# Patient Record
Sex: Female | Born: 1955 | Race: Asian | Hispanic: No | Marital: Married | Smoking: Never smoker
Health system: Southern US, Community
[De-identification: ages and names within clinical notes are randomized; demographics above are authoritative.]

## PROBLEM LIST (undated history)

## (undated) DIAGNOSIS — K8020 Calculus of gallbladder without cholecystitis without obstruction: Secondary | ICD-10-CM

## (undated) DIAGNOSIS — D509 Iron deficiency anemia, unspecified: Secondary | ICD-10-CM

## (undated) DIAGNOSIS — R718 Other abnormality of red blood cells: Secondary | ICD-10-CM

## (undated) DIAGNOSIS — E785 Hyperlipidemia, unspecified: Secondary | ICD-10-CM

## (undated) DIAGNOSIS — E119 Type 2 diabetes mellitus without complications: Secondary | ICD-10-CM

## (undated) DIAGNOSIS — M159 Polyosteoarthritis, unspecified: Secondary | ICD-10-CM

## (undated) DIAGNOSIS — R200 Anesthesia of skin: Secondary | ICD-10-CM

## (undated) DIAGNOSIS — R7303 Prediabetes: Secondary | ICD-10-CM

## (undated) HISTORY — DX: Hyperlipidemia, unspecified: E78.5

## (undated) HISTORY — DX: Prediabetes: R73.03

## (undated) HISTORY — PX: TUBAL LIGATION: SHX77

## (undated) HISTORY — DX: Iron deficiency anemia, unspecified: D50.9

## (undated) HISTORY — DX: Polyosteoarthritis, unspecified: M15.9

---
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
## 2015-05-16 ENCOUNTER — Encounter: Payer: Self-pay | Admitting: Family Medicine

## 2015-05-16 ENCOUNTER — Ambulatory Visit (INDEPENDENT_AMBULATORY_CARE_PROVIDER_SITE_OTHER): Payer: 59 | Admitting: Family Medicine

## 2015-05-16 VITALS — BP 110/72 | HR 75 | Temp 97.5°F | Resp 16 | Ht 60.0 in | Wt 170.3 lb

## 2015-05-16 DIAGNOSIS — M159 Polyosteoarthritis, unspecified: Secondary | ICD-10-CM | POA: Diagnosis not present

## 2015-05-16 DIAGNOSIS — Z9181 History of falling: Secondary | ICD-10-CM | POA: Diagnosis not present

## 2015-05-16 DIAGNOSIS — E669 Obesity, unspecified: Secondary | ICD-10-CM

## 2015-05-16 DIAGNOSIS — E785 Hyperlipidemia, unspecified: Secondary | ICD-10-CM

## 2015-05-16 DIAGNOSIS — D509 Iron deficiency anemia, unspecified: Secondary | ICD-10-CM

## 2015-05-16 DIAGNOSIS — Z6833 Body mass index (BMI) 33.0-33.9, adult: Secondary | ICD-10-CM | POA: Insufficient documentation

## 2015-05-16 MED ORDER — PRAVASTATIN SODIUM 20 MG PO TABS: 20.0000 mg | ORAL_TABLET | Freq: Every day | ORAL | Status: DC

## 2015-05-16 NOTE — Patient Instructions (Signed)
Fat and Cholesterol Control Diet Fat and cholesterol levels in your blood and organs are influenced by your diet. High levels of fat and cholesterol may lead to diseases of the heart, small and large blood vessels, gallbladder, liver, and pancreas. CONTROLLING FAT AND CHOLESTEROL WITH DIET Although exercise and lifestyle factors are important, your diet is key. That is because certain foods are known to raise cholesterol and others to lower it. The goal is to balance foods for their effect on cholesterol and more importantly, to replace saturated and trans fat with other types of fat, such as monounsaturated fat, polyunsaturated fat, and omega-3 fatty acids. On average, a person should consume no more than 15 to 17 g of saturated fat daily. Saturated and trans fats are considered "bad" fats, and they will raise LDL cholesterol. Saturated fats are primarily found in animal products such as meats, butter, and cream. However, that does not mean you need to give up all your favorite foods. Today, there are good tasting, low-fat, low-cholesterol substitutes for most of the things you like to eat. Choose low-fat or nonfat alternatives. Choose round or loin cuts of red meat. These types of cuts are lowest in fat and cholesterol. Chicken (without the skin), fish, veal, and ground turkey breast are great choices. Eliminate fatty meats, such as hot dogs and salami. Even shellfish have little or no saturated fat. Have a 3 oz (85 g) portion when you eat lean meat, poultry, or fish. Trans fats are also called "partially hydrogenated oils." They are oils that have been scientifically manipulated so that they are solid at room temperature resulting in a longer shelf life and improved taste and texture of foods in which they are added. Trans fats are found in stick margarine, some tub margarines, cookies, crackers, and baked goods.  When baking and cooking, oils are a great substitute for butter. The monounsaturated oils are  especially beneficial since it is believed they lower LDL and raise HDL. The oils you should avoid entirely are saturated tropical oils, such as coconut and palm.  Remember to eat a lot from food groups that are naturally free of saturated and trans fat, including fish, fruit, vegetables, beans, grains (barley, rice, couscous, bulgur wheat), and pasta (without cream sauces).  IDENTIFYING FOODS THAT LOWER FAT AND CHOLESTEROL  Soluble fiber may lower your cholesterol. This type of fiber is found in fruits such as apples, vegetables such as broccoli, potatoes, and carrots, legumes such as beans, peas, and lentils, and grains such as barley. Foods fortified with plant sterols (phytosterol) may also lower cholesterol. You should eat at least 2 g per day of these foods for a cholesterol lowering effect.  Read package labels to identify low-saturated fats, trans fat free, and low-fat foods at the supermarket. Select cheeses that have only 2 to 3 g saturated fat per ounce. Use a heart-healthy tub margarine that is free of trans fats or partially hydrogenated oil. When buying baked goods (cookies, crackers), avoid partially hydrogenated oils. Breads and muffins should be made from whole grains (whole-wheat or whole oat flour, instead of "flour" or "enriched flour"). Buy non-creamy canned soups with reduced salt and no added fats.  FOOD PREPARATION TECHNIQUES  Never deep-fry. If you must fry, either stir-fry, which uses very little fat, or use non-stick cooking sprays. When possible, broil, bake, or roast meats, and steam vegetables. Instead of putting butter or margarine on vegetables, use lemon and herbs, applesauce, and cinnamon (for squash and sweet potatoes). Use nonfat   yogurt, salsa, and low-fat dressings for salads.  LOW-SATURATED FAT / LOW-FAT FOOD SUBSTITUTES Meats / Saturated Fat (g)  Avoid: Steak, marbled (3 oz/85 g) / 11 g  Choose: Steak, lean (3 oz/85 g) / 4 g  Avoid: Hamburger (3 oz/85 g) / 7  g  Choose: Hamburger, lean (3 oz/85 g) / 5 g  Avoid: Ham (3 oz/85 g) / 6 g  Choose: Ham, lean cut (3 oz/85 g) / 2.4 g  Avoid: Chicken, with skin, dark meat (3 oz/85 g) / 4 g  Choose: Chicken, skin removed, dark meat (3 oz/85 g) / 2 g  Avoid: Chicken, with skin, light meat (3 oz/85 g) / 2.5 g  Choose: Chicken, skin removed, light meat (3 oz/85 g) / 1 g Dairy / Saturated Fat (g)  Avoid: Whole milk (1 cup) / 5 g  Choose: Low-fat milk, 2% (1 cup) / 3 g  Choose: Low-fat milk, 1% (1 cup) / 1.5 g  Choose: Skim milk (1 cup) / 0.3 g  Avoid: Hard cheese (1 oz/28 g) / 6 g  Choose: Skim milk cheese (1 oz/28 g) / 2 to 3 g  Avoid: Cottage cheese, 4% fat (1 cup) / 6.5 g  Choose: Low-fat cottage cheese, 1% fat (1 cup) / 1.5 g  Avoid: Ice cream (1 cup) / 9 g  Choose: Sherbet (1 cup) / 2.5 g  Choose: Nonfat frozen yogurt (1 cup) / 0.3 g  Choose: Frozen fruit bar / trace  Avoid: Whipped cream (1 tbs) / 3.5 g  Choose: Nondairy whipped topping (1 tbs) / 1 g Condiments / Saturated Fat (g)  Avoid: Mayonnaise (1 tbs) / 2 g  Choose: Low-fat mayonnaise (1 tbs) / 1 g  Avoid: Butter (1 tbs) / 7 g  Choose: Extra light margarine (1 tbs) / 1 g  Avoid: Coconut oil (1 tbs) / 11.8 g  Choose: Olive oil (1 tbs) / 1.8 g  Choose: Corn oil (1 tbs) / 1.7 g  Choose: Safflower oil (1 tbs) / 1.2 g  Choose: Sunflower oil (1 tbs) / 1.4 g  Choose: Soybean oil (1 tbs) / 2.4 g  Choose: Canola oil (1 tbs) / 1 g Document Released: 11/03/2005 Document Revised: 02/28/2013 Document Reviewed: 02/01/2014 [employer] Patient Information 2015 [employer]. This information is not intended to replace advice given to you by your health care provider. Make sure you discuss any questions you have with your health care provider.  

## 2015-05-16 NOTE — Progress Notes (Signed)
Name: Kristine Alexander   MRN: [id number]    DOB: 04-Jul-1956   Date:05/16/2015       Progress Note  Subjective  Chief Complaint  Chief Complaint  Patient presents with  . Establish Care  . Referral    HPI  Mrs. Kristine Alexander is here today along with her daughter to establish care. She is of [place] background but has been living in the [place] nearly [place] years. She lives with her daughters, son in laws and grand children. She continues to be active in her house hold chores, still cooking for all of her family. Despite her generalized osteoarthritic pain located in back, hands, knees, feet she continues to clean and cook and tend to her grandchildren and family which she enjoys doing. Recently she has been seeing a ChiropodistChiropracter and needs an official referral to continue seeing him. She uses Meloxicam 15mg  one a day to help with her joint pain which she states helps. Otherwise she was recently diagnosed with iron deficiency and high cholesterol by her previous provider and has started daily iron therapy but not cholesterol medication.   Past Medical History  Diagnosis Date  . Osteoarthritis of multiple joints   . Hyperlipidemia   . Iron deficiency anemia     History reviewed. No pertinent past surgical history.  Family History  Problem Relation Age of Onset  . Hyperlipidemia Mother   . Hyperlipidemia Father   . Hyperlipidemia Sister   . Diabetes Brother   . Hyperlipidemia Brother     History   Social History  . Marital Status: Married    Spouse Name: N/A  . Number of Children: N/A  . Years of Education: N/A   Occupational History  . Not on file.   Social History Main Topics  . Smoking status: Never Smoker   . Smokeless tobacco: Not on file  . Alcohol Use: No  . Drug Use: No  . Sexual Activity: No   Other Topics Concern  . Not on file   Social History Narrative  . No narrative on file     Current outpatient prescriptions:  .  ferrous sulfate 325 (65 FE)  MG tablet, Take 325 mg by mouth daily with breakfast., Disp: , Rfl:  .  meloxicam (MOBIC) 15 MG tablet, Take 15 mg by mouth daily., Disp: , Rfl:  .  pravastatin (PRAVACHOL) 20 MG tablet, Take 1 tablet (20 mg total) by mouth at bedtime., Disp: 90 tablet, Rfl: 1  No Known Allergies   ROS  CONSTITUTIONAL: No significant weight changes, fever, chills, weakness or fatigue.  HEENT:  - Eyes: No visual changes.  - Ears: No auditory changes. No pain.  - Nose: No sneezing, congestion, runny nose. - Throat: No sore throat. No changes in swallowing. SKIN: No rash or itching.  CARDIOVASCULAR: No chest pain, chest pressure or chest discomfort. No palpitations or edema.  RESPIRATORY: No shortness of breath, cough or sputum.  GASTROINTESTINAL: No anorexia, nausea, vomiting. No changes in bowel habits. No abdominal pain or blood.  GENITOURINARY: No dysuria. No frequency. No discharge.  NEUROLOGICAL: No headache, dizziness, syncope, paralysis, ataxia, numbness or tingling in the extremities. No memory changes. No change in bowel or bladder control.  MUSCULOSKELETAL: Yes joint pain. No muscle pain. HEMATOLOGIC: No anemia, bleeding or bruising.  LYMPHATICS: No enlarged lymph nodes.  PSYCHIATRIC: No change in mood. No change in sleep pattern.  ENDOCRINOLOGIC: No reports of sweating, cold or heat intolerance. No polyuria or polydipsia.   Objective  Filed Vitals:   05/16/15 1537  BP: 110/72  Pulse: 75  Temp: 97.5 F (36.4 C)  TempSrc: Oral  Resp: 16  Height: 5' (1.524 m)  Weight: 170 lb 4.8 oz (77.248 kg)  SpO2: 96%   Body mass index is 33.26 kg/(m^2).  Physical Exam  Constitutional: Patient appears well-developed and well-nourished. In no distress.  HEENT:  - Head: Normocephalic and atraumatic.  - Ears: Bilateral TMs gray, no erythema or effusion - Nose: Nasal mucosa moist - Mouth/Throat: Oropharynx is clear and moist. No tonsillar hypertrophy or erythema. No post nasal drainage.  -  Eyes: Conjunctivae clear, EOM movements normal. PERRLA. No scleral icterus.  Neck: Normal range of motion. Neck supple. No JVD present. No thyromegaly present.  Cardiovascular: Normal rate, regular rhythm with a 2/6 SEM. Pulmonary/Chest: Effort normal and breath sounds normal. No respiratory distress. Musculoskeletal: Normal range of motion bilateral UE and LE, no joint effusions. Peripheral vascular: Bilateral LE no edema. Neurological: CN II-XII grossly intact with no focal deficits. Alert and oriented to person, place, and time. Coordination, balance, strength, speech and gait are normal.  Skin: Skin is warm and dry. No rash noted. No erythema.  Psychiatric: Patient has a normal mood and affect. Behavior is normal in office today. Judgment and thought content normal in office today.   Assessment & Plan  1. Obesity, Class I, BMI 30-34.9 The patient has been counseled on their higher than normal BMI.  They have verbally expressed understanding their increased risk for other diseases.  In efforts to meet a better target BMI goal the patient has been counseled on lifestyle, diet and exercise modification tactics. Start with moderate intensity aerobic exercise (walking, jogging, elliptical, swimming, group or individual sports, hiking) at least a day at least 4 days a week and increase intensity, duration, frequency as tolerated. Diet should include well balance fresh fruits and vegetables avoiding processed foods, carbohydrates and sugars. Drink at least 8oz 10 glasses a day avoiding sodas, sugary fruit drinks, sweetened tea. Check weight on a reliable scale daily and monitor weight loss progress daily. Consider investing in mobile phone apps that will help keep track of weight loss goals.   2. Iron deficiency anemia Continue daily iron.   - Ferritin - Iron - Iron and TIBC - CBC with Differential/Platelet  3. Hyperlipidemia LDL goal <100 The patient has been counseled on the proper  use, side effects and potential interactions of the new medication. Patient encouraged to review the side effects and safety profile pamphlet provided with the prescription from the pharmacy as well as request counseling from the pharmacy team as needed.   - pravastatin (PRAVACHOL) 20 MG tablet; Take 1 tablet (20 mg total) by mouth at bedtime.  Dispense: 90 tablet; Refill: 1  4. Osteoarthrosis, generalized, multiple joints Will rule out RA at next lab work.  - Ambulatory referral to Chiropractic  5. History of fall Likely complicated by OA of multiple joints. Will eventually need to slow down with house hold tasks.

## 2015-05-17 LAB — CBC WITH DIFFERENTIAL/PLATELET
BASOS ABS: 0 10*3/uL (ref 0.0–0.2)
Basos: 0 %
EOS (ABSOLUTE): 0.2 10*3/uL (ref 0.0–0.4)
Eos: 2 %
HEMATOCRIT: 35.3 % (ref 34.0–46.6)
HEMOGLOBIN: 11.5 g/dL (ref 11.1–15.9)
Immature Grans (Abs): 0 10*3/uL (ref 0.0–0.1)
Immature Granulocytes: 0 %
Lymphocytes Absolute: 3.1 10*3/uL (ref 0.7–3.1)
Lymphs: 36 %
MCH: 25.6 pg — ABNORMAL LOW (ref 26.6–33.0)
MCHC: 32.6 g/dL (ref 31.5–35.7)
MCV: 78 fL — ABNORMAL LOW (ref 79–97)
MONOCYTES: 8 %
MONOS ABS: 0.7 10*3/uL (ref 0.1–0.9)
NEUTROS ABS: 4.7 10*3/uL (ref 1.4–7.0)
Neutrophils: 54 %
PLATELETS: 262 10*3/uL (ref 150–379)
RBC: 4.5 x10E6/uL (ref 3.77–5.28)
RDW: 15.2 % (ref 12.3–15.4)
WBC: 8.7 10*3/uL (ref 3.4–10.8)

## 2015-05-17 LAB — IRON AND TIBC
IRON SATURATION: 22 % (ref 15–55)
IRON: 71 ug/dL (ref 27–159)
Total Iron Binding Capacity: 321 ug/dL (ref 250–450)
UIBC: 250 ug/dL (ref 131–425)

## 2015-05-17 LAB — FERRITIN: Ferritin: 71 ng/mL (ref 15–150)

## 2015-09-18 ENCOUNTER — Ambulatory Visit
Admission: RE | Admit: 2015-09-18 | Discharge: 2015-09-18 | Disposition: A | Discharge status: Home / Self Care | Payer: 59 | Source: Ambulatory Visit | Attending: Family Medicine | Admitting: Family Medicine

## 2015-09-18 ENCOUNTER — Ambulatory Visit (INDEPENDENT_AMBULATORY_CARE_PROVIDER_SITE_OTHER): Payer: 59 | Admitting: Family Medicine

## 2015-09-18 ENCOUNTER — Encounter: Payer: Self-pay | Admitting: Family Medicine

## 2015-09-18 VITALS — BP 108/56 | HR 97 | Temp 97.5°F | Resp 16 | Wt 170.8 lb

## 2015-09-18 DIAGNOSIS — M545 Low back pain, unspecified: Secondary | ICD-10-CM | POA: Insufficient documentation

## 2015-09-18 DIAGNOSIS — R011 Cardiac murmur, unspecified: Secondary | ICD-10-CM

## 2015-09-18 DIAGNOSIS — Z1231 Encounter for screening mammogram for malignant neoplasm of breast: Secondary | ICD-10-CM

## 2015-09-18 DIAGNOSIS — M159 Polyosteoarthritis, unspecified: Secondary | ICD-10-CM

## 2015-09-18 DIAGNOSIS — E785 Hyperlipidemia, unspecified: Secondary | ICD-10-CM | POA: Diagnosis not present

## 2015-09-18 DIAGNOSIS — D509 Iron deficiency anemia, unspecified: Secondary | ICD-10-CM | POA: Diagnosis not present

## 2015-09-18 DIAGNOSIS — Z0001 Encounter for general adult medical examination with abnormal findings: Secondary | ICD-10-CM

## 2015-09-18 DIAGNOSIS — Z0000 Encounter for general adult medical examination without abnormal findings: Secondary | ICD-10-CM | POA: Insufficient documentation

## 2015-09-18 NOTE — Progress Notes (Signed)
Name: Kristine Alexander   MRN: [id number]    DOB: [birth year]   Date:09/18/2015       Progress Note  Subjective  Chief Complaint  Chief Complaint  Patient presents with  . Annual Exam    HPI  Patient is here today for a Complete Female Physical Exam:  The patient has usual complaint of back pain, shoulder pain, hip pain for which she takes Meloxicam 15 mg a day. This was previously effective but not as much any more. Daughter states she also complains of being tired and gets swelling in her lower extremities at times. Diet is not well balanced. Taking statin medication, iron supplement and meloxicam as instructed. In general does not exercise regularly but still prepares meals for family and does house work. Sees dentist regularly and addresses vision concerns with ophthalmologist if applicable. In regards to sexual activity the patient is not currently sexually active. Currently is not concerned about exposure to any STDs. Declines to have pelvic exam. Menstrual history is positive for menopause.   Past Medical History  Diagnosis Date  . Osteoarthritis of multiple joints   . Hyperlipidemia   . Iron deficiency anemia     History reviewed. No pertinent past surgical history.  Family History  Problem Relation Age of Onset  . Hyperlipidemia Mother   . Hyperlipidemia Father   . Hyperlipidemia Sister   . Diabetes Brother   . Hyperlipidemia Brother     Social History   Social History  . Marital Status: Married    Spouse Name: N/A  . Number of Children: N/A  . Years of Education: N/A   Occupational History  . Not on file.   Social History Main Topics  . Smoking status: Never Smoker   . Smokeless tobacco: Not on file  . Alcohol Use: No  . Drug Use: No  . Sexual Activity: No   Other Topics Concern  . Not on file   Social History Narrative     Current outpatient prescriptions:  .  ferrous sulfate 325 (65 FE) MG tablet, Take 325 mg by mouth daily with breakfast., Disp:  , Rfl:  .  meloxicam (MOBIC) 15 MG tablet, Take 15 mg by mouth daily., Disp: , Rfl:  .  pravastatin (PRAVACHOL) 20 MG tablet, Take 1 tablet (20 mg total) by mouth at bedtime., Disp: 90 tablet, Rfl: 1  No Known Allergies  ROS  CONSTITUTIONAL: No significant weight changes, fever, chills, weakness. Yes fatigue.  HEENT:  - Eyes: No visual changes.  - Ears: No auditory changes. No pain.  - Nose: No sneezing, congestion, runny nose. - Throat: No sore throat. No changes in swallowing. SKIN: No rash or itching.  CARDIOVASCULAR: No chest pain, chest pressure or chest discomfort. No palpitations. Yes intermittent edema.  RESPIRATORY: No shortness of breath, cough or sputum.  GASTROINTESTINAL: No anorexia, nausea, vomiting. No changes in bowel habits. No abdominal pain or blood.  GENITOURINARY: No dysuria. No frequency. No discharge.  NEUROLOGICAL: No headache, dizziness, syncope, paralysis, ataxia, numbness or tingling in the extremities. No memory changes. No change in bowel or bladder control.  MUSCULOSKELETAL: Yes joint pain. No muscle pain. HEMATOLOGIC: No anemia, bleeding or bruising.  LYMPHATICS: No enlarged lymph nodes.  PSYCHIATRIC: No change in mood. No change in sleep pattern.  ENDOCRINOLOGIC: No reports of sweating, cold or heat intolerance. No polyuria or polydipsia.   Objective  Filed Vitals:   09/18/15 1345  BP: 108/56  Pulse: 97  Temp: 97.5 F (36.4 C)  TempSrc: Oral  Resp: 16  Weight: 170 lb 12.8 oz (77.474 kg)  SpO2: 96%   Body mass index is 33.36 kg/(m^2).  Physical Exam  Constitutional: Patient appears well-developed and well-nourished. In no distress.  HEENT:  - Head: Normocephalic and atraumatic.  - Ears: Bilateral TMs gray, no erythema or effusion - Nose: Nasal mucosa moist - Mouth/Throat: Oropharynx is clear and moist. No tonsillar hypertrophy or erythema. No post nasal drainage.  - Eyes: Conjunctivae clear, EOM movements normal. PERRLA. No scleral  icterus.  Neck: Normal range of motion. Neck supple. No JVD present. No thyromegaly present.  Cardiovascular: Normal rate, regular rhythm with a 3/6 SEM best heard patient's right of the sternum. Pulmonary/Chest: Effort normal and breath sounds normal. No respiratory distress. Abdominal: Soft. Bowel sounds are normal, no distension. There is no tenderness. no masses BREAST: Bilateral breast exam normal with no masses, skin changes or nipple discharge FEMALE GENITALIA: exam declined by patient RECTAL: exam declined by patient Musculoskeletal: Normal range of motion bilateral UE and LE, no joint effusions. Peripheral vascular: Bilateral LE no edema. Neurological: CN II-XII grossly intact with no focal deficits. Alert and oriented to person, place, and time. Coordination, balance, strength, speech and gait are normal.  Skin: Skin is warm and dry. No rash noted. No erythema.  Psychiatric: Patient has a normal mood and affect. Behavior is normal in office today. Judgment and thought content normal in office today.   Assessment & Plan   1. Annual physical exam PAP testing declined. Discussed risk and benefits with daughter and patient.   2. Encounter for screening mammogram for malignant neoplasm of breast  - MM Digital Screening; Future  3. Osteoarthrosis, generalized, multiple joints  - DG Lumbar Spine Complete; Future  4. Left-sided low back pain without sciatica  - DG Lumbar Spine Complete; Future  5. Heart murmur New finding. Recommended cardiology evaluation.  - Comprehensive metabolic panel - TSH - Ambulatory referral Cardiology  6. Hyperlipidemia LDL goal <100 Recheck FLP.   - Lipid panel  7. Iron deficiency anemia Recheck lab work.  - CBC with Differential/Platelet - Comprehensive metabolic panel - Ferritin - Iron - Iron and TIBC

## 2015-09-20 LAB — CBC WITH DIFFERENTIAL/PLATELET
BASOS: 0 %
Basophils Absolute: 0 10*3/uL (ref 0.0–0.2)
EOS (ABSOLUTE): 0.1 10*3/uL (ref 0.0–0.4)
Eos: 2 %
Hematocrit: 35.2 % (ref 34.0–46.6)
Hemoglobin: 11.9 g/dL (ref 11.1–15.9)
IMMATURE GRANS (ABS): 0 10*3/uL (ref 0.0–0.1)
IMMATURE GRANULOCYTES: 0 %
LYMPHS: 38 %
Lymphocytes Absolute: 2.6 10*3/uL (ref 0.7–3.1)
MCH: 26 pg — AB (ref 26.6–33.0)
MCHC: 33.8 g/dL (ref 31.5–35.7)
MCV: 77 fL — ABNORMAL LOW (ref 79–97)
MONOCYTES: 6 %
Monocytes Absolute: 0.4 10*3/uL (ref 0.1–0.9)
NEUTROS PCT: 54 %
Neutrophils Absolute: 3.6 10*3/uL (ref 1.4–7.0)
Platelets: 291 10*3/uL (ref 150–379)
RBC: 4.57 x10E6/uL (ref 3.77–5.28)
RDW: 14.4 % (ref 12.3–15.4)
WBC: 6.8 10*3/uL (ref 3.4–10.8)

## 2015-09-20 LAB — COMPREHENSIVE METABOLIC PANEL
A/G RATIO: 1.2 (ref 1.1–2.5)
ALBUMIN: 4 g/dL (ref 3.5–5.5)
ALT: 20 IU/L (ref 0–32)
AST: 21 IU/L (ref 0–40)
Alkaline Phosphatase: 58 IU/L (ref 39–117)
BUN / CREAT RATIO: 17 (ref 9–23)
BUN: 11 mg/dL (ref 6–24)
Bilirubin Total: 0.3 mg/dL (ref 0.0–1.2)
CALCIUM: 9.3 mg/dL (ref 8.7–10.2)
CO2: 24 mmol/L (ref 18–29)
CREATININE: 0.63 mg/dL (ref 0.57–1.00)
Chloride: 100 mmol/L (ref 97–106)
GFR, EST AFRICAN AMERICAN: 114 mL/min/{1.73_m2} (ref >59)
GFR, EST NON AFRICAN AMERICAN: 98 mL/min/{1.73_m2} (ref >59)
GLOBULIN, TOTAL: 3.4 g/dL (ref 1.5–4.5)
Glucose: 92 mg/dL (ref 65–99)
POTASSIUM: 4.4 mmol/L (ref 3.5–5.2)
SODIUM: 139 mmol/L (ref 136–144)
TOTAL PROTEIN: 7.4 g/dL (ref 6.0–8.5)

## 2015-09-20 LAB — LIPID PANEL
CHOLESTEROL TOTAL: 178 mg/dL (ref 100–199)
Chol/HDL Ratio: 3.8 ratio units (ref 0.0–4.4)
HDL: 47 mg/dL (ref >39)
LDL Calculated: 118 mg/dL — ABNORMAL HIGH (ref 0–99)
TRIGLYCERIDES: 65 mg/dL (ref 0–149)
VLDL Cholesterol Cal: 13 mg/dL (ref 5–40)

## 2015-09-20 LAB — IRON AND TIBC
IRON SATURATION: 22 % (ref 15–55)
IRON: 70 ug/dL (ref 27–159)
Total Iron Binding Capacity: 318 ug/dL (ref 250–450)
UIBC: 248 ug/dL (ref 131–425)

## 2015-09-20 LAB — TSH: TSH: 3.06 u[IU]/mL (ref 0.450–4.500)

## 2015-09-20 LAB — FERRITIN: FERRITIN: 103 ng/mL (ref 15–150)

## 2015-09-21 ENCOUNTER — Telehealth: Payer: Self-pay

## 2015-09-21 NOTE — Telephone Encounter (Signed)
Please call the daughter with her moms results when you get time. Thanks

## 2015-09-21 NOTE — Telephone Encounter (Signed)
Daughter returned my call and labs were reviewed after she verified her mom's date of birth. A copy of the labs were mailed to their home address.

## 2015-09-21 NOTE — Telephone Encounter (Signed)
Tried to call her daughter but the line was busy. I was not able to leave a message.

## 2015-10-02 DIAGNOSIS — R600 Localized edema: Secondary | ICD-10-CM

## 2015-10-03 DIAGNOSIS — R0602 Shortness of breath: Secondary | ICD-10-CM

## 2015-12-26 ENCOUNTER — Ambulatory Visit (INDEPENDENT_AMBULATORY_CARE_PROVIDER_SITE_OTHER): Payer: BLUE CROSS/BLUE SHIELD | Admitting: Family Medicine

## 2015-12-26 ENCOUNTER — Encounter: Payer: Self-pay | Admitting: Family Medicine

## 2015-12-26 VITALS — BP 114/82 | HR 89 | Temp 98.1°F | Resp 16 | Wt 171.0 lb

## 2015-12-26 DIAGNOSIS — J40 Bronchitis, not specified as acute or chronic: Secondary | ICD-10-CM | POA: Diagnosis not present

## 2015-12-26 DIAGNOSIS — Z8709 Personal history of other diseases of the respiratory system: Secondary | ICD-10-CM | POA: Insufficient documentation

## 2015-12-26 DIAGNOSIS — Z8619 Personal history of other infectious and parasitic diseases: Secondary | ICD-10-CM

## 2015-12-26 DIAGNOSIS — J209 Acute bronchitis, unspecified: Secondary | ICD-10-CM | POA: Insufficient documentation

## 2015-12-26 MED ORDER — LEVOFLOXACIN 750 MG PO TABS: 750.0000 mg | ORAL_TABLET | Freq: Every day | ORAL | Status: DC

## 2015-12-26 MED ORDER — BENZONATATE 200 MG PO CAPS: 200.0000 mg | ORAL_CAPSULE | Freq: Three times a day (TID) | ORAL | Status: DC | PRN

## 2015-12-26 NOTE — Patient Instructions (Signed)
1) Call if symptoms not getting better in 1 week, Doctor will order chest x-ray then

## 2015-12-26 NOTE — Progress Notes (Signed)
Name: Kristine Alexander   MRN: [id number]    DOB: [birth year]   Date:12/26/2015       Progress Note  Subjective  Chief Complaint  Chief Complaint  Patient presents with  . URI    HPI  Patient is here today along with daughter who is caretaker and interpreter with concerns regarding the following symptoms sore throat, congestion, sneezing, ear pressure, productive cough and low grade fevers that started 3 weeks ago.  Associated with chills, sweats, fatigue and anorexia.  Has tried the following home remedies: Went to urgent care and she was diagnosed with the flu, given tamiflu but her respiratory symptoms persist. Fevers gone now.    Past Medical History  Diagnosis Date  . Osteoarthritis of multiple joints   . Hyperlipidemia   . Iron deficiency anemia     Social History  Substance Use Topics  . Smoking status: Never Smoker   . Smokeless tobacco: Not on file  . Alcohol Use: No     Current outpatient prescriptions:  .  ferrous sulfate 325 (65 FE) MG tablet, Take 325 mg by mouth daily with breakfast., Disp: , Rfl:  .  meloxicam (MOBIC) 15 MG tablet, Take 15 mg by mouth daily., Disp: , Rfl:  .  pravastatin (PRAVACHOL) 20 MG tablet, Take 1 tablet (20 mg total) by mouth at bedtime., Disp: 90 tablet, Rfl: 1  No Known Allergies  ROS  Positive for fatigue, nasal congestion, sinus pressure, ear fullness, cough as mentioned in HPI, otherwise all systems reviewed and are negative.  Objective  Filed Vitals:   12/26/15 1607  BP: 114/82  Pulse: 89  Temp: 98.1 F (36.7 C)  TempSrc: Oral  Resp: 16  Weight: 171 lb (77.565 kg)  SpO2: 96%   Body mass index is 33.4 kg/(m^2).   Physical Exam  Constitutional: Patient is obese and well-nourished. In no acute distress but does appear to be fatigued from acute illness. HEENT:  - Head: Normocephalic and atraumatic.  - Ears: RIGHT TM bulging with minimal clear exudate, LEFT TM bulging with minimal clear exudate.  - Nose: Nasal mucosa  boggy and congested.  - Mouth/Throat: Oropharynx is moist with slight erythema of bilateral tonsils without hypertrophy or exudates. Post nasal drainage present.  - Eyes: Conjunctivae clear, EOM movements normal. PERRLA. No scleral icterus.  Neck: Normal range of motion. Neck supple. No JVD present. No thyromegaly present. No local lymphadenopathy. Cardiovascular: Regular rate, regular rhythm with no murmurs heard.  Pulmonary/Chest: Effort normal and breath sounds coarse with reduced tidal volume and no rhonchi or wheezing. Musculoskeletal: Normal range of motion bilateral UE and LE, no joint effusions. Skin: Skin is warm and dry. No rash noted. Psychiatric: Patient has a normal mood and affect. Behavior is normal in office today. Judgment and thought content normal in office today.   Assessment & Plan  1. Bronchitis with bronchospasm Treat as post influenza pneumonia.  - CBC with Differential/Platelet - levofloxacin (LEVAQUIN) 750 MG tablet; Take 1 tablet (750 mg total) by mouth daily.  Dispense: 10 tablet; Refill: 0 - benzonatate (TESSALON) 200 MG capsule; Take 1 capsule (200 mg total) by mouth 3 (three) times daily as needed for cough.  Dispense: 30 capsule; Refill: 0  2. History of influenza I recommended CXR, declined by patient and daughter but they were willing to get CBC.

## 2015-12-27 LAB — CBC WITH DIFFERENTIAL/PLATELET
BASOS: 0 %
Basophils Absolute: 0 10*3/uL (ref 0.0–0.2)
EOS (ABSOLUTE): 0.1 10*3/uL (ref 0.0–0.4)
EOS: 2 %
HEMATOCRIT: 34.9 % (ref 34.0–46.6)
HEMOGLOBIN: 11.9 g/dL (ref 11.1–15.9)
IMMATURE GRANS (ABS): 0 10*3/uL (ref 0.0–0.1)
IMMATURE GRANULOCYTES: 0 %
LYMPHS: 50 %
Lymphocytes Absolute: 3.6 10*3/uL — ABNORMAL HIGH (ref 0.7–3.1)
MCH: 26.3 pg — ABNORMAL LOW (ref 26.6–33.0)
MCHC: 34.1 g/dL (ref 31.5–35.7)
MCV: 77 fL — ABNORMAL LOW (ref 79–97)
Monocytes Absolute: 0.5 10*3/uL (ref 0.1–0.9)
Monocytes: 6 %
NEUTROS PCT: 42 %
Neutrophils Absolute: 3 10*3/uL (ref 1.4–7.0)
Platelets: 234 10*3/uL (ref 150–379)
RBC: 4.52 x10E6/uL (ref 3.77–5.28)
RDW: 14.7 % (ref 12.3–15.4)
WBC: 7.3 10*3/uL (ref 3.4–10.8)

## 2016-02-01 ENCOUNTER — Ambulatory Visit (INDEPENDENT_AMBULATORY_CARE_PROVIDER_SITE_OTHER): Payer: BLUE CROSS/BLUE SHIELD | Admitting: Family Medicine

## 2016-02-01 ENCOUNTER — Encounter: Payer: Self-pay | Admitting: Family Medicine

## 2016-02-01 VITALS — BP 106/68 | HR 93 | Temp 98.9°F | Resp 18 | Ht 60.0 in | Wt 169.1 lb

## 2016-02-01 DIAGNOSIS — A084 Viral intestinal infection, unspecified: Secondary | ICD-10-CM | POA: Diagnosis not present

## 2016-02-01 DIAGNOSIS — R110 Nausea: Secondary | ICD-10-CM | POA: Diagnosis not present

## 2016-02-01 DIAGNOSIS — R509 Fever, unspecified: Secondary | ICD-10-CM | POA: Diagnosis not present

## 2016-02-01 LAB — POCT INFLUENZA A/B

## 2016-02-01 MED ORDER — ONDANSETRON HCL 4 MG PO TABS: 4.0000 mg | ORAL_TABLET | Freq: Three times a day (TID) | ORAL | Status: DC | PRN

## 2016-02-01 NOTE — Patient Instructions (Signed)

## 2016-02-01 NOTE — Progress Notes (Signed)
Name: Kristine Alexander   MRN: [id number]    DOB: November 06, 1956   Date:02/01/2016       Progress Note  Subjective  Chief Complaint  Chief Complaint  Patient presents with  . Nausea  . Emesis    Started Wed night ended yesterday  . Fever    yesterday 100.0    HPI  Kristine Alexander is a 60 year old female who is accompanied by her daughter today for acute symptoms. If you recall she had influenza end of Jan early Feb of this year and [facility] and then followed up with me on 12/26/15 because she was still having respiratory symptoms. I placed her on Levaquin and tessalon perls. I recommend CXR at the time but they declined. These symptoms resolved well with Levaquin per daughter and patient today.  Patient is here today with concerns regarding the following symptoms nausea, vomiting (2 days ago, now resolved), fevers that started 2 days ago.  Associated with fevers, chills and fatigue. Not associated with headache, rash, recent foreign travel. Has tried the following home remedies: ginger ale, bland diet. Children in the home had stomach virus as well confirmed by their pediatrician so Kristine has been following a similar bland diet.    Past Medical History  Diagnosis Date  . Osteoarthritis of multiple joints   . Hyperlipidemia   . Iron deficiency anemia     Social History  Substance Use Topics  . Smoking status: Never Smoker   . Smokeless tobacco: Not on file  . Alcohol Use: No     Current outpatient prescriptions:  .  ferrous sulfate 325 (65 FE) MG tablet, Take 325 mg by mouth daily with breakfast., Disp: , Rfl:  .  levofloxacin (LEVAQUIN) 750 MG tablet, Take 1 tablet (750 mg total) by mouth daily., Disp: 10 tablet, Rfl: 0 .  meloxicam (MOBIC) 15 MG tablet, Take 15 mg by mouth daily., Disp: , Rfl:  .  pravastatin (PRAVACHOL) 20 MG tablet, Take 1 tablet (20 mg total) by mouth at bedtime., Disp: 90 tablet, Rfl: 1 .  benzonatate (TESSALON) 200 MG capsule, Take 1  capsule (200 mg total) by mouth 3 (three) times daily as needed for cough. (Patient not taking: Reported on 02/01/2016), Disp: 30 capsule, Rfl: 0  No Known Allergies  ROS  Positive for fatigue, nausea as mentioned in HPI, otherwise all systems reviewed and are negative.  Objective  Filed Vitals:   02/01/16 1036  BP: 106/68  Pulse: 93  Temp: 98.9 F (37.2 C)  Resp: 18  Height: 5' (1.524 m)  Weight: 169 lb 1 oz (76.686 kg)  SpO2: 96%   Body mass index is 33.02 kg/(m^2).   Physical Exam  Constitutional: Patient remains obese. In no acute distress. HEENT:  - Head: Normocephalic and atraumatic.  - Ears: Right and Left TMs pearly grey with no effusion.  - Nose: Nasal mucosa boggy and congested.  - Mouth/Throat: Oropharynx is moist with slight erythema of bilateral tonsils without hypertrophy or exudates. Post nasal drainage present.  - Eyes: Conjunctivae clear, EOM movements normal. PERRLA. No scleral icterus.  Neck: Normal range of motion. Neck supple. No JVD present. No thyromegaly present. No local lymphadenopathy. Cardiovascular: Regular rate, regular rhythm with no murmurs heard.  Pulmonary/Chest: Effort normal and breath sounds clear in all lung fields.  Abdomen: Soft, obese, ND, Normal BS, no rebound or guarding but she does report some mild discomfort in the epigastric area. Musculoskeletal: Normal range of motion  bilateral UE and LE, no joint effusions. Skin: Skin is warm and dry. No rash noted. Psychiatric: Patient has a happy conversational mood and affect. Behavior is normal in office today. Judgment and thought content normal in office today.  Results for orders placed or performed in visit on 02/01/16 (from the past 24 hour(s))  POCT Influenza A/B     Status: Normal   Collection Time: 02/01/16 11:00 AM  Result Value Ref Range   Influenza A, POC  Negative   Influenza B, POC  Negative    Assessment & Plan  1. Fever, unspecified Resolved. Negative flu  swab.  - POCT Influenza A/B  2. Viral gastroenteritis Supportive therapy and hydration emphasized.   3. Nausea  - ondansetron (ZOFRAN) 4 MG tablet; Take 1-2 tablets (4-8 mg total) by mouth every 8 (eight) hours as needed for nausea or vomiting.  Dispense: 30 tablet; Refill: 0

## 2016-02-20 ENCOUNTER — Encounter: Payer: Self-pay | Admitting: *Deleted

## 2016-02-24 NOTE — H&P (Signed)
See scanned note.

## 2016-02-25 ENCOUNTER — Ambulatory Visit: Payer: BLUE CROSS/BLUE SHIELD | Admitting: Anesthesiology

## 2016-02-25 ENCOUNTER — Encounter: Payer: Self-pay | Admitting: *Deleted

## 2016-02-25 ENCOUNTER — Ambulatory Visit
Admission: RE | Admit: 2016-02-25 | Discharge: 2016-02-25 | Disposition: A | Discharge status: Home / Self Care | Payer: BLUE CROSS/BLUE SHIELD | Source: Ambulatory Visit | Attending: Ophthalmology | Admitting: Ophthalmology

## 2016-02-25 ENCOUNTER — Encounter
Admission: RE | Disposition: A | Discharge status: Home / Self Care | Payer: Self-pay | Source: Ambulatory Visit | Attending: Ophthalmology

## 2016-02-25 DIAGNOSIS — H269 Unspecified cataract: Secondary | ICD-10-CM | POA: Diagnosis present

## 2016-02-25 DIAGNOSIS — E7800 Pure hypercholesterolemia, unspecified: Secondary | ICD-10-CM | POA: Insufficient documentation

## 2016-02-25 DIAGNOSIS — H25041 Posterior subcapsular polar age-related cataract, right eye: Secondary | ICD-10-CM | POA: Insufficient documentation

## 2016-02-25 DIAGNOSIS — H2511 Age-related nuclear cataract, right eye: Secondary | ICD-10-CM | POA: Insufficient documentation

## 2016-02-25 DIAGNOSIS — Z9851 Tubal ligation status: Secondary | ICD-10-CM | POA: Diagnosis not present

## 2016-02-25 DIAGNOSIS — M1990 Unspecified osteoarthritis, unspecified site: Secondary | ICD-10-CM | POA: Diagnosis not present

## 2016-02-25 HISTORY — PX: CATARACT EXTRACTION W/PHACO: SHX586

## 2016-02-25 SURGERY — PHACOEMULSIFICATION, CATARACT, WITH IOL INSERTION
Anesthesia: Monitor Anesthesia Care | Site: Eye | Laterality: Right | Wound class: Clean

## 2016-02-25 MED ORDER — CYCLOPENTOLATE HCL 2 % OP SOLN
1.0000 [drp] | OPHTHALMIC | Status: AC
  Administered 2016-02-25 (×4): 1 [drp] via OPHTHALMIC

## 2016-02-25 MED ORDER — MOXIFLOXACIN HCL 0.5 % OP SOLN
OPHTHALMIC | Status: DC | PRN
  Administered 2016-02-25: 1 [drp] via OPHTHALMIC

## 2016-02-25 MED ORDER — PHENYLEPHRINE HCL 10 % OP SOLN
OPHTHALMIC | Status: AC
  Administered 2016-02-25: 1 [drp] via OPHTHALMIC
  Filled 2016-02-25: qty 5

## 2016-02-25 MED ORDER — CEFUROXIME OPHTHALMIC INJECTION 1 MG/0.1 ML
INJECTION | OPHTHALMIC | Status: AC
  Filled 2016-02-25: qty 0.1

## 2016-02-25 MED ORDER — SODIUM CHLORIDE 0.9 % IV SOLN
INTRAVENOUS | Status: DC
  Administered 2016-02-25: 08:00:00 via INTRAVENOUS

## 2016-02-25 MED ORDER — EPINEPHRINE HCL 1 MG/ML IJ SOLN
INTRAOCULAR | Status: DC | PRN
  Administered 2016-02-25: 1 mL via OPHTHALMIC

## 2016-02-25 MED ORDER — MOXIFLOXACIN HCL 0.5 % OP SOLN
1.0000 [drp] | OPHTHALMIC | Status: AC
  Administered 2016-02-25 (×3): 1 [drp] via OPHTHALMIC

## 2016-02-25 MED ORDER — LIDOCAINE HCL (PF) 4 % IJ SOLN
INTRAOCULAR | Status: DC | PRN
  Administered 2016-02-25: .5 mL

## 2016-02-25 MED ORDER — LIDOCAINE HCL (PF) 4 % IJ SOLN
INTRAMUSCULAR | Status: AC
  Filled 2016-02-25: qty 5

## 2016-02-25 MED ORDER — BUPIVACAINE HCL (PF) 0.75 % IJ SOLN
INTRAMUSCULAR | Status: DC | PRN
  Administered 2016-02-25: 4 mL via OPHTHALMIC

## 2016-02-25 MED ORDER — CEFUROXIME OPHTHALMIC INJECTION 1 MG/0.1 ML
INJECTION | OPHTHALMIC | Status: DC | PRN
  Administered 2016-02-25: .1 mL via INTRACAMERAL

## 2016-02-25 MED ORDER — MOXIFLOXACIN HCL 0.5 % OP SOLN
OPHTHALMIC | Status: AC
  Administered 2016-02-25: 1 [drp] via OPHTHALMIC
  Filled 2016-02-25: qty 3

## 2016-02-25 MED ORDER — HYALURONIDASE HUMAN 150 UNIT/ML IJ SOLN
INTRAMUSCULAR | Status: AC
  Filled 2016-02-25: qty 1

## 2016-02-25 MED ORDER — ALFENTANIL 500 MCG/ML IJ INJ
INJECTION | INTRAMUSCULAR | Status: DC | PRN
  Administered 2016-02-25: 500 ug via INTRAVENOUS

## 2016-02-25 MED ORDER — NA CHONDROIT SULF-NA HYALURON 40-17 MG/ML IO SOLN
INTRAOCULAR | Status: DC | PRN
  Administered 2016-02-25: 1 mL via INTRAOCULAR

## 2016-02-25 MED ORDER — TRYPAN BLUE 0.06 % OP SOLN
OPHTHALMIC | Status: DC | PRN
  Administered 2016-02-25: .5 mL via INTRAOCULAR

## 2016-02-25 MED ORDER — PHENYLEPHRINE HCL 10 % OP SOLN
1.0000 [drp] | OPHTHALMIC | Status: AC
  Administered 2016-02-25 (×4): 1 [drp] via OPHTHALMIC

## 2016-02-25 MED ORDER — MIDAZOLAM HCL 2 MG/2ML IJ SOLN
INTRAMUSCULAR | Status: DC | PRN
  Administered 2016-02-25: 1 mg via INTRAVENOUS

## 2016-02-25 MED ORDER — BUPIVACAINE HCL (PF) 0.75 % IJ SOLN
INTRAMUSCULAR | Status: AC
  Filled 2016-02-25: qty 10

## 2016-02-25 MED ORDER — NA CHONDROIT SULF-NA HYALURON 40-17 MG/ML IO SOLN
INTRAOCULAR | Status: AC
  Filled 2016-02-25: qty 1

## 2016-02-25 MED ORDER — TETRACAINE HCL 0.5 % OP SOLN
OPHTHALMIC | Status: AC
  Filled 2016-02-25: qty 2

## 2016-02-25 MED ORDER — POVIDONE-IODINE 5 % OP SOLN
OPHTHALMIC | Status: AC
  Filled 2016-02-25: qty 30

## 2016-02-25 MED ORDER — TRYPAN BLUE 0.06 % OP SOLN
OPHTHALMIC | Status: AC
  Filled 2016-02-25: qty 0.5

## 2016-02-25 MED ORDER — TETRACAINE HCL 0.5 % OP SOLN
OPHTHALMIC | Status: DC | PRN
  Administered 2016-02-25: 1 [drp] via OPHTHALMIC

## 2016-02-25 MED ORDER — EPINEPHRINE HCL 1 MG/ML IJ SOLN
INTRAMUSCULAR | Status: AC
  Filled 2016-02-25: qty 2

## 2016-02-25 MED ORDER — CYCLOPENTOLATE HCL 2 % OP SOLN
OPHTHALMIC | Status: AC
  Administered 2016-02-25: 1 [drp] via OPHTHALMIC
  Filled 2016-02-25: qty 2

## 2016-02-25 MED ORDER — CARBACHOL 0.01 % IO SOLN
INTRAOCULAR | Status: DC | PRN
  Administered 2016-02-25: .5 mL via INTRAOCULAR

## 2016-02-25 MED ORDER — POVIDONE-IODINE 5 % OP SOLN
OPHTHALMIC | Status: DC | PRN
Start: 2016-02-25 — End: 2016-02-25
  Administered 2016-02-25: 1 via OPHTHALMIC

## 2016-02-25 SURGICAL SUPPLY — 31 items
CANNULA ANT/CHMB 27GA (MISCELLANEOUS) ×4 IMPLANT
CORD BIP STRL DISP 12FT (MISCELLANEOUS) ×2 IMPLANT
CUP MEDICINE 2OZ PLAST GRAD ST (MISCELLANEOUS) ×2 IMPLANT
DRAPE XRAY CASSETTE 23X24 (DRAPES) ×2 IMPLANT
ERASER HMR WETFIELD 18G (MISCELLANEOUS) ×2 IMPLANT
FILTER MILLEX .045 (MISCELLANEOUS) ×1 IMPLANT
FLTR MILLEX .045 (MISCELLANEOUS) ×2
GLOVE BIO SURGEON STRL SZ8 (GLOVE) ×2 IMPLANT
GLOVE SURG LX 6.5 MICRO (GLOVE) ×1
GLOVE SURG LX 8.0 MICRO (GLOVE) ×1
GLOVE SURG LX STRL 6.5 MICRO (GLOVE) ×1 IMPLANT
GLOVE SURG LX STRL 8.0 MICRO (GLOVE) ×1 IMPLANT
GOWN STRL REUS W/ TWL LRG LVL3 (GOWN DISPOSABLE) ×1 IMPLANT
GOWN STRL REUS W/ TWL XL LVL3 (GOWN DISPOSABLE) ×1 IMPLANT
GOWN STRL REUS W/TWL LRG LVL3 (GOWN DISPOSABLE) ×1
GOWN STRL REUS W/TWL XL LVL3 (GOWN DISPOSABLE) ×1
LENS IOL ACRYSOF IQ 20.0 (Intraocular Lens) ×2 IMPLANT
PACK CATARACT (MISCELLANEOUS) ×2 IMPLANT
PACK CATARACT DINGLEDEIN LX (MISCELLANEOUS) ×2 IMPLANT
PACK EYE AFTER SURG (MISCELLANEOUS) ×2 IMPLANT
SHLD EYE VISITEC  UNIV (MISCELLANEOUS) ×2 IMPLANT
SOL BSS BAG (MISCELLANEOUS) ×2
SOL PREP PVP 2OZ (MISCELLANEOUS) ×2
SOLUTION BSS BAG (MISCELLANEOUS) ×1 IMPLANT
SOLUTION PREP PVP 2OZ (MISCELLANEOUS) ×1 IMPLANT
SUT SILK 5-0 (SUTURE) ×2 IMPLANT
SYR 3ML LL SCALE MARK (SYRINGE) ×4 IMPLANT
SYR 5ML LL (SYRINGE) ×2 IMPLANT
SYR TB 1ML 27GX1/2 LL (SYRINGE) ×2 IMPLANT
WATER STERILE IRR 1000ML POUR (IV SOLUTION) ×2 IMPLANT
WIPE NON LINTING 3.25X3.25 (MISCELLANEOUS) ×2 IMPLANT

## 2016-02-25 NOTE — Op Note (Signed)
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 Item Serial No. Manufacturer Lot No. LRB No. Used  LENS IOL ACRYSOF IQ 20.0 - [id number] Intraocular Lens LENS IOL ACRYSOF IQ 20.0 [id number] ALCON   Right 1   Anesthesia: 2% Lidocaine and 4% Marcaine in a 50/50 mixture with 10 unites/ml of Hylenex given as a peribulbar Anesthesiologist: Anesthesiologist: Berdine AddisonMathai Thomas, MD CRNA: Peyton Najjaraniel Simmons, CRNA; Stormy FabianLinda Curtis, CRNA Complications: none Estimated Blood Loss: less than 1 ml  Description of procedure:  The patient was given anesthesia and sedation via intravenous access. The patient was then prepped and draped in the usual fashion. A 25-gauge needle was bent for initiating the capsulorhexis. A 5-0 silk suture was placed through the conjunctiva superior and inferiorly to serve as bridle sutures. Hemostasis was obtained at the superior limbus using an eraser cautery. A partial thickness groove was made at the anterior surgical limbus with a 64 Beaver blade and this was dissected anteriorly with an SYSCOlcon Crescent knife. The anterior chamber was entered at 10 o'clock and 2 o'clock with a 1.0 mm paracentesis knife. Epi-Shugarcaine 0.5 CC [9 cc BSS Plus (Alcon), 3 cc 4% preservative-free lidocaine (Hospira) and 4 cc 1:1000 preservative-free, bisulfite-free epinephrine] was injected into the anterior chamber via the paracentesis tract. Air was introduced via the paracentesis to replace the aqueous and Vision Blue was used to paint the surface of the anterior capsule. The air was then replaced with DiscoVisc.   The anterior chamber was entered through the lamellar dissection with a 2.6 mm Alcon keratome.A  continuous tear curvilinear capsulorhexis was performed using a bent 25-gauge needle.  Balance salt on a syringe was used to perform hydro-dissection and phacoemulsification was carried out using a divide and conquer technique. Procedure(s) with comments: CATARACT EXTRACTION PHACO AND INTRAOCULAR LENS PLACEMENT (IOC) (Right) - US    00:48.2 AP%   23.5 CDE   22.58 fluid pack lot # [id number]. Irrigation/aspiration was used to remove the residual cortex and the capsular bag was inflated with DiscoVisc. Irrigation/aspiration was used to remove the residual cortex and the capsular bag was inflated with DiscoVisc. The intraocular lens was inserted into the capsular bag using a pre-loaded UltraSert Delivery System. Irrigation/aspiration was used to remove the residual DiscoVisc. The wound was inflated with balanced salt and checked for leaks. None were found. Miostat was injected via the paracentesis track and 0.1 ml of cefuroxime containing 1 mg of drug  was injected via the paracentesis track. The wound was checked for leaks again and none were found.   The bridal sutures were removed and two drops of Vigamox were placed on the eye. An eye shield was placed to protect the eye and the patient was discharged to the recovery area in good condition.   Srishti Strnad MD

## 2016-02-25 NOTE — Anesthesia Postprocedure Evaluation (Signed)
Anesthesia Post Note  Patient: Kristine Alexander  Procedure(s) Performed: Procedure(s) (LRB): CATARACT EXTRACTION PHACO AND INTRAOCULAR LENS PLACEMENT (IOC) (Right)  Patient location during evaluation: Other Anesthesia Type: General Level of consciousness: awake and alert Pain management: pain level controlled Vital Signs Assessment: post-procedure vital signs reviewed and stable Respiratory status: spontaneous breathing, nonlabored ventilation, respiratory function stable and patient connected to nasal cannula oxygen Cardiovascular status: blood pressure returned to baseline and stable Postop Assessment: no signs of nausea or vomiting Anesthetic complications: no    Last Vitals:  Filed Vitals:   02/25/16 0904 02/25/16 0908  BP: 143/73 136/71  Pulse: 67   Temp: 36.8 C   Resp: 16 16    Last Pain: There were no vitals filed for this visit.               Satina Jerrell S

## 2016-02-25 NOTE — Transfer of Care (Signed)
Immediate Anesthesia Transfer of Care Note  Patient: Gourmej Fontes  Procedure(s) Performed: Procedure(s) with comments: CATARACT EXTRACTION PHACO AND INTRAOCULAR LENS PLACEMENT (IOC) (Right) - US    00:48.2 AP%   23.5 CDE   22.58 fluid pack lot # [id number]  Patient Location: PACU  Anesthesia Type:MAC  Level of Consciousness: awake, alert  and oriented  Airway & Oxygen Therapy: Patient Spontanous Breathing  Post-op Assessment: Report given to RN and Post -op Vital signs reviewed and stable  Post vital signs: Reviewed and stable  Last Vitals:  Filed Vitals:   02/25/16 0701 02/25/16 0904  BP: 109/69 143/73  Pulse: 70 67  Temp: 36.2 C 36.8 C  Resp: 16 16    Complications: No apparent anesthesia complications

## 2016-02-25 NOTE — Anesthesia Preprocedure Evaluation (Signed)
Anesthesia Evaluation  Patient identified by MRN, date of birth, ID band Patient awake    Reviewed: Allergy & Precautions, NPO status , Patient's Chart, lab work & pertinent test results, reviewed documented beta blocker date and time   Airway Mallampati: II  TM Distance: >3 FB     Dental  (+) Chipped   Pulmonary           Cardiovascular      Neuro/Psych    GI/Hepatic   Endo/Other    Renal/GU      Musculoskeletal  (+) Arthritis ,   Abdominal   Peds  Hematology  (+) anemia ,   Anesthesia Other Findings   Reproductive/Obstetrics                             Anesthesia Physical Anesthesia Plan  ASA: II  Anesthesia Plan: MAC   Post-op Pain Management:    Induction: Intravenous  Airway Management Planned: Nasal Cannula  Additional Equipment:   Intra-op Plan:   Post-operative Plan:   Informed Consent: I have reviewed the patients History and Physical, chart, labs and discussed the procedure including the risks, benefits and alternatives for the proposed anesthesia with the patient or authorized representative who has indicated his/her understanding and acceptance.     Plan Discussed with: CRNA  Anesthesia Plan Comments:         Anesthesia Quick Evaluation

## 2016-02-25 NOTE — Discharge Instructions (Signed)
AMBULATORY SURGERY  DISCHARGE INSTRUCTIONS   1) The drugs that you were given will stay in your system until tomorrow so for the next 24 hours you should not:  A) Drive an automobile B) Make any legal decisions C) Drink any alcoholic beverage   2) You may resume regular meals tomorrow.  Today it is better to start with liquids and gradually work up to solid foods.  You may eat anything you prefer, but it is better to start with liquids, then soup and crackers, and gradually work up to solid foods.   3) Please notify your doctor immediately if you have any unusual bleeding, trouble breathing, redness and pain at the surgery site, drainage, fever, or pain not relieved by medication.    4) Additional Instructions:   Eye Surgery Discharge Instructions  Expect mild scratchy sensation or mild soreness. DO NOT RUB YOUR EYE!  The day of surgery:  Minimal physical activity, but bed rest is not required  No reading, computer work, or close hand work  No bending, lifting, or straining.  May watch TV  For 24 hours:  No driving, legal decisions, or alcoholic beverages  Safety precautions  Eat anything you prefer: It is better to start with liquids, then soup then solid foods.  _____ Eye patch should be worn until postoperative exam tomorrow.  ____ Solar shield eyeglasses should be worn for comfort in the sunlight/patch while sleeping  Resume all regular medications including aspirin or Coumadin if these were discontinued prior to surgery. You may shower, bathe, shave, or wash your hair. Tylenol may be taken for mild discomfort.  Call your doctor if you experience significant pain, nausea, or vomiting, fever > 101 or other signs of infection. [phone] or [phone] Specific instructions:  Follow-up Information    Follow up with Sallee LangeINGELDEIN,Kemaria Dedic, MD.   Specialty:  Ophthalmology   Why:  April 11 at 9:45am   Contact information:   [address]   [place]  [place] [id number] [phone]          Please contact your physician with any problems or Same Day Surgery at [phone], Monday through Friday 6 am to 4 pm, or [place] at [facility] Main number at [phone].

## 2016-02-25 NOTE — Interval H&P Note (Signed)
History and Physical Interval Note:  02/25/2016 7:28 AM  Kristine Alexander  has presented today for surgery, with the diagnosis of CATARACT  The various methods of treatment have been discussed with the patient and family. After consideration of risks, benefits and other options for treatment, the patient has consented to  Procedure(s): CATARACT EXTRACTION PHACO AND INTRAOCULAR LENS PLACEMENT (IOC) (Right) as a surgical intervention .  The patient's history has been reviewed, patient examined, no change in status, stable for surgery.  I have reviewed the patient's chart and labs.  Questions were answered to the patient's satisfaction.     Alixandra Alfieri

## 2017-01-30 ENCOUNTER — Ambulatory Visit (INDEPENDENT_AMBULATORY_CARE_PROVIDER_SITE_OTHER): Payer: BLUE CROSS/BLUE SHIELD | Admitting: Family Medicine

## 2017-01-30 ENCOUNTER — Encounter: Payer: Self-pay | Admitting: Family Medicine

## 2017-01-30 VITALS — BP 138/84 | HR 92 | Temp 98.1°F | Resp 14 | Wt 169.5 lb

## 2017-01-30 DIAGNOSIS — R1013 Epigastric pain: Secondary | ICD-10-CM

## 2017-01-30 DIAGNOSIS — Z23 Encounter for immunization: Secondary | ICD-10-CM

## 2017-01-30 DIAGNOSIS — E785 Hyperlipidemia, unspecified: Secondary | ICD-10-CM | POA: Diagnosis not present

## 2017-01-30 DIAGNOSIS — D509 Iron deficiency anemia, unspecified: Secondary | ICD-10-CM | POA: Diagnosis not present

## 2017-01-30 DIAGNOSIS — Z1231 Encounter for screening mammogram for malignant neoplasm of breast: Secondary | ICD-10-CM | POA: Diagnosis not present

## 2017-01-30 DIAGNOSIS — Z1159 Encounter for screening for other viral diseases: Secondary | ICD-10-CM

## 2017-01-30 DIAGNOSIS — Z114 Encounter for screening for human immunodeficiency virus [HIV]: Secondary | ICD-10-CM | POA: Diagnosis not present

## 2017-01-30 DIAGNOSIS — J0100 Acute maxillary sinusitis, unspecified: Secondary | ICD-10-CM | POA: Diagnosis not present

## 2017-01-30 DIAGNOSIS — Z1239 Encounter for other screening for malignant neoplasm of breast: Secondary | ICD-10-CM

## 2017-01-30 DIAGNOSIS — R109 Unspecified abdominal pain: Secondary | ICD-10-CM | POA: Insufficient documentation

## 2017-01-30 LAB — CBC WITH DIFFERENTIAL/PLATELET
BASOS ABS: 0 {cells}/uL (ref 0–200)
BASOS PCT: 0 %
EOS ABS: 237 {cells}/uL (ref 15–500)
Eosinophils Relative: 3 %
HEMATOCRIT: 35.7 % (ref 35.0–45.0)
Hemoglobin: 12 g/dL (ref 11.7–15.5)
LYMPHS PCT: 36 %
Lymphs Abs: 2844 cells/uL (ref 850–3900)
MCH: 26.4 pg — ABNORMAL LOW (ref 27.0–33.0)
MCHC: 33.6 g/dL (ref 32.0–36.0)
MCV: 78.5 fL — AB (ref 80.0–100.0)
MONO ABS: 711 {cells}/uL (ref 200–950)
MPV: 9 fL (ref 7.5–12.5)
Monocytes Relative: 9 %
NEUTROS PCT: 52 %
Neutro Abs: 4108 cells/uL (ref 1500–7800)
Platelets: 310 10*3/uL (ref 140–400)
RBC: 4.55 MIL/uL (ref 3.80–5.10)
RDW: 15 % (ref 11.0–15.0)
WBC: 7.9 10*3/uL (ref 3.8–10.8)

## 2017-01-30 MED ORDER — AMOXICILLIN-POT CLAVULANATE 875-125 MG PO TABS: 1.0000 | ORAL_TABLET | Freq: Two times a day (BID) | ORAL | 0 refills | Status: AC

## 2017-01-30 NOTE — Patient Instructions (Signed)
Please start the antibiotics Please do eat yogurt daily or take a probiotic daily for the next month or two We want to replace the healthy germs in the gut If you notice foul, watery diarrhea in the next two months, schedule an appointment RIGHT AWAY We'll get labs today Please return the stool cards as soon as you finish with them If your pain becomes severe, go to the emergency department

## 2017-01-30 NOTE — Assessment & Plan Note (Addendum)
Check labs; patient refuses colonoscopy

## 2017-01-30 NOTE — Assessment & Plan Note (Signed)
Check lipids 

## 2017-01-30 NOTE — Assessment & Plan Note (Signed)
Patient refuses CT scan; she will get the labs; stool cards given with instructions

## 2017-01-30 NOTE — Progress Notes (Signed)
BP 138/84   Pulse 92   Temp 98.1 F (36.7 C) (Oral)   Resp 14   Wt 169 lb 8 oz (76.9 kg)   SpO2 96%   BMI 33.10 kg/m    Subjective:    Patient ID: Kristine Alexander, female    DOB: [birth year], 61 y.o.   MRN: [id number]  HPI: Kristine Alexander is a 61 y.o. female  Chief Complaint  Patient presents with  . Dizziness    with headache  . Nausea    reflex   Patient is here with her daughter; no translator is present; patient does not speak English A few weeks ago; she was having swelling in her stomach Whenever she eats, she feels light-headed and throwing up Feels something sharp No fever Only pain Vomiting; last episode last week No change n pain with eating Good appetite Sometimes she has dizziness, headache; almost a year; talked with Dr. Sherley Bounds last time Sinus drainage Sees cardiologist across the street and everything was okay with the heart  She says she is anemic; doesn't know why; always has been Eats a lot of junk food Never had a colonoscopy; patient does not want a colonoscopy Documented that patient politely but adamantly declined colonoscopy after I asked her daughter to relay word for work to her mother what I said and relay back word for word what her mother said  Depression screen [facility] 2/9 02/17/2017 01/30/2017 02/01/2016 12/26/2015 09/18/2015  Decreased Interest 0 0 0 0 0  Down, Depressed, Hopeless 0 0 0 0 0  PHQ - 2 Score 0 0 0 0 0   Relevant past medical, surgical, family and social history reviewed Past Medical History:  Diagnosis Date  . Hyperlipidemia   . Iron deficiency anemia   . Osteoarthritis of multiple joints    Past Surgical History:  Procedure Laterality Date  . CATARACT EXTRACTION W/PHACO Right 02/25/2016   Procedure: CATARACT EXTRACTION PHACO AND INTRAOCULAR LENS PLACEMENT (IOC);  Surgeon: Sallee Lange, MD;  Location: [facility] ORS;  Service: Ophthalmology;  Laterality: Right;  [place]    00:48.2 AP%   23.5 CDE   22.58 fluid pack lot #  [id number]  . TUBAL LIGATION     Family History  Problem Relation Age of Onset  . Hyperlipidemia Mother   . Hyperlipidemia Father   . Hyperlipidemia Sister   . Diabetes Brother   . Hyperlipidemia Brother    Social History  Substance Use Topics  . Smoking status: Never Smoker  . Smokeless tobacco: Never Used  . Alcohol use No   Interim medical history since last visit reviewed. Allergies and medications reviewed  Review of Systems Per HPI unless specifically indicated above     Objective:    BP 138/84   Pulse 92   Temp 98.1 F (36.7 C) (Oral)   Resp 14   Wt 169 lb 8 oz (76.9 kg)   SpO2 96%   BMI 33.10 kg/m   Wt Readings from Last 3 Encounters:  02/17/17 170 lb 1.6 oz (77.2 kg)  01/30/17 169 lb 8 oz (76.9 kg)  02/25/16 168 lb (76.2 kg)    Physical Exam  Constitutional: She appears well-developed and well-nourished. No distress.  HENT:  Nose: Mucosal edema and rhinorrhea present. Right sinus exhibits maxillary sinus tenderness. Left sinus exhibits maxillary sinus tenderness.  Mouth/Throat: Oropharynx is clear and moist and mucous membranes are normal.  Eyes: EOM are normal. No scleral icterus.  Neck: No thyromegaly present.  Cardiovascular: Normal rate and regular  rhythm.   Pulmonary/Chest: Effort normal and breath sounds normal.  Abdominal: Soft. Bowel sounds are normal. She exhibits no distension and no mass. There is no tenderness. There is no guarding.  Skin: No pallor.  Psychiatric: She has a normal mood and affect. Her behavior is normal. Judgment and thought content normal.       Assessment & Plan:   Problem List Items Addressed This Visit      Respiratory   Sinusitis, acute maxillary    Start antibiotics        Other   Iron deficiency anemia    Check labs; patient refuses colonoscopy      Relevant Orders   CBC with Differential/Platelet (Completed)   Iron, TIBC and Ferritin Panel (Completed)   Hyperlipidemia LDL goal <100    Check lipids        Relevant Orders   Lipid panel (Completed)   Abdominal pain    Patient refuses CT scan; she will get the labs; stool cards given with instructions      Relevant Orders   COMPLETE METABOLIC PANEL WITH GFR (Completed)   Lipase (Completed)   CBC with Differential/Platelet (Completed)   Urinalysis w microscopic + reflex cultur (Completed)    Other Visit Diagnoses    Breast cancer screening    -  Primary   Relevant Orders   MM Digital Screening   Encounter for hepatitis C screening test for low risk patient       Relevant Orders   Hepatitis C Antibody (Completed)   Screening for HIV (human immunodeficiency virus)       Relevant Orders   HIV antibody (with reflex) (Completed)   Need for vaccination for H flu type B       Relevant Orders   Flu Vaccine QUAD 36+ mos PF IM (Fluarix & Fluzone Quad PF) (Completed)       Follow up plan: Return in about 2 weeks (around 02/13/2017) for follow-up with interpreter, 40 minutes.  An after-visit summary was printed and given to the patient at check-out.  Please see the patient instructions which may contain other information and recommendations beyond what is mentioned above in the assessment and plan.  Meds ordered this encounter  Medications  . amoxicillin-clavulanate (AUGMENTIN) 875-125 MG tablet    Sig: Take 1 tablet by mouth 2 (two) times daily.    Dispense:  20 tablet    Refill:  0    Orders Placed This Encounter  Procedures  . MM Digital Screening  . Flu Vaccine QUAD 36+ mos PF IM (Fluarix & Fluzone Quad PF)  . HIV antibody (with reflex)  . Hepatitis C Antibody  . COMPLETE METABOLIC PANEL WITH GFR  . Lipase  . CBC with Differential/Platelet  . Iron, TIBC and Ferritin Panel  . Lipid panel  . Urinalysis w microscopic + reflex cultur

## 2017-01-30 NOTE — Assessment & Plan Note (Signed)
Start antibiotics

## 2017-01-31 LAB — URINALYSIS W MICROSCOPIC + REFLEX CULTURE
BACTERIA UA: NONE SEEN [HPF]
Bilirubin Urine: NEGATIVE
CASTS: NONE SEEN [LPF]
CRYSTALS: NONE SEEN [HPF]
Glucose, UA: NEGATIVE
HGB URINE DIPSTICK: NEGATIVE
KETONES UR: NEGATIVE
Leukocytes, UA: NEGATIVE
NITRITE: NEGATIVE
PH: 7 (ref 5.0–8.0)
Protein, ur: NEGATIVE
RBC / HPF: NONE SEEN RBC/HPF (ref <=2)
SQUAMOUS EPITHELIAL / LPF: NONE SEEN [HPF] (ref <=5)
Specific Gravity, Urine: 1.011 (ref 1.001–1.035)
WBC, UA: NONE SEEN WBC/HPF (ref <=5)
Yeast: NONE SEEN [HPF]

## 2017-01-31 LAB — COMPLETE METABOLIC PANEL WITH GFR
ALT: 18 U/L (ref 6–29)
AST: 22 U/L (ref 10–35)
Albumin: 4.3 g/dL (ref 3.6–5.1)
Alkaline Phosphatase: 57 U/L (ref 33–130)
BILIRUBIN TOTAL: 0.3 mg/dL (ref 0.2–1.2)
BUN: 13 mg/dL (ref 7–25)
CHLORIDE: 106 mmol/L (ref 98–110)
CO2: 25 mmol/L (ref 20–31)
CREATININE: 0.59 mg/dL (ref 0.50–0.99)
Calcium: 9.5 mg/dL (ref 8.6–10.4)
GFR, Est African American: >89 mL/min (ref >=60)
GFR, Est Non African American: >89 mL/min (ref >=60)
GLUCOSE: 96 mg/dL (ref 65–99)
POTASSIUM: 4.2 mmol/L (ref 3.5–5.3)
SODIUM: 139 mmol/L (ref 135–146)
TOTAL PROTEIN: 8.2 g/dL — AB (ref 6.1–8.1)

## 2017-01-31 LAB — IRON,TIBC AND FERRITIN PANEL
%SAT: 13 % (ref 11–50)
Ferritin: 95 ng/mL (ref 20–288)
Iron: 58 ug/dL (ref 45–160)
TIBC: 436 ug/dL (ref 250–450)

## 2017-01-31 LAB — LIPID PANEL
Cholesterol: 133 mg/dL (ref <200)
HDL: 43 mg/dL — AB (ref >50)
LDL CALC: 80 mg/dL (ref <100)
TRIGLYCERIDES: 48 mg/dL (ref <150)
Total CHOL/HDL Ratio: 3.1 Ratio (ref <5.0)
VLDL: 10 mg/dL (ref <30)

## 2017-01-31 LAB — HEPATITIS C ANTIBODY: HCV Ab: NEGATIVE

## 2017-01-31 LAB — LIPASE: Lipase: 37 U/L (ref 7–60)

## 2017-02-01 LAB — HIV ANTIBODY (ROUTINE TESTING W REFLEX): HIV: NONREACTIVE

## 2017-02-17 ENCOUNTER — Ambulatory Visit (INDEPENDENT_AMBULATORY_CARE_PROVIDER_SITE_OTHER): Payer: BLUE CROSS/BLUE SHIELD | Admitting: Family Medicine

## 2017-02-17 ENCOUNTER — Encounter: Payer: Self-pay | Admitting: Family Medicine

## 2017-02-17 DIAGNOSIS — E786 Lipoprotein deficiency: Secondary | ICD-10-CM

## 2017-02-17 DIAGNOSIS — R718 Other abnormality of red blood cells: Secondary | ICD-10-CM | POA: Diagnosis not present

## 2017-02-17 NOTE — Progress Notes (Signed)
BP 126/62   Pulse 82   Temp 98.1 F (36.7 C) (Oral)   Resp 14   Wt 170 lb 1.6 oz (77.2 kg)   SpO2 96%   BMI 33.22 kg/m    Subjective:    Patient ID: Kristine Alexander, female    DOB: 1956-02-14, 61 y.o.   MRN: [id number]  HPI: Kristine Alexander is a 61 y.o. female  Chief Complaint  Patient presents with  . Follow-up    2 weeks   Patient is here with her daughter; language line interpreter is used [place])  She is feeling good today Patient says today it is okay Much better today She is not having any dizziness No problems today She has been taking the medicine and is feeling okay  First test was urinalysis, completely normal LDL has improved from 118 to 80 She is watching her food now Her HDL is not high enough at 43 Discussed exercise for raising HDL I will be walking more from now on she says Gradually and up to 150, 30 minutes 5 days a week  Small pale red blood cells She has been told she was anemic most of her life She does not need extra iron Hep C negative  Any questions about bloodwork, reviewed all with interpreter through language line  Depression screen [facility] 2/9 02/17/2017 01/30/2017 02/01/2016 12/26/2015 09/18/2015  Decreased Interest 0 0 0 0 0  Down, Depressed, Hopeless 0 0 0 0 0  PHQ - 2 Score 0 0 0 0 0    Relevant past medical, surgical, family and social history reviewed Past Medical History:  Diagnosis Date  . Hyperlipidemia   . Iron deficiency anemia   . Osteoarthritis of multiple joints    Past Surgical History:  Procedure Laterality Date  . CATARACT EXTRACTION W/PHACO Right 02/25/2016   Procedure: CATARACT EXTRACTION PHACO AND INTRAOCULAR LENS PLACEMENT (IOC);  Surgeon: Estill Cotta, MD;  Location: [facility] ORS;  Service: Ophthalmology;  Laterality: Right;  [place]    00:48.2 AP%   23.5 CDE   22.58 fluid pack lot # [id number]  . TUBAL LIGATION     Family History  Problem Relation Age of Onset  . Hyperlipidemia Mother   . Hyperlipidemia Father    . Hyperlipidemia Sister   . Diabetes Brother   . Hyperlipidemia Brother    Social History  Substance Use Topics  . Smoking status: Never Smoker  . Smokeless tobacco: Never Used  . Alcohol use No    Interim medical history since last visit reviewed. Allergies and medications reviewed  Review of Systems Per HPI unless specifically indicated above     Objective:    BP 126/62   Pulse 82   Temp 98.1 F (36.7 C) (Oral)   Resp 14   Wt 170 lb 1.6 oz (77.2 kg)   SpO2 96%   BMI 33.22 kg/m   Wt Readings from Last 3 Encounters:  02/17/17 170 lb 1.6 oz (77.2 kg)  01/30/17 169 lb 8 oz (76.9 kg)  02/25/16 168 lb (76.2 kg)    Physical Exam  Constitutional: She appears well-developed and well-nourished.  HENT:  Mouth/Throat: Mucous membranes are normal.  Eyes: EOM are normal. No scleral icterus.  Cardiovascular: Normal rate and regular rhythm.   Pulmonary/Chest: Effort normal and breath sounds normal.  Psychiatric: She has a normal mood and affect. Her behavior is normal.    Results for orders placed or performed in visit on 01/30/17  HIV antibody (with reflex)  Result Value  Ref Range   HIV 1&2 Ab, 4th Generation NONREACTIVE NONREACTIVE  Hepatitis C Antibody  Result Value Ref Range   HCV Ab NEGATIVE NEGATIVE  COMPLETE METABOLIC PANEL WITH GFR  Result Value Ref Range   Sodium 139 135 - 146 mmol/L   Potassium 4.2 3.5 - 5.3 mmol/L   Chloride 106 98 - 110 mmol/L   CO2 25 20 - 31 mmol/L   Glucose, Bld 96 65 - 99 mg/dL   BUN 13 7 - 25 mg/dL   Creat 0.59 0.50 - 0.99 mg/dL   Total Bilirubin 0.3 0.2 - 1.2 mg/dL   Alkaline Phosphatase 57 33 - 130 U/L   AST 22 10 - 35 U/L   ALT 18 6 - 29 U/L   Total Protein 8.2 (H) 6.1 - 8.1 g/dL   Albumin 4.3 3.6 - 5.1 g/dL   Calcium 9.5 8.6 - 10.4 mg/dL   GFR, Est African American >89 >=60 mL/min   GFR, Est Non African American >89 >=60 mL/min  Lipase  Result Value Ref Range   Lipase 37 7 - 60 U/L  CBC with Differential/Platelet    Result Value Ref Range   WBC 7.9 3.8 - 10.8 K/uL   RBC 4.55 3.80 - 5.10 MIL/uL   Hemoglobin 12.0 11.7 - 15.5 g/dL   HCT 35.7 35.0 - 45.0 %   MCV 78.5 (L) 80.0 - 100.0 fL   MCH 26.4 (L) 27.0 - 33.0 pg   MCHC 33.6 32.0 - 36.0 g/dL   RDW 15.0 11.0 - 15.0 %   Platelets 310 140 - 400 K/uL   MPV 9.0 7.5 - 12.5 fL   Neutro Abs 4,108 1,500 - 7,800 cells/uL   Lymphs Abs 2,844 850 - 3,900 cells/uL   Monocytes Absolute 711 200 - 950 cells/uL   Eosinophils Absolute 237 15 - 500 cells/uL   Basophils Absolute 0 0 - 200 cells/uL   Neutrophils Relative % 52 %   Lymphocytes Relative 36 %   Monocytes Relative 9 %   Eosinophils Relative 3 %   Basophils Relative 0 %   Smear Review Criteria for review not met   Iron, TIBC and Ferritin Panel  Result Value Ref Range   Ferritin 95 20 - 288 ng/mL   Iron 58 45 - 160 ug/dL   TIBC 436 250 - 450 ug/dL   %SAT 13 11 - 50 %  Lipid panel  Result Value Ref Range   Cholesterol 133 <200 mg/dL   Triglycerides 48 <150 mg/dL   HDL 43 (L) >50 mg/dL   Total CHOL/HDL Ratio 3.1 <5.0 Ratio   VLDL 10 <30 mg/dL   LDL Cholesterol 80 <100 mg/dL  Urinalysis w microscopic + reflex cultur  Result Value Ref Range   Color, Urine YELLOW YELLOW   APPearance CLEAR CLEAR   Specific Gravity, Urine 1.011 1.001 - 1.035   pH 7.0 5.0 - 8.0   Glucose, UA NEGATIVE NEGATIVE   Bilirubin Urine NEGATIVE NEGATIVE   Ketones, ur NEGATIVE NEGATIVE   Hgb urine dipstick NEGATIVE NEGATIVE   Protein, ur NEGATIVE NEGATIVE   Nitrite NEGATIVE NEGATIVE   Leukocytes, UA NEGATIVE NEGATIVE   WBC, UA NONE SEEN <=5 WBC/HPF   RBC / HPF NONE SEEN <=2 RBC/HPF   Squamous Epithelial / LPF NONE SEEN <=5 HPF   Bacteria, UA NONE SEEN NONE SEEN HPF   Crystals NONE SEEN NONE SEEN HPF   Casts NONE SEEN NONE SEEN LPF   Yeast NONE SEEN NONE SEEN HPF  Assessment & Plan:   Problem List Items Addressed This Visit      Other   Microcytosis    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  An after-visit summary was printed and given to the patient at [place].  Please see the patient instructions which may contain other information and recommendations beyond what is mentioned above in the assessment and plan.  Extra time was needed because of interpretation services Face-to-face time with patient was more than 25 minutes, >50% time spent counseling and coordination of care

## 2017-02-17 NOTE — Patient Instructions (Addendum)
Try to walk 30 minutes a day 5 days a week Please call before your next appointment if you are having any problems Try to lose 10 pounds over the next few months

## 2017-03-01 DIAGNOSIS — E786 Lipoprotein deficiency: Secondary | ICD-10-CM | POA: Insufficient documentation

## 2017-03-01 DIAGNOSIS — R718 Other abnormality of red blood cells: Secondary | ICD-10-CM | POA: Insufficient documentation

## 2017-03-01 HISTORY — DX: Other abnormality of red blood cells: R71.8

## 2017-03-01 NOTE — Assessment & Plan Note (Signed)
Encouragement given for patient to lose weight, walk more, increase slowly as tolerated

## 2017-03-01 NOTE — Assessment & Plan Note (Addendum)
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

## 2017-08-18 ENCOUNTER — Encounter: Payer: Self-pay | Admitting: Family Medicine

## 2017-08-18 ENCOUNTER — Ambulatory Visit (INDEPENDENT_AMBULATORY_CARE_PROVIDER_SITE_OTHER): Payer: BLUE CROSS/BLUE SHIELD | Admitting: Family Medicine

## 2017-08-18 VITALS — BP 118/64 | HR 87 | Temp 98.1°F | Resp 14 | Ht 60.0 in | Wt 171.8 lb

## 2017-08-18 DIAGNOSIS — R718 Other abnormality of red blood cells: Secondary | ICD-10-CM

## 2017-08-18 DIAGNOSIS — L918 Other hypertrophic disorders of the skin: Secondary | ICD-10-CM

## 2017-08-18 DIAGNOSIS — Z23 Encounter for immunization: Secondary | ICD-10-CM

## 2017-08-18 DIAGNOSIS — E669 Obesity, unspecified: Secondary | ICD-10-CM

## 2017-08-18 DIAGNOSIS — Z1231 Encounter for screening mammogram for malignant neoplasm of breast: Secondary | ICD-10-CM

## 2017-08-18 DIAGNOSIS — E66811 Obesity, class 1: Secondary | ICD-10-CM

## 2017-08-18 DIAGNOSIS — R200 Anesthesia of skin: Secondary | ICD-10-CM

## 2017-08-18 DIAGNOSIS — Z1239 Encounter for other screening for malignant neoplasm of breast: Secondary | ICD-10-CM

## 2017-08-18 DIAGNOSIS — E785 Hyperlipidemia, unspecified: Secondary | ICD-10-CM | POA: Diagnosis not present

## 2017-08-18 DIAGNOSIS — E786 Lipoprotein deficiency: Secondary | ICD-10-CM

## 2017-08-18 HISTORY — DX: Anesthesia of skin: R20.0

## 2017-08-18 NOTE — Assessment & Plan Note (Signed)
We'll check labs today to r/o thyroid disease and diabetes; offered referral to neurologist if labs are normal; patient agrees;

## 2017-08-18 NOTE — Assessment & Plan Note (Signed)
Check labs 

## 2017-08-18 NOTE — Progress Notes (Signed)
BP 118/64 (BP Location: Left Arm, Patient Position: Sitting, Cuff Size: Normal)   Pulse 87   Temp 98.1 F (36.7 C) (Oral)   Resp 14   Ht 5' (1.524 m)   Wt 171 lb 12.8 oz (77.9 kg)   SpO2 97%   BMI 33.55 kg/m    Subjective:    Patient ID: Kristine Alexander, female    DOB: 11/06/1956, 61 y.o.   MRN: [id number]  HPI: Kristine Alexander is a 61 y.o. female  Chief Complaint  Patient presents with  . Follow-up    6 month   . Numbness    both hands and feet for 3- 4 months   . skin moles    on the right side of the neck and left side of the stomach     HPI Patient is here with family; language line used for interpretation She has numbness in her hands and feet Going on for about 3-4 months Every day Tries yoga style on the floor, does not really help Numbness; okay with referral to neurologist; afternoon 3-4  No known family hx of thyroid disease or diabetes High cholesterol; changed her diet; vegetarian Anemia; microcytosis; chronic problem Obesity; does try to reduce weight; goes for walks for 30 minutes to an hour; I asked about water intake; not too much, 2-3 glasses per day Last pap smear was perhaps 10 years ago; she is willing to return for that She has some moles that have popped up on her neck  Depression screen [facility] 2/9 08/18/2017 02/17/2017 01/30/2017 02/01/2016 12/26/2015  Decreased Interest 0 0 0 0 0  Down, Depressed, Hopeless 0 0 0 0 0  PHQ - 2 Score 0 0 0 0 0   Relevant past medical, surgical, family and social history reviewed Past Medical History:  Diagnosis Date  . Hyperlipidemia   . Iron deficiency anemia   . Osteoarthritis of multiple joints    Past Surgical History:  Procedure Laterality Date  . CATARACT EXTRACTION W/PHACO Right 02/25/2016   Procedure: CATARACT EXTRACTION PHACO AND INTRAOCULAR LENS PLACEMENT (IOC);  Surgeon: Estill Cotta, MD;  Location: [facility] ORS;  Service: Ophthalmology;  Laterality: Right;  [place]    00:48.2 AP%   23.5 CDE   22.58 fluid  pack lot # [id number] H  . TUBAL LIGATION     Family History  Problem Relation Age of Onset  . Hyperlipidemia Mother   . Hyperlipidemia Father   . Hyperlipidemia Sister   . Diabetes Brother   . Hyperlipidemia Brother    Social History   Social History  . Marital status: Married    Spouse name: N/A  . Number of children: N/A  . Years of education: N/A   Occupational History  . Not on file.   Social History Main Topics  . Smoking status: Never Smoker  . Smokeless tobacco: Never Used  . Alcohol use No  . Drug use: No  . Sexual activity: No   Other Topics Concern  . Not on file   Social History Narrative  . No narrative on file   Interim medical history since last visit reviewed. Allergies and medications reviewed  Review of Systems Per HPI unless specifically indicated above     Objective:    BP 118/64 (BP Location: Left Arm, Patient Position: Sitting, Cuff Size: Normal)   Pulse 87   Temp 98.1 F (36.7 C) (Oral)   Resp 14   Ht 5' (1.524 m)   Wt 171 lb 12.8 oz (77.9 kg)  SpO2 97%   BMI 33.55 kg/m   Wt Readings from Last 3 Encounters:  08/18/17 171 lb 12.8 oz (77.9 kg)  02/17/17 170 lb 1.6 oz (77.2 kg)  01/30/17 169 lb 8 oz (76.9 kg)    Physical Exam  Constitutional: She appears well-developed and well-nourished.  HENT:  Mouth/Throat: Mucous membranes are normal.  Eyes: EOM are normal. No scleral icterus.  Cardiovascular: Normal rate.   Pulmonary/Chest: Effort normal. No respiratory distress.  Abdominal: She exhibits no distension.  Musculoskeletal: She exhibits no edema.  Neurological: She is alert.  Skin:  Scattered skin tags about the neck and left upper abdomen  Psychiatric: She has a normal mood and affect. Her behavior is normal.   Diabetic Foot Form - Detailed   Diabetic Foot Exam - detailed Diabetic Foot exam was performed with the following findings:  Yes 08/18/2017  4:31 PM  Visual Foot Exam completed.:  Yes  Pulse Foot Exam completed.:   Yes  Right Dorsalis Pedis:  Present Left Dorsalis Pedis:  Present  Sensory Foot Exam Completed.:  Yes Semmes-Weinstein Monofilament Test R Site 1-Great Toe:  Pos L Site 1-Great Toe:  Pos    Comments:  Sensation to monofilament testing intact all sites; however, not as sensitive distally in stocking-glove distribution both legs, up to about the knee    Results for orders placed or performed in visit on 01/30/17  HIV antibody (with reflex)  Result Value Ref Range   HIV 1&2 Ab, 4th Generation NONREACTIVE NONREACTIVE  Hepatitis C Antibody  Result Value Ref Range   HCV Ab NEGATIVE NEGATIVE  COMPLETE METABOLIC PANEL WITH GFR  Result Value Ref Range   Sodium 139 135 - 146 mmol/L   Potassium 4.2 3.5 - 5.3 mmol/L   Chloride 106 98 - 110 mmol/L   CO2 25 20 - 31 mmol/L   Glucose, Bld 96 65 - 99 mg/dL   BUN 13 7 - 25 mg/dL   Creat 0.59 0.50 - 0.99 mg/dL   Total Bilirubin 0.3 0.2 - 1.2 mg/dL   Alkaline Phosphatase 57 33 - 130 U/L   AST 22 10 - 35 U/L   ALT 18 6 - 29 U/L   Total Protein 8.2 (H) 6.1 - 8.1 g/dL   Albumin 4.3 3.6 - 5.1 g/dL   Calcium 9.5 8.6 - 10.4 mg/dL   GFR, Est African American >89 >=60 mL/min   GFR, Est Non African American >89 >=60 mL/min  Lipase  Result Value Ref Range   Lipase 37 7 - 60 U/L  CBC with Differential/Platelet  Result Value Ref Range   WBC 7.9 3.8 - 10.8 K/uL   RBC 4.55 3.80 - 5.10 MIL/uL   Hemoglobin 12.0 11.7 - 15.5 g/dL   HCT 35.7 35.0 - 45.0 %   MCV 78.5 (L) 80.0 - 100.0 fL   MCH 26.4 (L) 27.0 - 33.0 pg   MCHC 33.6 32.0 - 36.0 g/dL   RDW 15.0 11.0 - 15.0 %   Platelets 310 140 - 400 K/uL   MPV 9.0 7.5 - 12.5 fL   Neutro Abs 4,108 1,500 - 7,800 cells/uL   Lymphs Abs 2,844 850 - 3,900 cells/uL   Monocytes Absolute 711 200 - 950 cells/uL   Eosinophils Absolute 237 15 - 500 cells/uL   Basophils Absolute 0 0 - 200 cells/uL   Neutrophils Relative % 52 %   Lymphocytes Relative 36 %   Monocytes Relative 9 %   Eosinophils Relative 3 %    Basophils  Relative 0 %   Smear Review Criteria for review not met   Iron, TIBC and Ferritin Panel  Result Value Ref Range   Ferritin 95 20 - 288 ng/mL   Iron 58 45 - 160 ug/dL   TIBC 436 250 - 450 ug/dL   %SAT 13 11 - 50 %  Lipid panel  Result Value Ref Range   Cholesterol 133 <200 mg/dL   Triglycerides 48 <150 mg/dL   HDL 43 (L) >50 mg/dL   Total CHOL/HDL Ratio 3.1 <5.0 Ratio   VLDL 10 <30 mg/dL   LDL Cholesterol 80 <100 mg/dL  Urinalysis w microscopic + reflex cultur  Result Value Ref Range   Color, Urine YELLOW YELLOW   APPearance CLEAR CLEAR   Specific Gravity, Urine 1.011 1.001 - 1.035   pH 7.0 5.0 - 8.0   Glucose, UA NEGATIVE NEGATIVE   Bilirubin Urine NEGATIVE NEGATIVE   Ketones, ur NEGATIVE NEGATIVE   Hgb urine dipstick NEGATIVE NEGATIVE   Protein, ur NEGATIVE NEGATIVE   Nitrite NEGATIVE NEGATIVE   Leukocytes, UA NEGATIVE NEGATIVE   WBC, UA NONE SEEN <=5 WBC/HPF   RBC / HPF NONE SEEN <=2 RBC/HPF   Squamous Epithelial / LPF NONE SEEN <=5 HPF   Bacteria, UA NONE SEEN NONE SEEN HPF   Crystals NONE SEEN NONE SEEN HPF   Casts NONE SEEN NONE SEEN LPF   Yeast NONE SEEN NONE SEEN HPF      Assessment & Plan:   Problem List Items Addressed This Visit      Other   Hyperlipidemia LDL goal <100   Relevant Orders   Lipid panel   Low HDL (under 40)   Relevant Orders   Lipid panel   Obesity, Class I, BMI 30-34.9    Encouraged modest weight loss; discussed risks of extra weight, including breast cancer; she'll work on modest weight loss; drink more water      Relevant Orders   COMPLETE METABOLIC PANEL WITH GFR   Lipid panel   Numbness - Primary    We'll check labs today to r/o thyroid disease and diabetes; offered referral to neurologist if labs are normal; patient agrees;       Relevant Orders   TSH   Hemoglobin A1c   B12   Microcytosis    Check labs      Relevant Orders   CBC with Differential/Platelet    Other Visit Diagnoses    Needs flu shot        Relevant Orders   Flu Vaccine QUAD 6+ mos PF IM (Fluarix Quad PF) (Completed)   Screening for breast cancer       Skin tags, multiple acquired       explained may be associated with weight, diabetes; encouraged weight loss       Follow up plan: Return in about 6 months (around 02/16/2018) for twenty minute follow-up with fasting labs; CPE in the next few months.  An after-visit summary was printed and given to the patient at [place].  Please see the patient instructions which may contain other information and recommendations beyond what is mentioned above in the assessment and plan.  Meds ordered this encounter  Medications  . naproxen sodium (ANAPROX) 220 MG tablet    Sig: Take 220 mg by mouth 2 (two) times daily as needed.    Orders Placed This Encounter  Procedures  . Flu Vaccine QUAD 6+ mos PF IM (Fluarix Quad PF)  . COMPLETE METABOLIC PANEL WITH GFR  .  CBC with Differential/Platelet  . Lipid panel  . TSH  . Hemoglobin A1c  . B12  Face-to-face time with patient was more than 40 minutes, >50% time spent counseling and coordination of care

## 2017-08-18 NOTE — Assessment & Plan Note (Addendum)
Encouraged modest weight loss; discussed risks of extra weight, including breast cancer; she'll work on modest weight loss; drink more water

## 2017-08-18 NOTE — Patient Instructions (Addendum)
We'll check labs today If you have not heard anything from my staff in a week about any orders/referrals/studies from today, please contact us here to follow-up [phone] Check out the information at [email or web address].[email or web address] entitled "Nutrition for Weight Loss: What You Need to Know about Fad Diets" Try to lose between 1-2 pounds per week by taking in fewer calories and burning off more calories You can succeed by limiting portions, limiting foods dense in calories and fat, becoming more active, and drinking 8 glasses of water a day (64 ounces) Don't skip meals, especially breakfast, as skipping meals may alter your metabolism Do not use over-the-counter weight loss pills or gimmicks that claim rapid weight loss A healthy BMI (or body mass index) is between 18.5 and 24.9 You can calculate your ideal BMI at the NIH website [employer] We'll refer you to the neurologist if we can't find an obvious cause

## 2017-08-19 LAB — LIPID PANEL
Cholesterol: 187 mg/dL (ref <200)
HDL: 44 mg/dL — ABNORMAL LOW (ref >50)
LDL Cholesterol (Calc): 126 mg/dL (calc) — ABNORMAL HIGH
Non-HDL Cholesterol (Calc): 143 mg/dL (calc) — ABNORMAL HIGH (ref <130)
TRIGLYCERIDES: 80 mg/dL (ref <150)
Total CHOL/HDL Ratio: 4.3 (calc) (ref <5.0)

## 2017-08-19 LAB — CBC WITH DIFFERENTIAL/PLATELET
Basophils Absolute: 52 cells/uL (ref 0–200)
Basophils Relative: 0.6 %
EOS PCT: 2.7 %
Eosinophils Absolute: 232 cells/uL (ref 15–500)
HCT: 34.8 % — ABNORMAL LOW (ref 35.0–45.0)
Hemoglobin: 11.4 g/dL — ABNORMAL LOW (ref 11.7–15.5)
Lymphs Abs: 3113 cells/uL (ref 850–3900)
MCH: 25 pg — ABNORMAL LOW (ref 27.0–33.0)
MCHC: 32.8 g/dL (ref 32.0–36.0)
MCV: 76.3 fL — AB (ref 80.0–100.0)
MPV: 9.8 fL (ref 7.5–12.5)
Monocytes Relative: 9.7 %
NEUTROS PCT: 50.8 %
Neutro Abs: 4369 cells/uL (ref 1500–7800)
PLATELETS: 285 10*3/uL (ref 140–400)
RBC: 4.56 10*6/uL (ref 3.80–5.10)
RDW: 14.4 % (ref 11.0–15.0)
TOTAL LYMPHOCYTE: 36.2 %
WBC: 8.6 10*3/uL (ref 3.8–10.8)
WBCMIX: 834 {cells}/uL (ref 200–950)

## 2017-08-19 LAB — COMPLETE METABOLIC PANEL WITH GFR
AG RATIO: 1.1 (calc) (ref 1.0–2.5)
ALBUMIN MSPROF: 3.9 g/dL (ref 3.6–5.1)
ALKALINE PHOSPHATASE (APISO): 54 U/L (ref 33–130)
ALT: 17 U/L (ref 6–29)
AST: 20 U/L (ref 10–35)
BUN: 14 mg/dL (ref 7–25)
CHLORIDE: 106 mmol/L (ref 98–110)
CO2: 26 mmol/L (ref 20–32)
Calcium: 9.3 mg/dL (ref 8.6–10.4)
Creat: 0.6 mg/dL (ref 0.50–0.99)
GFR, EST AFRICAN AMERICAN: 114 mL/min/{1.73_m2} (ref >=60)
GFR, Est Non African American: 98 mL/min/{1.73_m2} (ref >=60)
GLOBULIN: 3.5 g/dL (ref 1.9–3.7)
Glucose, Bld: 89 mg/dL (ref 65–99)
Potassium: 4 mmol/L (ref 3.5–5.3)
SODIUM: 140 mmol/L (ref 135–146)
Total Bilirubin: 0.3 mg/dL (ref 0.2–1.2)
Total Protein: 7.4 g/dL (ref 6.1–8.1)

## 2017-08-19 LAB — HEMOGLOBIN A1C
EAG (MMOL/L): 6.8 (calc)
Hgb A1c MFr Bld: 5.9 % of total Hgb — ABNORMAL HIGH (ref <5.7)
Mean Plasma Glucose: 123 (calc)

## 2017-08-19 LAB — VITAMIN B12: Vitamin B-12: 399 pg/mL (ref 200–1100)

## 2017-08-19 LAB — TSH: TSH: 2.55 mIU/L (ref 0.40–4.50)

## 2017-08-20 ENCOUNTER — Encounter: Payer: Self-pay | Admitting: Family Medicine

## 2017-08-20 DIAGNOSIS — D649 Anemia, unspecified: Secondary | ICD-10-CM | POA: Insufficient documentation

## 2017-08-20 DIAGNOSIS — E538 Deficiency of other specified B group vitamins: Secondary | ICD-10-CM | POA: Insufficient documentation

## 2017-08-20 DIAGNOSIS — R7303 Prediabetes: Secondary | ICD-10-CM

## 2017-08-20 DIAGNOSIS — D509 Iron deficiency anemia, unspecified: Secondary | ICD-10-CM

## 2017-08-20 HISTORY — DX: Prediabetes: R73.03

## 2017-09-01 ENCOUNTER — Ambulatory Visit: Payer: Self-pay | Admitting: Family Medicine

## 2017-09-07 ENCOUNTER — Ambulatory Visit: Payer: Self-pay | Admitting: Family Medicine

## 2017-09-22 ENCOUNTER — Encounter: Payer: Self-pay | Admitting: Family Medicine

## 2017-09-22 ENCOUNTER — Ambulatory Visit: Payer: BLUE CROSS/BLUE SHIELD | Admitting: Family Medicine

## 2017-09-22 DIAGNOSIS — E785 Hyperlipidemia, unspecified: Secondary | ICD-10-CM | POA: Diagnosis not present

## 2017-09-22 DIAGNOSIS — R7303 Prediabetes: Secondary | ICD-10-CM | POA: Diagnosis not present

## 2017-09-22 DIAGNOSIS — D509 Iron deficiency anemia, unspecified: Secondary | ICD-10-CM | POA: Diagnosis not present

## 2017-09-22 MED ORDER — METFORMIN HCL ER 500 MG PO TB24: ORAL_TABLET | ORAL | 0 refills | Status: DC

## 2017-09-22 MED ORDER — ATORVASTATIN CALCIUM 10 MG PO TABS: 10.0000 mg | ORAL_TABLET | Freq: Every day | ORAL | 2 refills | Status: DC

## 2017-09-22 NOTE — Patient Instructions (Addendum)
I'll recommend that you take a vitamin B12 pill, 250 or 500 mcg by mouth daily  You have prediabetes Limit or avoid foods that have sugars and starches in them This includes white bread, white rice, pasta, potatoes, sugar, sweet drinks, etc  Try to lose 8-10 pounds before your next visit by walking more and eating smaller portions Return in 6 months for recheck of fasting labs  Start a baby 81 mg coated aspirin daily Stop it and let me know if you develop abdominal pain or dark stools  Start the new medicine for high cholesterol called atorvastatin Start the new medicine for prediabetes   Prediabetes Eating Plan Prediabetes-also called impaired glucose tolerance or impaired fasting glucose-is a condition that causes blood sugar (blood glucose) levels to be higher than normal. Following a healthy diet can help to keep prediabetes under control. It can also help to lower the risk of type 2 diabetes and heart disease, which are increased in people who have prediabetes. Along with regular exercise, a healthy diet:  Promotes weight loss.  Helps to control blood sugar levels.  Helps to improve the way that the body uses insulin.  What do I need to know about this eating plan?  Use the glycemic index (GI) to plan your meals. The index tells you how quickly a food will raise your blood sugar. Choose low-GI foods. These foods take a longer time to raise blood sugar.  Pay close attention to the amount of carbohydrates in the food that you eat. Carbohydrates increase blood sugar levels.  Keep track of how many calories you take in. Eating the right amount of calories will help you to achieve a healthy weight. Losing about 7 percent of your starting weight can help to prevent type 2 diabetes.  You may want to follow a Mediterranean diet. This diet includes a lot of vegetables, lean meats or fish, whole grains, fruits, and healthy oils and fats. What foods can I eat? Grains Whole grains, such  as whole-wheat or whole-grain breads, crackers, cereals, and pasta. Unsweetened oatmeal. Bulgur. Barley. Quinoa. Brown rice. Corn or whole-wheat flour tortillas or taco shells. Vegetables Lettuce. Spinach. Peas. Beets. Cauliflower. Cabbage. Broccoli. Carrots. Tomatoes. Squash. Eggplant. Herbs. Peppers. Onions. Cucumbers. Brussels sprouts. Fruits Berries. Bananas. Apples. Oranges. Grapes. Papaya. Mango. Pomegranate. Kiwi. Grapefruit. Cherries. Meats and Other Protein Sources Seafood. Lean meats, such as chicken and [place] or lean cuts of pork and beef. Tofu. Eggs. Nuts. Beans. Dairy Low-fat or fat-free dairy products, such as yogurt, cottage cheese, and cheese. Beverages Water. Tea. Coffee. Sugar-free or diet soda. Seltzer water. Milk. Milk alternatives, such as soy or almond milk. Condiments Mustard. Relish. Low-fat, low-sugar ketchup. Low-fat, low-sugar barbecue sauce. Low-fat or fat-free mayonnaise. Sweets and Desserts Sugar-free or low-fat pudding. Sugar-free or low-fat ice cream and other frozen treats. Fats and Oils Avocado. Walnuts. Olive oil. The items listed above may not be a complete list of recommended foods or beverages. Contact your dietitian for more options. What foods are not recommended? Grains Refined white flour and flour products, such as bread, pasta, snack foods, and cereals. Beverages Sweetened drinks, such as sweet iced tea and soda. Sweets and Desserts Baked goods, such as cake, cupcakes, pastries, cookies, and cheesecake. The items listed above may not be a complete list of foods and beverages to avoid. Contact your dietitian for more information. This information is not intended to replace advice given to you by your health care provider. Make sure you discuss any questions you have  with your health care provider. Document Released: 03/20/2015 Document Revised: 04/10/2016 Document Reviewed: 11/29/2014 Elsevier Interactive Patient Education  2017 [employer]

## 2017-09-22 NOTE — Progress Notes (Signed)
BP 122/60   Pulse 94   Temp 98.1 F (36.7 C) (Oral)   Resp 14   Wt 172 lb 8 oz (78.2 kg)   SpO2 96%   BMI 33.69 kg/m    Subjective:    Patient ID: Kristine Alexander, female    DOB: [birth year], 61 y.o.   MRN: [id number]  HPI: Kristine SaunasGurmej Dubey is a 61 y.o. female  Chief Complaint  Patient presents with  . Follow-up    labs    HPI Patient is here for lab results; we are using phone interpreter (Punjabi); her daughter is here as well  High cholesterol; last LDL was high 126  Prediabetes No fam hx of diabetes No blurred vision; no dry mouth; had surgery on both eyes; one eye still has blurred vision, has to get other eye done No nocturia No craving sweets She asked about portions or elimiating foods with starches I asked about exercise; she used to walk every morning, 30-60 minutes She used to but it cold; offered walking at the mall  Anemia; chronic problem she says  Vitamin B12 deficiency; discussed with her; she is [place]; vegetarian, likes tea with milk  [facility] 09/22/2017 08/18/2017 02/17/2017 01/30/2017 02/01/2016  Decreased Interest 0 0 0 0 0  Down, Depressed, Hopeless 0 0 0 0 0  PHQ - 2 Score 0 0 0 0 0    Relevant past medical, surgical, family and social history reviewed Past Medical History:  Diagnosis Date  . Hyperlipidemia   . Iron deficiency anemia   . Osteoarthritis of multiple joints   . Prediabetes 08/20/2017   Past Surgical History:  Procedure Laterality Date  . TUBAL LIGATION     Family History  Problem Relation Age of Onset  . Hyperlipidemia Mother   . Hyperlipidemia Father   . Hyperlipidemia Sister   . Diabetes Brother   . Hyperlipidemia Brother    Social History   Socioeconomic History  . Marital status: Married    Spouse name: Not on file  . Number of children: Not on file  . Years of education: Not on file  . Highest education level: Not on file  Social Needs  . Financial resource strain: Not on file  . Food  insecurity - worry: Not on file  . Food insecurity - inability: Not on file  . Transportation needs - medical: Not on file  . Transportation needs - non-medical: Not on file  Occupational History  . Not on file  Tobacco Use  . Smoking status: Never Smoker  . Smokeless tobacco: Never Used  Substance and Sexual Activity  . Alcohol use: No    Alcohol/week: 0.0 oz  . Drug use: No  . Sexual activity: No  Other Topics Concern  . Not on file  Social History Narrative  . Not on file    Interim medical history since last visit reviewed. Allergies and medications reviewed  Review of Systems Per HPI unless specifically indicated above     Objective:    BP 122/60   Pulse 94   Temp 98.1 F (36.7 C) (Oral)   Resp 14   Wt 172 lb 8 oz (78.2 kg)   SpO2 96%   BMI 33.69 kg/m   Wt Readings from Last 3 Encounters:  09/22/17 172 lb 8 oz (78.2 kg)  08/18/17 171 lb 12.8 oz (77.9 kg)  02/17/17 170 lb 1.6 oz (77.2 kg)    Physical Exam  Constitutional: She appears well-developed and well-nourished.  HENT:  Mouth/Throat: Mucous membranes are normal.  Eyes: EOM are normal. No scleral icterus.  Cardiovascular: Normal rate.  Pulmonary/Chest: Effort normal. No respiratory distress.  Abdominal: She exhibits no distension.  Musculoskeletal: She exhibits no edema.  Neurological: She is alert.  Skin: No pallor.  Scattered skin tags about the neck  Psychiatric: She has a normal mood and affect. Her behavior is normal. Her mood appears not anxious. She does not exhibit a depressed mood.    Results for orders placed or performed in visit on 08/18/17  COMPLETE METABOLIC PANEL WITH GFR  Result Value Ref Range   Glucose, Bld 89 65 - 99 mg/dL   BUN 14 7 - 25 mg/dL   Creat 1.61 0.96 - 0.45 mg/dL   GFR, Est Non African American 98 > OR = 60 mL/min/1.88m2   GFR, Est African American 114 > OR = 60 mL/min/1.66m2   BUN/Creatinine Ratio NOT APPLICABLE 6 - 22 (calc)   Sodium 140 135 - 146 mmol/L    Potassium 4.0 3.5 - 5.3 mmol/L   Chloride 106 98 - 110 mmol/L   CO2 26 20 - 32 mmol/L   Calcium 9.3 8.6 - 10.4 mg/dL   Total Protein 7.4 6.1 - 8.1 g/dL   Albumin 3.9 3.6 - 5.1 g/dL   Globulin 3.5 1.9 - 3.7 g/dL (calc)   AG Ratio 1.1 1.0 - 2.5 (calc)   Total Bilirubin 0.3 0.2 - 1.2 mg/dL   Alkaline phosphatase (APISO) 54 33 - 130 U/L   AST 20 10 - 35 U/L   ALT 17 6 - 29 U/L  CBC with Differential/Platelet  Result Value Ref Range   WBC 8.6 3.8 - 10.8 Thousand/uL   RBC 4.56 3.80 - 5.10 Million/uL   Hemoglobin 11.4 (L) 11.7 - 15.5 g/dL   HCT 40.9 (L) 81.1 - 91.4 %   MCV 76.3 (L) 80.0 - 100.0 fL   MCH 25.0 (L) 27.0 - 33.0 pg   MCHC 32.8 32.0 - 36.0 g/dL   RDW 78.2 95.6 - 21.3 %   Platelets 285 140 - 400 Thousand/uL   MPV 9.8 7.5 - 12.5 fL   Neutro Abs 4,369 1,500 - 7,800 cells/uL   Lymphs Abs 3,113 850 - 3,900 cells/uL   WBC mixed population 834 200 - 950 cells/uL   Eosinophils Absolute 232 15 - 500 cells/uL   Basophils Absolute 52 0 - 200 cells/uL   Neutrophils Relative % 50.8 %   Total Lymphocyte 36.2 %   Monocytes Relative 9.7 %   Eosinophils Relative 2.7 %   Basophils Relative 0.6 %  Lipid panel  Result Value Ref Range   Cholesterol 187 <200 mg/dL   HDL 44 (L) >08 mg/dL   Triglycerides 80 <657 mg/dL   LDL Cholesterol (Calc) 126 (H) mg/dL (calc)   Total CHOL/HDL Ratio 4.3 <5.0 (calc)   Non-HDL Cholesterol (Calc) 143 (H) <130 mg/dL (calc)  TSH  Result Value Ref Range   TSH 2.55 0.40 - 4.50 mIU/L  Hemoglobin A1c  Result Value Ref Range   Hgb A1c MFr Bld 5.9 (H) <5.7 % of total Hgb   Mean Plasma Glucose 123 (calc)   eAG (mmol/L) 6.8 (calc)  B12  Result Value Ref Range   Vitamin B-12 399 200 - 1,100 pg/mL      Assessment & Plan:   Problem List Items Addressed This Visit      Other   Prediabetes    Discussed diagnosis of prediabetes; discussed foods to try to  eat, avoid; weight loss encouraged; she agrees to starting metformin      Microcytic anemia     Patient reports this is stable, chronic; does not desire work-up      Hyperlipidemia LDL goal <100    Discussed; medication for lipids discussed; dietary recommendatins discussed      Relevant Medications   atorvastatin (LIPITOR) 10 MG tablet   aspirin EC 81 MG tablet       Follow up plan: Return in about 3 months (around 12/23/2017) for fasting labs only; 6 months with Dr. Sherie DonLada.  An after-visit summary was printed and given to the patient at check-out.  Please see the patient instructions which may contain other information and recommendations beyond what is mentioned above in the assessment and plan.  Meds ordered this encounter  Medications  . metFORMIN (GLUCOPHAGE XR) 500 MG 24 hr tablet    Sig: One by mouth daily for one week, then two pills daily    Dispense:  53 tablet    Refill:  0  . atorvastatin (LIPITOR) 10 MG tablet    Sig: Take 1 tablet (10 mg total) at bedtime by mouth.    Dispense:  30 tablet    Refill:  2  . aspirin EC 81 MG tablet    Sig: Take 1 tablet (81 mg total) daily by mouth.    No orders of the defined types were placed in this encounter.    Use of phone interpreter prolonged the visit Face-to-face time with patient was more than 25 minutes, >50% time spent counseling and coordination of care

## 2017-09-25 NOTE — Assessment & Plan Note (Signed)
Patient reports this is stable, chronic; does not desire work-up

## 2017-09-25 NOTE — Assessment & Plan Note (Signed)
Discussed; medication for lipids discussed; dietary recommendatins discussed

## 2017-09-25 NOTE — Assessment & Plan Note (Signed)
Discussed diagnosis of prediabetes; discussed foods to try to eat, avoid; weight loss encouraged; she agrees to starting metformin

## 2017-11-05 ENCOUNTER — Other Ambulatory Visit: Payer: Self-pay | Admitting: Family Medicine

## 2017-11-05 NOTE — Telephone Encounter (Signed)
rx approved

## 2017-12-22 ENCOUNTER — Other Ambulatory Visit: Payer: Self-pay

## 2017-12-22 ENCOUNTER — Other Ambulatory Visit: Payer: Self-pay | Admitting: Family Medicine

## 2017-12-22 DIAGNOSIS — E785 Hyperlipidemia, unspecified: Secondary | ICD-10-CM

## 2017-12-22 DIAGNOSIS — Z5181 Encounter for therapeutic drug level monitoring: Secondary | ICD-10-CM

## 2017-12-22 NOTE — Telephone Encounter (Signed)
Pt daughter notified   

## 2017-12-22 NOTE — Telephone Encounter (Signed)
Refill sent of cholesterol medicine Please ask pt to come in later this week for cholesterol; she does not have to fast if inconvenient (her TG were normal)

## 2017-12-24 LAB — LIPID PANEL
CHOLESTEROL: 148 mg/dL (ref <200)
HDL: 48 mg/dL — ABNORMAL LOW (ref >50)
LDL Cholesterol (Calc): 85 mg/dL (calc)
Non-HDL Cholesterol (Calc): 100 mg/dL (calc) (ref <130)
Total CHOL/HDL Ratio: 3.1 (calc) (ref <5.0)
Triglycerides: 68 mg/dL (ref <150)

## 2017-12-24 LAB — ALT: ALT: 15 U/L (ref 6–29)

## 2017-12-25 ENCOUNTER — Telehealth: Payer: Self-pay | Admitting: Family Medicine

## 2017-12-25 ENCOUNTER — Other Ambulatory Visit: Payer: Self-pay | Admitting: Family Medicine

## 2017-12-25 MED ORDER — ATORVASTATIN CALCIUM 10 MG PO TABS: ORAL_TABLET | ORAL | 11 refills | Status: DC

## 2017-12-25 NOTE — Progress Notes (Signed)
Refill of statin.

## 2017-12-25 NOTE — Telephone Encounter (Signed)
Pt returned call and lab results given to her with the recommendation as request. Pt voiced understanding.

## 2018-02-01 ENCOUNTER — Ambulatory Visit: Payer: BLUE CROSS/BLUE SHIELD | Admitting: Family Medicine

## 2018-02-01 ENCOUNTER — Encounter: Payer: Self-pay | Admitting: Family Medicine

## 2018-02-01 VITALS — BP 120/80 | HR 88 | Temp 98.6°F | Ht 60.0 in | Wt 168.0 lb

## 2018-02-01 DIAGNOSIS — B349 Viral infection, unspecified: Secondary | ICD-10-CM | POA: Diagnosis not present

## 2018-02-01 MED ORDER — BENZONATATE 100 MG PO CAPS: 100.0000 mg | ORAL_CAPSULE | Freq: Three times a day (TID) | ORAL | 0 refills | Status: DC | PRN

## 2018-02-01 MED ORDER — PROMETHAZINE-DM 6.25-15 MG/5ML PO SYRP: 2.5000 mL | ORAL_SOLUTION | Freq: Four times a day (QID) | ORAL | 0 refills | Status: DC | PRN

## 2018-02-01 MED ORDER — ATORVASTATIN CALCIUM 10 MG PO TABS: ORAL_TABLET | ORAL | 11 refills | Status: DC

## 2018-02-01 NOTE — Progress Notes (Signed)
BP 120/80 (BP Location: Right Arm, Patient Position: Sitting, Cuff Size: Large)   Pulse 88   Temp 98.6 F (37 C) (Oral)   Ht 5' (1.524 m)   Wt 168 lb (76.2 kg)   SpO2 98%   BMI 32.81 kg/m    Subjective:    Patient ID: Kristine Alexander, female    DOB: February 25, 1956, 62 y.o.   MRN: [id number]  HPI: Kristine SaunasGurmej Wilbourn is a 62 y.o. female  Chief Complaint  Patient presents with  . Chills  . Sore Throat    x 3 days     HPI Patient presents to clinic with sore throat ongoing for 2 day and noted yesterday she had chills. Pt endorses dry cough and 2 episodes of emesis- after drinking green tea yesterday morning. Endorses nausea this morning no emesis. Patient has tried took tylenol and tylenol PM. Pt denies trouble swallowing. Positive sick contacts: her grandkids have had ear infection and URI symptoms.    Denies diarrhea, sinus pain or teeth pain, ear pain/discharge.   Denies body aches, rash,  shortness of breath, chest pain, wheezing  She did get the flu shot in October   Relevant past medical, surgical, family and social history reviewed Past Medical History:  Diagnosis Date  . Hyperlipidemia   . Iron deficiency anemia   . Osteoarthritis of multiple joints   . Prediabetes 08/20/2017   Past Surgical History:  Procedure Laterality Date  . CATARACT EXTRACTION W/PHACO Right 02/25/2016   Procedure: CATARACT EXTRACTION PHACO AND INTRAOCULAR LENS PLACEMENT (IOC);  Surgeon: Sallee LangeSteven Dingeldein, MD;  Location: [facility] ORS;  Service: Ophthalmology;  Laterality: Right;  US    00:48.2 AP%   23.5 CDE   22.58 fluid pack lot # [id number]  . TUBAL LIGATION     Family History  Problem Relation Age of Onset  . Hyperlipidemia Mother   . Hyperlipidemia Father   . Hyperlipidemia Sister   . Diabetes Brother   . Hyperlipidemia Brother    Social History   Tobacco Use  . Smoking status: Never Smoker  . Smokeless tobacco: Never Used  Substance Use Topics  . Alcohol use: No    Alcohol/week: 0.0  oz  . Drug use: No    Interim medical history since last visit reviewed. Allergies and medications reviewed  Review of Systems Per HPI unless specifically indicated above     Objective:    BP 120/80 (BP Location: Right Arm, Patient Position: Sitting, Cuff Size: Large)   Pulse 88   Temp 98.6 F (37 C) (Oral)   Ht 5' (1.524 m)   Wt 168 lb (76.2 kg)   SpO2 98%   BMI 32.81 kg/m   Wt Readings from Last 3 Encounters:  02/01/18 168 lb (76.2 kg)  09/22/17 172 lb 8 oz (78.2 kg)  08/18/17 171 lb 12.8 oz (77.9 kg)    Physical Exam  Constitutional: She is oriented to person, place, and time. She appears well-developed and well-nourished.  HENT:  Head: Normocephalic and atraumatic.  Right Ear: External ear normal.  Left Ear: External ear normal.  Nose: No rhinorrhea. Right sinus exhibits no maxillary sinus tenderness and no frontal sinus tenderness. Left sinus exhibits no maxillary sinus tenderness and no frontal sinus tenderness.  Mouth/Throat: Uvula is midline, oropharynx is clear and moist and mucous membranes are normal. No oropharyngeal exudate.  Eyes: Conjunctivae and EOM are normal. Pupils are equal, round, and reactive to light.  Neck: Normal range of motion. Neck supple.  Cardiovascular: Normal  rate and regular rhythm.  Pulmonary/Chest: Effort normal and breath sounds normal. She has no wheezes.  Abdominal: Soft. Bowel sounds are normal. There is no tenderness.  Musculoskeletal: Normal range of motion.  Lymphadenopathy:    She has no cervical adenopathy.  Neurological: She is alert and oriented to person, place, and time.  Skin: Skin is warm and dry. No rash noted.  Psychiatric: She has a normal mood and affect. Her behavior is normal. Judgment and thought content normal.   Diabetic Foot Form - Detailed   Diabetic Foot Exam - detailed Diabetic Foot exam was performed with the following findings:  Yes 02/01/2018  9:11 AM  Visual Foot Exam completed.:  Yes  Pulse Foot Exam  completed.:  Yes  Right posterior Tibialias:  Present Left posterior Tibialias:  Present  Right Dorsalis Pedis:  Present Left Dorsalis Pedis:  Present  Sensory Foot Exam Completed.:  Yes Semmes-Weinstein Monofilament Test       Results for orders placed or performed in visit on 12/22/17  ALT  Result Value Ref Range   ALT 15 6 - 29 U/L  Lipid panel  Result Value Ref Range   Cholesterol 148 <200 mg/dL   HDL 48 (L) >16 mg/dL   Triglycerides 68 <109 mg/dL   LDL Cholesterol (Calc) 85 mg/dL (calc)   Total CHOL/HDL Ratio 3.1 <5.0 (calc)   Non-HDL Cholesterol (Calc) 100 <130 mg/dL (calc)      Assessment & Plan:   Problem List Items Addressed This Visit    None    Visit Diagnoses    Viral syndrome    -  Primary       Follow up plan: No Follow-up on file.  An after-visit summary was printed and given to the patient at check-out.  Please see the patient instructions which may contain other information and recommendations beyond what is mentioned above in the assessment and plan.  Meds ordered this encounter  Medications  . benzonatate (TESSALON) 100 MG capsule    Sig: Take 1 capsule (100 mg total) by mouth 3 (three) times daily as needed for cough.    Dispense:  20 capsule    Refill:  0  . promethazine-dextromethorphan (PROMETHAZINE-DM) 6.25-15 MG/5ML syrup    Sig: Take 2.5-5 mLs by mouth 4 (four) times daily as needed for cough.    Dispense:  118 mL    Refill:  0  . atorvastatin (LIPITOR) 10 MG tablet    Sig: TAKE 1 TABLET(10 MG) BY MOUTH AT BEDTIME    Dispense:  30 tablet    Refill:  11    No orders of the defined types were placed in this encounter.

## 2018-02-01 NOTE — Patient Instructions (Addendum)
Try vitamin C (orange juice if not diabetic or vitamin C tablets) and drink green tea to help your immune system during your illness Get plenty of rest and hydration For the cough, you can use Tessalon Perles For nausea, you can use phenergan-DM which will also help some with cough You can use over-the-counter sprays for your sore throat or salt water gargles Honey can be helpful too, along with lemon in tea Call if you are getting worse or have any concerns, chest pain, worsening cough, high fever, etc. You can take tylenol per package directions for any fever or aches Do not take the metformin if dehydrated or vomiting a lot  Viral Illness, Adult Viruses are tiny germs that can get into a person's body and cause illness. There are many different types of viruses, and they cause many types of illness. Viral illnesses can range from mild to severe. They can affect various parts of the body. Common illnesses that are caused by a virus include colds and the flu. Viral illnesses also include serious conditions such as HIV/AIDS (human immunodeficiency virus/acquired immunodeficiency syndrome). A few viruses have been linked to certain cancers. What are the causes? Many types of viruses can cause illness. Viruses invade cells in your body, multiply, and cause the infected cells to malfunction or die. When the cell dies, it releases more of the virus. When this happens, you develop symptoms of the illness, and the virus continues to spread to other cells. If the virus takes over the function of the cell, it can cause the cell to divide and grow out of control, as is the case when a virus causes cancer. Different viruses get into the body in different ways. You can get a virus by:  Swallowing food or water that is contaminated with the virus.  Breathing in droplets that have been coughed or sneezed into the air by an infected person.  Touching a surface that has been contaminated with the virus and  then touching your eyes, nose, or mouth.  Being bitten by an insect or animal that carries the virus.  Having sexual contact with a person who is infected with the virus.  Being exposed to blood or fluids that contain the virus, either through an open cut or during a transfusion.  If a virus enters your body, your body's defense system (immune system) will try to fight the virus. You may be at higher risk for a viral illness if your immune system is weak. What are the signs or symptoms? Symptoms vary depending on the type of virus and the location of the cells that it invades. Common symptoms of the main types of viral illnesses include: Cold and flu viruses  Fever.  Headache.  Sore throat.  Muscle aches.  Nasal congestion.  Cough. Digestive system (gastrointestinal) viruses  Fever.  Abdominal pain.  Nausea.  Diarrhea. Liver viruses (hepatitis)  Loss of appetite.  Tiredness.  Yellowing of the skin (jaundice). Brain and spinal cord viruses  Fever.  Headache.  Stiff neck.  Nausea and vomiting.  Confusion or sleepiness. Skin viruses  Warts.  Itching.  Rash. Sexually transmitted viruses  Discharge.  Swelling.  Redness.  Rash. How is this treated? Viruses can be difficult to treat because they live within cells. Antibiotic medicines do not treat viruses because these drugs do not get inside cells. Treatment for a viral illness may include:  Resting and drinking plenty of fluids.  Medicines to relieve symptoms. These can include over-the-counter medicine for  pain and fever, medicines for cough or congestion, and medicines to relieve diarrhea.  Antiviral medicines. These drugs are available only for certain types of viruses. They may help reduce flu symptoms if taken early. There are also many antiviral medicines for hepatitis and HIV/AIDS.  Some viral illnesses can be prevented with vaccinations. A common example is the flu shot. Follow these  instructions at home: Medicines   Take over-the-counter and prescription medicines only as told by your health care provider.  If you were prescribed an antiviral medicine, take it as told by your health care provider. Do not stop taking the medicine even if you start to feel better.  Be aware of when antibiotics are needed and when they are not needed. Antibiotics do not treat viruses. If your health care provider thinks that you may have a bacterial infection as well as a viral infection, you may get an antibiotic. ? Do not ask for an antibiotic prescription if you have been diagnosed with a viral illness. That will not make your illness go away faster. ? Frequently taking antibiotics when they are not needed can lead to antibiotic resistance. When this develops, the medicine no longer works against the bacteria that it normally fights. General instructions  Drink enough fluids to keep your urine clear or pale yellow.  Rest as much as possible.  Return to your normal activities as told by your health care provider. Ask your health care provider what activities are safe for you.  Keep all follow-up visits as told by your health care provider. This is important. How is this prevented? Take these actions to reduce your risk of viral infection:  Eat a healthy diet and get enough rest.  Wash your hands often with soap and water. This is especially important when you are in public places. If soap and water are not available, use hand sanitizer.  Avoid close contact with friends and family who have a viral illness.  If you travel to areas where viral gastrointestinal infection is common, avoid drinking water or eating raw food.  Keep your immunizations up to date. Get a flu shot every year as told by your health care provider.  Do not share toothbrushes, nail clippers, razors, or needles with other people.  Always practice safe sex.  Contact a health care provider if:  You have  symptoms of a viral illness that do not go away.  Your symptoms come back after going away.  Your symptoms get worse. Get help right away if:  You have trouble breathing.  You have a severe headache or a stiff neck.  You have severe vomiting or abdominal pain. This information is not intended to replace advice given to you by your health care provider. Make sure you discuss any questions you have with your health care provider. Document Released: 03/14/2016 Document Revised: 04/16/2016 Document Reviewed: 03/14/2016 Elsevier Interactive Patient Education  [employer].

## 2018-02-18 ENCOUNTER — Encounter: Payer: Self-pay | Admitting: Family Medicine

## 2018-02-18 ENCOUNTER — Ambulatory Visit: Payer: BLUE CROSS/BLUE SHIELD | Admitting: Family Medicine

## 2018-02-18 VITALS — BP 120/82 | HR 98 | Temp 98.5°F | Ht 60.0 in | Wt 165.4 lb

## 2018-02-18 DIAGNOSIS — Z1231 Encounter for screening mammogram for malignant neoplasm of breast: Secondary | ICD-10-CM | POA: Diagnosis not present

## 2018-02-18 DIAGNOSIS — Z5181 Encounter for therapeutic drug level monitoring: Secondary | ICD-10-CM | POA: Diagnosis not present

## 2018-02-18 DIAGNOSIS — R7303 Prediabetes: Secondary | ICD-10-CM

## 2018-02-18 DIAGNOSIS — H1013 Acute atopic conjunctivitis, bilateral: Secondary | ICD-10-CM

## 2018-02-18 DIAGNOSIS — E785 Hyperlipidemia, unspecified: Secondary | ICD-10-CM

## 2018-02-18 DIAGNOSIS — Z1211 Encounter for screening for malignant neoplasm of colon: Secondary | ICD-10-CM

## 2018-02-18 DIAGNOSIS — E669 Obesity, unspecified: Secondary | ICD-10-CM | POA: Diagnosis not present

## 2018-02-18 MED ORDER — OLOPATADINE HCL 0.2 % OP SOLN: OPHTHALMIC | 11 refills | Status: DC

## 2018-02-18 NOTE — Patient Instructions (Addendum)
I think you have tendonitis Use ice topically for 15-20 minutes at a time, 3-4 times a day Use aleve 220 mg, take two pills every twelve hours for five days  Keep up the great job with losing weight! I am so proud of you Try the new eye drops  Tendinitis Tendinitis is inflammation of a tendon. A tendon is a strong cord of tissue that connects muscle to bone. Tendinitis can affect any tendon, but it most commonly affects the shoulder tendon (rotator cuff), ankle tendon (Achilles tendon), elbow tendon (triceps tendon), or one of the tendons in the wrist. What are the causes? This condition may be caused by:  Overusing a tendon or muscle. This is common.  Age-related wear and tear.  Injury.  Inflammatory conditions, such as arthritis.  Certain medicines.  What increases the risk? This condition is more likely to develop in people who do activities that involve repetitive motions. What are the signs or symptoms? Symptoms of this condition may include:  Pain.  Tenderness.  Mild swelling.  How is this diagnosed? This condition is diagnosed with a physical exam. You may also have tests, such as:  Ultrasound. This uses sound waves to make an image of your affected area.  MRI.  How is this treated? This condition may be treated by resting, icing, applying pressure (compression), and raising (elevating) the area above the level of your heart. This is known as RICE therapy. Treatment may also include:  Medicines to help reduce inflammation or to help reduce pain.  Exercises or physical therapy to strengthen and stretch the tendon.  A brace or splint.  Surgery (rare).  Follow these instructions at home:  If you have a splint or brace:  Wear the splint or brace as told by your health care provider. Remove it only as told by your health care provider.  Loosen the splint or brace if your fingers or toes tingle, become numb, or turn cold and blue.  Do not take baths, swim,  or use a hot tub until your health care provider approves. Ask your health care provider if you can take showers. You may only be allowed to take sponge baths for bathing.  Do not let your splint or brace get wet if it is not waterproof. ? If your splint or brace is not waterproof, cover it with a watertight plastic bag when you take a bath or a shower.  Keep the splint or brace clean. Managing pain, stiffness, and swelling  If directed, apply ice to the affected area. ? Put ice in a plastic bag. ? Place a towel between your skin and the bag. ? Leave the ice on for 20 minutes, 2-3 times a day.  If directed, apply heat to the affected area as often as told by your health care provider. Use the heat source that your health care provider recommends, such as a moist heat pack or a heating pad. ? Place a towel between your skin and the heat source. ? Leave the heat on for 20-30 minutes. ? Remove the heat if your skin turns bright red. This is especially important if you are unable to feel pain, heat, or cold. You may have a greater risk of getting burned.  Move the fingers or toes of the affected limb often, if this applies. This can help to prevent stiffness and lessen swelling.  If directed, elevate the affected area above the level of your heart while you are sitting or lying down. Driving  Do not drive or operate heavy machinery while taking prescription pain medicine.  Ask your health care provider when it is safe to drive if you have a splint or brace on any part of your arm or leg. Activity  Return to your normal activities as told by your health care provider. Ask your health care provider what activities are safe for you.  Rest the affected area as told by your health care provider.  Avoid using the affected area while you are experiencing symptoms of tendinitis.  Do exercises as told by your health care provider. General instructions  If you have a splint, do not put  pressure on any part of the splint until it is fully hardened. This may take several hours.  Wear an elastic bandage or compression wrap only as told by your health care provider.  Take over-the-counter and prescription medicines only as told by your health care provider.  Keep all follow-up visits as told by your health care provider. This is important. Contact a health care provider if:  Your symptoms do not improve.  You develop new, unexplained problems, such as numbness in your hands. This information is not intended to replace advice given to you by your health care provider. Make sure you discuss any questions you have with your health care provider. Document Released: 10/31/2000 Document Revised: 07/03/2016 Document Reviewed: 08/06/2015 Elsevier Interactive Patient Education  [employer]2018 [employer] Inc[employer]

## 2018-02-18 NOTE — Progress Notes (Signed)
BP 120/82 (BP Location: Right Arm, Patient Position: Sitting, Cuff Size: Large)   Pulse 98   Temp 98.5 F (36.9 C) (Oral)   Ht 5' (1.524 m)   Wt 165 lb 6.4 oz (75 kg)   SpO2 99%   BMI 32.30 kg/m    Subjective:    Patient ID: Kristine Alexander, female    DOB: 23-Nov-1955, 62 y.o.   MRN: [id number]  HPI: Kristine Alexander is a 62 y.o. female  Chief Complaint  Patient presents with  . Follow-up  . Allergies    HPI Patient is here for f/u; language line interpreter used, Punjabi  Prediabetes No problems with the feet; trying to eat better  She has pain in her left arm; above the elbow; sudden pain there; pain is there, then suddenly; sharp pain; very fast; nothing brings the pain on; nothing helps the pain go away; right handed  She also has pain in the left groin; now good  Obesity; she has lost 7 pounds since November; morning, patient has warm glass of water, then lemon and black salt and turmeric; that helps curb her appetite  Itchy watery eyes; both eyes; thinks allergies; lots of water; little bit of blurry vision; no eye doctor  Depression screen [facility] 2/9 02/18/2018 02/01/2018 09/22/2017 08/18/2017 02/17/2017  Decreased Interest 0 0 0 0 0  Down, Depressed, Hopeless 0 0 0 0 0  PHQ - 2 Score 0 0 0 0 0   Relevant past medical, surgical, family and social history reviewed Past Medical History:  Diagnosis Date  . Hyperlipidemia   . Iron deficiency anemia   . Osteoarthritis of multiple joints   . Prediabetes 08/20/2017   Past Surgical History:  Procedure Laterality Date  . CATARACT EXTRACTION W/PHACO Right 02/25/2016   Procedure: CATARACT EXTRACTION PHACO AND INTRAOCULAR LENS PLACEMENT (IOC);  Surgeon: Sallee Lange, MD;  Location: [facility] ORS;  Service: Ophthalmology;  Laterality: Right;  [place]    00:48.2 AP%   23.5 CDE   22.58 fluid pack lot # [id number]  . TUBAL LIGATION     Family History  Problem Relation Age of Onset  . Hyperlipidemia Mother   . Hyperlipidemia Father     . Hyperlipidemia Sister   . Diabetes Brother   . Hyperlipidemia Brother    Social History   Tobacco Use  . Smoking status: Never Smoker  . Smokeless tobacco: Never Used  Substance Use Topics  . Alcohol use: No    Alcohol/week: 0.0 oz  . Drug use: No    Interim medical history since last visit reviewed. Allergies and medications reviewed  Review of Systems Per HPI unless specifically indicated above     Objective:    BP 120/82 (BP Location: Right Arm, Patient Position: Sitting, Cuff Size: Large)   Pulse 98   Temp 98.5 F (36.9 C) (Oral)   Ht 5' (1.524 m)   Wt 165 lb 6.4 oz (75 kg)   SpO2 99%   BMI 32.30 kg/m   Wt Readings from Last 3 Encounters:  02/18/18 165 lb 6.4 oz (75 kg)  02/01/18 168 lb (76.2 kg)  09/22/17 172 lb 8 oz (78.2 kg)    Physical Exam  Constitutional: She appears well-developed and well-nourished. No distress.  HENT:  Head: Normocephalic and atraumatic.  Eyes: EOM are normal. No scleral icterus.  Neck: No thyromegaly present.  Cardiovascular: Normal rate, regular rhythm and normal heart sounds.  No murmur heard. Pulmonary/Chest: Effort normal and breath sounds normal.  Abdominal: Soft.  Bowel sounds are normal. She exhibits no distension.  Musculoskeletal: She exhibits no edema.  Neurological: She is alert.  Skin: Skin is warm and dry. She is not diaphoretic. No pallor.  Psychiatric: She has a normal mood and affect. Her behavior is normal. Judgment and thought content normal.   Diabetic Foot Form - Detailed   Diabetic Foot Exam - detailed Diabetic Foot exam was performed with the following findings:  Yes 02/18/2018  1:09 PM  Visual Foot Exam completed.:  Yes  Pulse Foot Exam completed.:  Yes  Right Dorsalis Pedis:  Present Left Dorsalis Pedis:  Present  Sensory Foot Exam Completed.:  Yes Semmes-Weinstein Monofilament Test R Site 1-Great Toe:  Pos L Site 1-Great Toe:  Pos          Assessment & Plan:   Problem List Items Addressed This  Visit      Other   Prediabetes - Primary    Check A1c today      Relevant Orders   Hemoglobin A1c (Completed)   Obesity, Class I, BMI 30-34.9    Praise given      Medication monitoring encounter    Check liver and kidneys      Relevant Orders   COMPLETE METABOLIC PANEL WITH GFR (Completed)   Hyperlipidemia LDL goal <100    Check cholesterol today      Relevant Orders   Lipid panel (Completed)   Allergic conjunctivitis of both eyes    Other Visit Diagnoses    Encounter for screening colonoscopy       Relevant Orders   Ambulatory referral to Gastroenterology   Encounter for screening mammogram for breast cancer       Relevant Orders   MM DIGITAL SCREENING BILATERAL       Follow up plan: Return in about 6 months (around 08/20/2018) for follow-up visit with Dr. Sherie DonLada.  An after-visit summary was printed and given to the patient at check-out.  Please see the patient instructions which may contain other information and recommendations beyond what is mentioned above in the assessment and plan.  Meds ordered this encounter  Medications  . Olopatadine HCl 0.2 % SOLN    Sig: One drop in each eye every day    Dispense:  2.5 mL    Refill:  11    Orders Placed This Encounter  Procedures  . MM DIGITAL SCREENING BILATERAL  . COMPLETE METABOLIC PANEL WITH GFR  . Lipid panel  . Hemoglobin A1c  . Ambulatory referral to Gastroenterology

## 2018-02-18 NOTE — Assessment & Plan Note (Signed)
Check cholesterol today 

## 2018-02-18 NOTE — Assessment & Plan Note (Signed)
Check liver and kidneys 

## 2018-02-18 NOTE — Assessment & Plan Note (Signed)
Check A1c today.

## 2018-02-18 NOTE — Assessment & Plan Note (Signed)
Praise given 

## 2018-02-19 LAB — COMPLETE METABOLIC PANEL WITH GFR
AG Ratio: 1.1 (calc) (ref 1.0–2.5)
ALBUMIN MSPROF: 4.1 g/dL (ref 3.6–5.1)
ALT: 15 U/L (ref 6–29)
AST: 18 U/L (ref 10–35)
Alkaline phosphatase (APISO): 58 U/L (ref 33–130)
BUN: 13 mg/dL (ref 7–25)
CALCIUM: 9.2 mg/dL (ref 8.6–10.4)
CO2: 26 mmol/L (ref 20–32)
Chloride: 107 mmol/L (ref 98–110)
Creat: 0.53 mg/dL (ref 0.50–0.99)
GFR, EST AFRICAN AMERICAN: 118 mL/min/{1.73_m2} (ref >=60)
GFR, EST NON AFRICAN AMERICAN: 102 mL/min/{1.73_m2} (ref >=60)
Globulin: 3.7 g/dL (calc) (ref 1.9–3.7)
Glucose, Bld: 88 mg/dL (ref 65–99)
Potassium: 4.2 mmol/L (ref 3.5–5.3)
Sodium: 139 mmol/L (ref 135–146)
TOTAL PROTEIN: 7.8 g/dL (ref 6.1–8.1)
Total Bilirubin: 0.3 mg/dL (ref 0.2–1.2)

## 2018-02-19 LAB — LIPID PANEL
Cholesterol: 153 mg/dL (ref <200)
HDL: 46 mg/dL — ABNORMAL LOW (ref >50)
LDL CHOLESTEROL (CALC): 91 mg/dL
NON-HDL CHOLESTEROL (CALC): 107 mg/dL (ref <130)
TRIGLYCERIDES: 72 mg/dL (ref <150)
Total CHOL/HDL Ratio: 3.3 (calc) (ref <5.0)

## 2018-02-19 LAB — HEMOGLOBIN A1C
Hgb A1c MFr Bld: 5.9 % of total Hgb — ABNORMAL HIGH (ref <5.7)
Mean Plasma Glucose: 123 (calc)
eAG (mmol/L): 6.8 (calc)

## 2018-02-23 ENCOUNTER — Telehealth: Payer: Self-pay | Admitting: Family Medicine

## 2018-02-23 NOTE — Telephone Encounter (Signed)
Copied from CRM [phone]. Topic: Quick Communication - See Telephone Encounter >> Feb 23, 2018 11:48 AM Clack, Princella PellegriniJessica D wrote: CRM for notification. See Telephone encounter for: 02/23/18.  Pt daughter Agnes LawrenceManjit, calling for lab results.  Please f/u at [phone]

## 2018-02-23 NOTE — Telephone Encounter (Signed)
Thank you; I reviewed the labs and sent remarks attached to the labs

## 2018-02-23 NOTE — Telephone Encounter (Signed)
Please review labs and advise, thanks!

## 2018-02-24 ENCOUNTER — Telehealth: Payer: Self-pay | Admitting: Family Medicine

## 2018-02-24 NOTE — Telephone Encounter (Unsigned)
Copied from CRM [phone]. Topic: Quick Communication - Lab Results >> Feb 24, 2018  9:59 AM Percival SpanishKennedy, Cheryl W wrote:  Pt call for lab results and Triage line busy

## 2018-02-24 NOTE — Telephone Encounter (Signed)
Called  Left VM for lab results  Advised  Pt to call back

## 2018-02-24 NOTE — Telephone Encounter (Signed)
Lab  Result  Note  Is  In Pec  Result  Que

## 2018-04-07 ENCOUNTER — Encounter: Payer: Self-pay | Admitting: *Deleted

## 2018-04-20 LAB — HM DIABETES EYE EXAM

## 2018-05-11 ENCOUNTER — Other Ambulatory Visit: Payer: Self-pay | Admitting: Family Medicine

## 2018-05-14 ENCOUNTER — Other Ambulatory Visit: Payer: Self-pay | Admitting: Family Medicine

## 2018-06-03 ENCOUNTER — Encounter: Payer: Self-pay | Admitting: *Deleted

## 2018-06-08 ENCOUNTER — Ambulatory Visit: Payer: BLUE CROSS/BLUE SHIELD | Admitting: Anesthesiology

## 2018-06-08 ENCOUNTER — Encounter
Admission: RE | Disposition: A | Discharge status: Home / Self Care | Payer: Self-pay | Source: Ambulatory Visit | Attending: Ophthalmology

## 2018-06-08 ENCOUNTER — Ambulatory Visit
Admission: RE | Admit: 2018-06-08 | Discharge: 2018-06-08 | Disposition: A | Discharge status: Home / Self Care | Payer: BLUE CROSS/BLUE SHIELD | Source: Ambulatory Visit | Attending: Ophthalmology | Admitting: Ophthalmology

## 2018-06-08 DIAGNOSIS — E7800 Pure hypercholesterolemia, unspecified: Secondary | ICD-10-CM | POA: Diagnosis not present

## 2018-06-08 DIAGNOSIS — E119 Type 2 diabetes mellitus without complications: Secondary | ICD-10-CM | POA: Diagnosis not present

## 2018-06-08 DIAGNOSIS — Z79899 Other long term (current) drug therapy: Secondary | ICD-10-CM | POA: Diagnosis not present

## 2018-06-08 DIAGNOSIS — M1990 Unspecified osteoarthritis, unspecified site: Secondary | ICD-10-CM | POA: Diagnosis not present

## 2018-06-08 DIAGNOSIS — Z7984 Long term (current) use of oral hypoglycemic drugs: Secondary | ICD-10-CM | POA: Diagnosis not present

## 2018-06-08 DIAGNOSIS — H2512 Age-related nuclear cataract, left eye: Secondary | ICD-10-CM | POA: Insufficient documentation

## 2018-06-08 HISTORY — PX: CATARACT EXTRACTION W/PHACO: SHX586

## 2018-06-08 HISTORY — DX: Type 2 diabetes mellitus without complications: E11.9

## 2018-06-08 LAB — GLUCOSE, CAPILLARY: Glucose-Capillary: 91 mg/dL (ref 70–99)

## 2018-06-08 SURGERY — PHACOEMULSIFICATION, CATARACT, WITH IOL INSERTION
Anesthesia: Monitor Anesthesia Care | Site: Eye | Laterality: Left | Wound class: "Clean "

## 2018-06-08 MED ORDER — MOXIFLOXACIN HCL 0.5 % OP SOLN
OPHTHALMIC | Status: AC
  Filled 2018-06-08: qty 3

## 2018-06-08 MED ORDER — NA CHONDROIT SULF-NA HYALURON 40-17 MG/ML IO SOLN
INTRAOCULAR | Status: DC | PRN
  Administered 2018-06-08: 1 mL via INTRAOCULAR

## 2018-06-08 MED ORDER — MIDAZOLAM HCL 2 MG/2ML IJ SOLN
INTRAMUSCULAR | Status: AC
  Filled 2018-06-08: qty 2

## 2018-06-08 MED ORDER — CYCLOPENTOLATE HCL 2 % OP SOLN
OPHTHALMIC | Status: AC
  Administered 2018-06-08: 1 [drp] via OPHTHALMIC
  Filled 2018-06-08: qty 2

## 2018-06-08 MED ORDER — EPINEPHRINE PF 1 MG/ML IJ SOLN
INTRAOCULAR | Status: DC | PRN
  Administered 2018-06-08: 11:00:00 via OPHTHALMIC

## 2018-06-08 MED ORDER — SODIUM CHLORIDE 0.9 % IV SOLN: INTRAVENOUS | Status: DC

## 2018-06-08 MED ORDER — CYCLOPENTOLATE HCL 2 % OP SOLN
1.0000 [drp] | OPHTHALMIC | Status: AC
  Administered 2018-06-08 (×4): 1 [drp] via OPHTHALMIC

## 2018-06-08 MED ORDER — NA CHONDROIT SULF-NA HYALURON 40-17 MG/ML IO SOLN
INTRAOCULAR | Status: AC
  Filled 2018-06-08: qty 1

## 2018-06-08 MED ORDER — MOXIFLOXACIN HCL 0.5 % OP SOLN
OPHTHALMIC | Status: DC | PRN
  Administered 2018-06-08: 0.2 mL via OPHTHALMIC

## 2018-06-08 MED ORDER — TETRACAINE HCL 0.5 % OP SOLN
1.0000 [drp] | Freq: Once | OPHTHALMIC | Status: AC
  Administered 2018-06-08: 1 [drp] via OPHTHALMIC

## 2018-06-08 MED ORDER — PHENYLEPHRINE HCL 10 % OP SOLN
OPHTHALMIC | Status: AC
  Administered 2018-06-08: 1 [drp] via OPHTHALMIC
  Filled 2018-06-08: qty 5

## 2018-06-08 MED ORDER — EPINEPHRINE PF 1 MG/ML IJ SOLN
INTRAMUSCULAR | Status: AC
  Filled 2018-06-08: qty 2

## 2018-06-08 MED ORDER — MIDAZOLAM HCL 5 MG/5ML IJ SOLN
INTRAMUSCULAR | Status: DC | PRN
  Administered 2018-06-08: 2 mg via INTRAVENOUS

## 2018-06-08 MED ORDER — LIDOCAINE HCL (PF) 4 % IJ SOLN
INTRAMUSCULAR | Status: AC
  Filled 2018-06-08: qty 5

## 2018-06-08 MED ORDER — CARBACHOL 0.01 % IO SOLN
INTRAOCULAR | Status: DC | PRN
  Administered 2018-06-08: 0.5 mL via INTRAOCULAR

## 2018-06-08 MED ORDER — TETRACAINE HCL 0.5 % OP SOLN
OPHTHALMIC | Status: AC
  Administered 2018-06-08: 1 [drp] via OPHTHALMIC
  Filled 2018-06-08: qty 4

## 2018-06-08 MED ORDER — POVIDONE-IODINE 5 % OP SOLN
OPHTHALMIC | Status: AC
  Filled 2018-06-08: qty 30

## 2018-06-08 MED ORDER — LIDOCAINE HCL (PF) 4 % IJ SOLN
INTRAOCULAR | Status: DC | PRN
  Administered 2018-06-08: 4 mL via OPHTHALMIC

## 2018-06-08 MED ORDER — PHENYLEPHRINE HCL 10 % OP SOLN
1.0000 [drp] | OPHTHALMIC | Status: AC
  Administered 2018-06-08 (×4): 1 [drp] via OPHTHALMIC

## 2018-06-08 MED ORDER — POVIDONE-IODINE 5 % OP SOLN
OPHTHALMIC | Status: DC | PRN
  Administered 2018-06-08: 1 via OPHTHALMIC

## 2018-06-08 MED ORDER — MOXIFLOXACIN HCL 0.5 % OP SOLN: 1.0000 [drp] | OPHTHALMIC | Status: DC | PRN

## 2018-06-08 SURGICAL SUPPLY — 17 items
GLOVE BIO SURGEON STRL SZ8 (GLOVE) ×2 IMPLANT
GLOVE BIOGEL M 6.5 STRL (GLOVE) ×2 IMPLANT
GLOVE SURG LX 8.0 MICRO (GLOVE) ×1
GLOVE SURG LX STRL 8.0 MICRO (GLOVE) ×1 IMPLANT
GOWN STRL REUS W/ TWL LRG LVL3 (GOWN DISPOSABLE) ×2 IMPLANT
GOWN STRL REUS W/TWL LRG LVL3 (GOWN DISPOSABLE) ×2
LABEL CATARACT MEDS ST (LABEL) ×2 IMPLANT
LENS IOL ACRSF IQ ULTRA 19.0 (Intraocular Lens) IMPLANT
LENS IOL ACRYSOF IQ 19.0 (Intraocular Lens) ×2 IMPLANT
PACK CATARACT (MISCELLANEOUS) ×2 IMPLANT
PACK CATARACT BRASINGTON LX (MISCELLANEOUS) ×2 IMPLANT
PACK EYE AFTER SURG (MISCELLANEOUS) ×2 IMPLANT
SOL BSS BAG (MISCELLANEOUS) ×2
SOLUTION BSS BAG (MISCELLANEOUS) ×1 IMPLANT
SYR 5ML LL (SYRINGE) ×2 IMPLANT
WATER STERILE IRR 250ML POUR (IV SOLUTION) ×2 IMPLANT
WIPE NON LINTING 3.25X3.25 (MISCELLANEOUS) ×2 IMPLANT

## 2018-06-08 NOTE — Transfer of Care (Signed)
Immediate Anesthesia Transfer of Care Note  Patient: Kristine Alexander  Procedure(s) Performed: CATARACT EXTRACTION PHACO AND INTRAOCULAR LENS PLACEMENT (IOC) (Left Eye)  Patient Location: PACU  Anesthesia Type:MAC  Level of Consciousness: sedated  Airway & Oxygen Therapy: Patient Spontanous Breathing  Post-op Assessment: Report given to RN and Post -op Vital signs reviewed and stable  Post vital signs: Reviewed and stable  Last Vitals:  Vitals Value Taken Time  BP    Temp    Pulse    Resp    SpO2      Last Pain:  Vitals:   06/08/18 1029  TempSrc:   PainSc: 0-No pain         Complications: No apparent anesthesia complications

## 2018-06-08 NOTE — H&P (Signed)
All labs reviewed. Abnormal studies sent to patients PCP when indicated.  Previous H&P reviewed, patient examined, there are NO CHANGES.  Kristine Bennison Porfilio7/23/201911:10 AM

## 2018-06-08 NOTE — Anesthesia Preprocedure Evaluation (Signed)
Anesthesia Evaluation  Patient identified by MRN, date of birth, ID band Patient awake    Reviewed: Allergy & Precautions, NPO status , Patient's Chart, lab work & pertinent test results  History of Anesthesia Complications Negative for: history of anesthetic complications  Airway Mallampati: III  TM Distance: >3 FB Neck ROM: Full    Dental no notable dental hx.    Pulmonary neg pulmonary ROS, neg sleep apnea, neg COPD,    breath sounds clear to auscultation- rhonchi (-) wheezing      Cardiovascular Exercise Tolerance: Good (-) hypertension(-) CAD, (-) Past MI, (-) Cardiac Stents and (-) CABG  Rhythm:Regular Rate:Normal - Systolic murmurs and - Diastolic murmurs    Neuro/Psych negative neurological ROS  negative psych ROS   GI/Hepatic negative GI ROS, Neg liver ROS,   Endo/Other  diabetes, Oral Hypoglycemic Agents  Renal/GU negative Renal ROS     Musculoskeletal  (+) Arthritis ,   Abdominal (+) + obese,   Peds  Hematology  (+) anemia ,   Anesthesia Other Findings Past Medical History: No date: Diabetes mellitus without complication (HCC) No date: Hyperlipidemia No date: Iron deficiency anemia No date: Osteoarthritis of multiple joints 08/20/2017: Prediabetes   Reproductive/Obstetrics                             Anesthesia Physical Anesthesia Plan  ASA: II  Anesthesia Plan: MAC   Post-op Pain Management:    Induction: Intravenous  PONV Risk Score and Plan: 2 and Midazolam  Airway Management Planned: Natural Airway  Additional Equipment:   Intra-op Plan:   Post-operative Plan:   Informed Consent: I have reviewed the patients History and Physical, chart, labs and discussed the procedure including the risks, benefits and alternatives for the proposed anesthesia with the patient or authorized representative who has indicated his/her understanding and acceptance.     Plan  Discussed with: CRNA and Anesthesiologist  Anesthesia Plan Comments:         Anesthesia Quick Evaluation

## 2018-06-08 NOTE — Anesthesia Post-op Follow-up Note (Signed)
Anesthesia QCDR form completed.        

## 2018-06-08 NOTE — Op Note (Signed)
PREOPERATIVE DIAGNOSIS:  Nuclear sclerotic cataract of the left eye.   POSTOPERATIVE DIAGNOSIS:  Nuclear sclerotic cataract of the left eye.   OPERATIVE PROCEDURE: Procedure(s): CATARACT EXTRACTION PHACO AND INTRAOCULAR LENS PLACEMENT (IOC)   SURGEON:  Galen ManilaWilliam Johnatan Baskette, MD.   ANESTHESIA:  Anesthesiologist: Alver FisherPenwarden, Amy, MD CRNA: Almeta MonasFletcher, Patricia, CRNA  1.      Managed anesthesia care. 2.     0.321ml of Shugarcaine was instilled following the paracentesis   COMPLICATIONS:  None.   TECHNIQUE:   Stop and chop   DESCRIPTION OF PROCEDURE:  The patient was examined and consented in the preoperative holding area where the aforementioned topical anesthesia was applied to the left eye and then brought back to the Operating Room where the left eye was prepped and draped in the usual sterile ophthalmic fashion and a lid speculum was placed. A paracentesis was created with the side port blade and the anterior chamber was filled with viscoelastic. A near clear corneal incision was performed with the steel keratome. A continuous curvilinear capsulorrhexis was performed with a cystotome followed by the capsulorrhexis forceps. Hydrodissection and hydrodelineation were carried out with BSS on a blunt cannula. The lens was removed in a stop and chop  technique and the remaining cortical material was removed with the irrigation-aspiration handpiece. The capsular bag was inflated with viscoelastic and the Technis [id number] lens was placed in the capsular bag without complication. The remaining viscoelastic was removed from the eye with the irrigation-aspiration handpiece. The wounds were hydrated. The anterior chamber was flushed with Miostat and the eye was inflated to physiologic pressure. 0.321ml Vigamox was placed in the anterior chamber. The wounds were found to be water tight. The eye was dressed with Vigamox. The patient was given protective glasses to wear throughout the day and a shield with which to sleep  tonight. The patient was also given drops with which to begin a drop regimen today and will follow-up with me in one day. Implant Name Type Inv. Item Serial No. Manufacturer Lot No. LRB No. Used  LENS IOL ACRYSOF IQ 19.0 - [id number] Intraocular Lens LENS IOL ACRYSOF IQ 19.0 [id number] AL[employer]  Left 1    Procedure(s) with comments: CATARACT EXTRACTION PHACO AND INTRAOCULAR LENS PLACEMENT (IOC) (Left) - US 00:28.4 AP% 13.0 CDE 3.68 Fluid pack Lot # [id number]  Electronically signed: Galen ManilaWilliam Jaelie Aguilera 06/08/2018 11:37 AM

## 2018-06-08 NOTE — Discharge Instructions (Signed)
Eye Surgery Discharge Instructions    Expect mild scratchy sensation or mild soreness. DO NOT RUB YOUR EYE!  The day of surgery:  Minimal physical activity, but bed rest is not required  No reading, computer work, or close hand work  No bending, lifting, or straining.  May watch TV  For 24 hours:  No driving, legal decisions, or alcoholic beverages  Safety precautions  Eat anything you prefer: It is better to start with liquids, then soup then solid foods.  _____ Eye patch should be worn until postoperative exam tomorrow.  ____ Solar shield eyeglasses should be worn for comfort in the sunlight/patch while sleeping  Resume all regular medications including aspirin or Coumadin if these were discontinued prior to surgery. You may shower, bathe, shave, or wash your hair. Tylenol may be taken for mild discomfort.  Call your doctor if you experience significant pain, nausea, or vomiting, fever > 101 or other signs of infection. [phone] or [phone] Specific instructions:  Follow-up Information    Galen ManilaPorfilio, William, MD Follow up on 06/09/2018.   Specialty:  Ophthalmology Why:  appointment time 10:40 AM Contact information: [address] [place] [address] [phone]

## 2018-06-08 NOTE — Anesthesia Postprocedure Evaluation (Signed)
Anesthesia Post Note  Patient: Kristine Alexander  Procedure(s) Performed: CATARACT EXTRACTION PHACO AND INTRAOCULAR LENS PLACEMENT (IOC) (Left Eye)  Patient location during evaluation: PACU Anesthesia Type: MAC Level of consciousness: awake and alert Pain management: pain level controlled Vital Signs Assessment: post-procedure vital signs reviewed and stable Respiratory status: spontaneous breathing, nonlabored ventilation, respiratory function stable and patient connected to nasal cannula oxygen Cardiovascular status: stable and blood pressure returned to baseline Postop Assessment: no apparent nausea or vomiting Anesthetic complications: no     Last Vitals:  Vitals:   06/08/18 1029 06/08/18 1140  BP: (!) 157/102 124/68  Pulse: 64 61  Resp: 17 18  Temp:  36.6 C  SpO2: 99% 100%    Last Pain:  Vitals:   06/08/18 1140  TempSrc: Temporal  PainSc: 0-No pain                 Mairely Foxworth

## 2018-08-20 ENCOUNTER — Ambulatory Visit: Payer: BLUE CROSS/BLUE SHIELD | Admitting: Family Medicine

## 2018-08-20 ENCOUNTER — Encounter: Payer: Self-pay | Admitting: Family Medicine

## 2018-08-20 VITALS — BP 124/78 | HR 96 | Temp 98.6°F | Ht 60.0 in | Wt 166.3 lb

## 2018-08-20 DIAGNOSIS — D509 Iron deficiency anemia, unspecified: Secondary | ICD-10-CM

## 2018-08-20 DIAGNOSIS — Z5181 Encounter for therapeutic drug level monitoring: Secondary | ICD-10-CM | POA: Diagnosis not present

## 2018-08-20 DIAGNOSIS — R7303 Prediabetes: Secondary | ICD-10-CM

## 2018-08-20 DIAGNOSIS — Z23 Encounter for immunization: Secondary | ICD-10-CM | POA: Diagnosis not present

## 2018-08-20 DIAGNOSIS — E669 Obesity, unspecified: Secondary | ICD-10-CM | POA: Diagnosis not present

## 2018-08-20 DIAGNOSIS — E785 Hyperlipidemia, unspecified: Secondary | ICD-10-CM | POA: Diagnosis not present

## 2018-08-20 DIAGNOSIS — E66811 Obesity, class 1: Secondary | ICD-10-CM

## 2018-08-20 NOTE — Progress Notes (Signed)
BP 124/78   Pulse 96   Temp 98.6 F (37 C)   Ht 5' (1.524 m)   Wt 166 lb 4.8 oz (75.4 kg)   BMI 32.48 kg/m    Subjective:    Patient ID: Kristine Alexander, female    DOB: Mar 20, 1956, 62 y.o.   MRN: [id number]  HPI: Kristine Alexander is a 62 y.o. female  Chief Complaint  Patient presents with  . Follow-up    HPI Patient is here with a [place]-speaking interpreter as well as her daughter She is feeling well today Flu shot today  Currently taking atorvastatin for her high cholesterol; chronic issue; no problems with the medicine Regular food like pita bread, rice, no cheese, yogurt made at home Tea, no milk No eggs  Prediabetes; no dry mouth; no urinary frequency; no blurred vision; no family hx of diabetes Diet today: noodles, with sauce, typical [place] diet she states  Obesity; she believes her body type from her heritage plays a part  She has chronically small pale RBCs, her whole life she reported earlier  Depression screen [facility] 2/9 08/20/2018 02/18/2018 02/01/2018 09/22/2017 08/18/2017  Decreased Interest 0 0 0 0 0  Down, Depressed, Hopeless 0 0 0 0 0  PHQ - 2 Score 0 0 0 0 0  Altered sleeping 0 - - - -  Tired, decreased energy 0 - - - -  Change in appetite 0 - - - -  Feeling bad or failure about yourself  0 - - - -  Trouble concentrating 0 - - - -  Moving slowly or fidgety/restless 0 - - - -  Suicidal thoughts 0 - - - -  PHQ-9 Score 0 - - - -   Fall Risk  08/20/2018 02/18/2018 02/01/2018 09/22/2017 08/18/2017  Falls in the past year? No No No No No  Number falls in past yr: - - - - -  Injury with Fall? - - - - -  Follow up - - - - -    Relevant past medical, surgical, family and social history reviewed Past Medical History:  Diagnosis Date  . Diabetes mellitus without complication (HCC)   . Hyperlipidemia   . Iron deficiency anemia   . Osteoarthritis of multiple joints   . Prediabetes 08/20/2017   Past Surgical History:  Procedure Laterality Date  . CATARACT  EXTRACTION W/PHACO Right 02/25/2016   Procedure: CATARACT EXTRACTION PHACO AND INTRAOCULAR LENS PLACEMENT (IOC);  Surgeon: Sallee Lange, MD;  Location: [facility] ORS;  Service: Ophthalmology;  Laterality: Right;  [place]    00:48.2 AP%   23.5 CDE   22.58 fluid pack lot # [id number]  . CATARACT EXTRACTION W/PHACO Left 06/08/2018   Procedure: CATARACT EXTRACTION PHACO AND INTRAOCULAR LENS PLACEMENT (IOC);  Surgeon: Galen Manila, MD;  Location: [facility] ORS;  Service: Ophthalmology;  Laterality: Left;  [place] 00:28.4 AP% 13.0 CDE 3.68 Fluid pack Lot # [id number]  . TUBAL LIGATION     Family History  Problem Relation Age of Onset  . Hyperlipidemia Mother   . Hyperlipidemia Father   . Hyperlipidemia Sister   . Diabetes Brother   . Hyperlipidemia Brother    Social History   Tobacco Use  . Smoking status: Never Smoker  . Smokeless tobacco: Never Used  Substance Use Topics  . Alcohol use: No    Alcohol/week: 0.0 standard drinks  . Drug use: No     Office Visit from 08/20/2018 in [facility]  AUDIT-C Score  0  Interim medical history since last visit reviewed. Allergies and medications reviewed  Review of Systems Per HPI unless specifically indicated above     Objective:    BP 124/78   Pulse 96   Temp 98.6 F (37 C)   Ht 5' (1.524 m)   Wt 166 lb 4.8 oz (75.4 kg)   BMI 32.48 kg/m   Wt Readings from Last 3 Encounters:  08/20/18 166 lb 4.8 oz (75.4 kg)  06/08/18 167 lb (75.8 kg)  02/18/18 165 lb 6.4 oz (75 kg)    Physical Exam  Constitutional: She appears well-developed and well-nourished. No distress.  HENT:  Head: Normocephalic and atraumatic.  Eyes: EOM are normal. No scleral icterus.  Neck: No thyromegaly present.  Cardiovascular: Normal rate, regular rhythm and normal heart sounds.  No murmur heard. Pulmonary/Chest: Effort normal and breath sounds normal. No respiratory distress. She has no wheezes.  Abdominal: Soft. Bowel sounds are normal. She  exhibits no distension.  Musculoskeletal: She exhibits no edema.  Neurological: She is alert.  Skin: Skin is warm and dry. She is not diaphoretic. No pallor.  Psychiatric: She has a normal mood and affect. Her behavior is normal. Judgment and thought content normal.      Assessment & Plan:   Problem List Items Addressed This Visit      Other   Prediabetes    Check A1c today      Relevant Orders   Hemoglobin A1c (Completed)   Obesity, Class I, BMI 30-34.9    Lose five pounds before next visit      Microcytic anemia    Check CBC      Relevant Orders   CBC with Differential/Platelet (Completed)   Medication monitoring encounter    Check liver and kidneys      Relevant Orders   COMPLETE METABOLIC PANEL WITH GFR (Completed)   Hyperlipidemia LDL goal <100 - Primary    Check lipids today; not fasting; continue statin      Relevant Orders   Lipid panel (Completed)    Other Visit Diagnoses    Need for influenza vaccination       Relevant Orders   Flu Vaccine QUAD 6+ mos PF IM (Fluarix Quad PF) (Completed)       Follow up plan: Return in about 6 months (around 02/19/2019) for follow-up visit with Dr. Sherie Don.  An after-visit summary was printed and given to the patient at check-out.  Please see the patient instructions which may contain other information and recommendations beyond what is mentioned above in the assessment and plan.  No orders of the defined types were placed in this encounter.   Orders Placed This Encounter  Procedures  . Flu Vaccine QUAD 6+ mos PF IM (Fluarix Quad PF)  . CBC with Differential/Platelet  . COMPLETE METABOLIC PANEL WITH GFR  . Hemoglobin A1c  . Lipid panel

## 2018-08-20 NOTE — Assessment & Plan Note (Signed)
Check lipids today; not fasting; continue statin

## 2018-08-20 NOTE — Assessment & Plan Note (Signed)
Check CBC 

## 2018-08-20 NOTE — Assessment & Plan Note (Signed)
Check A1c today.

## 2018-08-20 NOTE — Assessment & Plan Note (Signed)
Lose five pounds before next visit

## 2018-08-20 NOTE — Assessment & Plan Note (Signed)
Check liver and kidneys 

## 2018-08-21 LAB — COMPLETE METABOLIC PANEL WITH GFR
AG Ratio: 1.3 (calc) (ref 1.0–2.5)
ALBUMIN MSPROF: 4.2 g/dL (ref 3.6–5.1)
ALT: 18 U/L (ref 6–29)
AST: 22 U/L (ref 10–35)
Alkaline phosphatase (APISO): 58 U/L (ref 33–130)
BUN: 12 mg/dL (ref 7–25)
CALCIUM: 9.4 mg/dL (ref 8.6–10.4)
CO2: 26 mmol/L (ref 20–32)
Chloride: 105 mmol/L (ref 98–110)
Creat: 0.68 mg/dL (ref 0.50–0.99)
GFR, EST AFRICAN AMERICAN: 109 mL/min/{1.73_m2} (ref >=60)
GFR, EST NON AFRICAN AMERICAN: 94 mL/min/{1.73_m2} (ref >=60)
GLUCOSE: 90 mg/dL (ref 65–139)
Globulin: 3.3 g/dL (calc) (ref 1.9–3.7)
POTASSIUM: 3.9 mmol/L (ref 3.5–5.3)
Sodium: 140 mmol/L (ref 135–146)
TOTAL PROTEIN: 7.5 g/dL (ref 6.1–8.1)
Total Bilirubin: 0.3 mg/dL (ref 0.2–1.2)

## 2018-08-21 LAB — CBC WITH DIFFERENTIAL/PLATELET
BASOS PCT: 0.5 %
Basophils Absolute: 44 cells/uL (ref 0–200)
EOS PCT: 2.4 %
Eosinophils Absolute: 209 cells/uL (ref 15–500)
HCT: 35.5 % (ref 35.0–45.0)
HEMOGLOBIN: 11.7 g/dL (ref 11.7–15.5)
Lymphs Abs: 3115 cells/uL (ref 850–3900)
MCH: 25.5 pg — ABNORMAL LOW (ref 27.0–33.0)
MCHC: 33 g/dL (ref 32.0–36.0)
MCV: 77.5 fL — ABNORMAL LOW (ref 80.0–100.0)
MONOS PCT: 7.1 %
MPV: 9.7 fL (ref 7.5–12.5)
NEUTROS ABS: 4715 {cells}/uL (ref 1500–7800)
Neutrophils Relative %: 54.2 %
Platelets: 286 10*3/uL (ref 140–400)
RBC: 4.58 10*6/uL (ref 3.80–5.10)
RDW: 14.1 % (ref 11.0–15.0)
Total Lymphocyte: 35.8 %
WBC mixed population: 618 cells/uL (ref 200–950)
WBC: 8.7 10*3/uL (ref 3.8–10.8)

## 2018-08-21 LAB — HEMOGLOBIN A1C
HEMOGLOBIN A1C: 5.9 %{Hb} — AB (ref <5.7)
MEAN PLASMA GLUCOSE: 123 (calc)
eAG (mmol/L): 6.8 (calc)

## 2018-08-21 LAB — LIPID PANEL
Cholesterol: 150 mg/dL (ref <200)
HDL: 46 mg/dL — ABNORMAL LOW (ref >50)
LDL CHOLESTEROL (CALC): 86 mg/dL
NON-HDL CHOLESTEROL (CALC): 104 mg/dL (ref <130)
TRIGLYCERIDES: 87 mg/dL (ref <150)
Total CHOL/HDL Ratio: 3.3 (calc) (ref <5.0)

## 2018-10-20 ENCOUNTER — Other Ambulatory Visit: Payer: Self-pay | Admitting: Family Medicine

## 2018-10-20 NOTE — Telephone Encounter (Signed)
Lab Results  Component Value Date   CREATININE 0.68 08/20/2018   Lab Results  Component Value Date   ALT 18 08/20/2018

## 2019-01-10 ENCOUNTER — Emergency Department
Admission: EM | Admit: 2019-01-10 | Discharge: 2019-01-10 | Disposition: A | Discharge status: Home / Self Care | Payer: BLUE CROSS/BLUE SHIELD | Attending: Emergency Medicine | Admitting: Emergency Medicine

## 2019-01-10 ENCOUNTER — Other Ambulatory Visit: Payer: Self-pay

## 2019-01-10 ENCOUNTER — Encounter: Payer: Self-pay | Admitting: Emergency Medicine

## 2019-01-10 DIAGNOSIS — Z7984 Long term (current) use of oral hypoglycemic drugs: Secondary | ICD-10-CM | POA: Insufficient documentation

## 2019-01-10 DIAGNOSIS — E119 Type 2 diabetes mellitus without complications: Secondary | ICD-10-CM | POA: Insufficient documentation

## 2019-01-10 DIAGNOSIS — Z79899 Other long term (current) drug therapy: Secondary | ICD-10-CM | POA: Insufficient documentation

## 2019-01-10 DIAGNOSIS — N3001 Acute cystitis with hematuria: Secondary | ICD-10-CM | POA: Diagnosis not present

## 2019-01-10 DIAGNOSIS — R1030 Lower abdominal pain, unspecified: Secondary | ICD-10-CM | POA: Diagnosis present

## 2019-01-10 LAB — URINALYSIS, COMPLETE (UACMP) WITH MICROSCOPIC
BILIRUBIN URINE: NEGATIVE
GLUCOSE, UA: NEGATIVE mg/dL
Ketones, ur: NEGATIVE mg/dL
NITRITE: NEGATIVE
PH: 7 (ref 5.0–8.0)
Protein, ur: 30 mg/dL — AB
Specific Gravity, Urine: 1.005 (ref 1.005–1.030)
WBC, UA: >50 WBC/hpf — ABNORMAL HIGH (ref 0–5)

## 2019-01-10 MED ORDER — PHENAZOPYRIDINE HCL 100 MG PO TABS: 100.0000 mg | ORAL_TABLET | Freq: Three times a day (TID) | ORAL | 0 refills | Status: DC | PRN

## 2019-01-10 MED ORDER — PHENAZOPYRIDINE HCL 200 MG PO TABS
200.0000 mg | ORAL_TABLET | Freq: Once | ORAL | Status: AC
  Administered 2019-01-10: 200 mg via ORAL
  Filled 2019-01-10: qty 1

## 2019-01-10 MED ORDER — SULFAMETHOXAZOLE-TRIMETHOPRIM 800-160 MG PO TABS
1.0000 | ORAL_TABLET | Freq: Once | ORAL | Status: AC
  Administered 2019-01-10: 1 via ORAL
  Filled 2019-01-10: qty 1

## 2019-01-10 MED ORDER — SULFAMETHOXAZOLE-TRIMETHOPRIM 800-160 MG PO TABS: 1.0000 | ORAL_TABLET | Freq: Two times a day (BID) | ORAL | 0 refills | Status: DC

## 2019-01-10 NOTE — ED Notes (Signed)
See triage note   Presents with some lower abd discomfort and dysuria  Pain started yesterday but eased off and started back again early this am

## 2019-01-10 NOTE — ED Triage Notes (Addendum)
Pt says she started yesterday with low midline abd pain and urinary frequency; denies dysuria; denies fever at home

## 2019-01-10 NOTE — ED Provider Notes (Signed)
[facility] Emergency Department Provider Note   ____________________________________________   None    (approximate)  I have reviewed the triage vital signs and the nursing notes.   HISTORY  Chief Complaint Abdominal Pain and Urinary Frequency Patient and daughter.  HPI Kristine Alexander is a 63 y.o. female presents to the ED with complaint of lower abdominal pain discomfort and dysuria that initially began yesterday and then got slightly better but then intensified at 4 AM this morning.  Patient has frequency and now beginning to see some blood in her urine.   She denies any previous UTIs.  She denies any fever, chills, nausea or vomiting.  She denies any back pain.  She rates her discomfort as an 8 out of 10.   Past Medical History:  Diagnosis Date  . Diabetes mellitus without complication (HCC)   . Hyperlipidemia   . Iron deficiency anemia   . Osteoarthritis of multiple joints   . Prediabetes 08/20/2017    Patient Active Problem List   Diagnosis Date Noted  . Allergic conjunctivitis of both eyes 02/18/2018  . Medication monitoring encounter 12/22/2017  . Prediabetes 08/20/2017  . Microcytic anemia 08/20/2017  . Vitamin B12 deficiency 08/20/2017  . Numbness 08/18/2017  . Microcytosis 03/01/2017  . SOB (shortness of breath) on exertion 10/03/2015  . Pedal edema 10/02/2015  . Heart murmur 09/18/2015  . Obesity, Class I, BMI 30-34.9 05/16/2015  . Osteoarthrosis, generalized, multiple joints 05/16/2015  . History of fall 05/16/2015  . Hyperlipidemia LDL goal <100 05/16/2015    Past Surgical History:  Procedure Laterality Date  . CATARACT EXTRACTION W/PHACO Right 02/25/2016   Procedure: CATARACT EXTRACTION PHACO AND INTRAOCULAR LENS PLACEMENT (IOC);  Surgeon: Sallee Lange, MD;  Location: [facility] ORS;  Service: Ophthalmology;  Laterality: Right;  [place]    00:48.2 AP%   23.5 CDE   22.58 fluid pack lot # [id number]  . CATARACT EXTRACTION W/PHACO  Left 06/08/2018   Procedure: CATARACT EXTRACTION PHACO AND INTRAOCULAR LENS PLACEMENT (IOC);  Surgeon: Galen Manila, MD;  Location: [facility] ORS;  Service: Ophthalmology;  Laterality: Left;  [place] 00:28.4 AP% 13.0 CDE 3.68 Fluid pack Lot # [id number]  . TUBAL LIGATION      Prior to Admission medications   Medication Sig Start Date End Date Taking? Authorizing Provider  atorvastatin (LIPITOR) 10 MG tablet TAKE 1 TABLET(10 MG) BY MOUTH AT BEDTIME 02/01/18   Lada, Janit Bern, MD  metFORMIN (GLUCOPHAGE-XR) 500 MG 24 hr tablet TAKE 2 TABLETS(1000 MG) BY MOUTH DAILY 10/20/18   Lada, Janit Bern, MD  phenazopyridine (PYRIDIUM) 100 MG tablet Take 1 tablet (100 mg total) by mouth 3 (three) times daily as needed for pain. 01/10/19 01/10/20  Tommi Rumps, PA-C  sulfamethoxazole-trimethoprim (BACTRIM DS,SEPTRA DS) 800-160 MG tablet Take 1 tablet by mouth 2 (two) times daily. 01/10/19   Tommi Rumps, PA-C    Allergies Patient has no known allergies.  Family History  Problem Relation Age of Onset  . Hyperlipidemia Mother   . Hyperlipidemia Father   . Hyperlipidemia Sister   . Diabetes Brother   . Hyperlipidemia Brother     Social History Social History   Tobacco Use  . Smoking status: Never Smoker  . Smokeless tobacco: Never Used  Substance Use Topics  . Alcohol use: No    Alcohol/week: 0.0 standard drinks  . Drug use: No    Review of Systems Constitutional: No fever/chills Eyes: No visual changes. Cardiovascular: Denies chest pain. Respiratory: Denies shortness of  breath. Gastrointestinal: No abdominal pain.  No nausea, no vomiting. Genitourinary: Positive for dysuria. Musculoskeletal: Negative for back pain. Skin: Negative for rash. Neurological: Negative for headaches, focal weakness or numbness. ___________________________________________   PHYSICAL EXAM:  VITAL SIGNS: ED Triage Vitals  Enc Vitals Group     BP 01/10/19 0549 (!) 150/74     Pulse Rate 01/10/19 0549 77      Resp 01/10/19 0549 17     Temp 01/10/19 0549 97.6 F (36.4 C)     Temp Source 01/10/19 0549 Oral     SpO2 01/10/19 0549 97 %     Weight --      Height 01/10/19 0552 5' (1.524 m)     Head Circumference --      Peak Flow --      Pain Score 01/10/19 0551 8     Pain Loc --      Pain Edu? --      Excl. in GC? --    Constitutional: Alert and oriented. Well appearing and in no acute distress. Eyes: Conjunctivae are normal.  Head: Atraumatic. Neck: No stridor.   Cardiovascular: Normal rate, regular rhythm. Grossly normal heart sounds.  Good peripheral circulation. Respiratory: Normal respiratory effort.  No retractions. Lungs CTAB. Gastrointestinal: Soft and nontender. No distention.  No CVA tenderness present.  Minimal suprapubic tenderness to palpation. Musculoskeletal: Moves upper and lower extremities without any difficulty. Neurologic:  Normal speech and language. No gross focal neurologic deficits are appreciated. No gait instability. Skin:  Skin is warm, dry and intact. No rash noted. Psychiatric: Mood and affect are normal. Speech and behavior are normal.  ____________________________________________   LABS (all labs ordered are listed, but only abnormal results are displayed)  Labs Reviewed  URINALYSIS, COMPLETE (UACMP) WITH MICROSCOPIC - Abnormal; Notable for the following components:      Result Value   Color, Urine YELLOW (*)    APPearance HAZY (*)    Hgb urine dipstick LARGE (*)    Protein, ur 30 (*)    Leukocytes,Ua LARGE (*)    WBC, UA >50 (*)    Bacteria, UA RARE (*)    All other components within normal limits  URINE CULTURE    PROCEDURES  Procedure(s) performed (including Critical Care):  Procedures   ____________________________________________   INITIAL IMPRESSION / ASSESSMENT AND PLAN / ED COURSE  As part of my medical decision making, I reviewed the following data within the electronic MEDICAL RECORD NUMBER Notes from prior ED visits and [place] Controlled  Substance Database  Patient presents to the ED with dysuria that started last evening and worsened at 4 AM today.  She states that she has not had a urinary tract infection in the past.  Patient was given Pyridium for symptoms and Bactrim DS twice daily for 10 days.  Urinary culture was ordered.  Patient was given medication prior to discharge as it was still approximately 2 hours before pharmacies were open.  She will follow-up with her PCP if any continued problems.  She was encouraged to drink fluids.  ____________________________________________   FINAL CLINICAL IMPRESSION(S) / ED DIAGNOSES  Final diagnoses:  Acute cystitis with hematuria     ED Discharge Orders         Ordered    sulfamethoxazole-trimethoprim (BACTRIM DS,SEPTRA DS) 800-160 MG tablet  2 times daily     01/10/19 0729    phenazopyridine (PYRIDIUM) 100 MG tablet  3 times daily PRN     01/10/19 0729  Note:  This document was prepared using Dragon voice recognition software and may include unintentional dictation errors.    Tommi Rumps, PA-C 01/10/19 1613    Emily Filbert, MD 01/11/19 [phone]

## 2019-01-10 NOTE — Discharge Instructions (Signed)
Follow-up with your primary care provider after finishing the antibiotic to have your urine rechecked.  Also your provider can check the hospital labs to see the results of your urine culture.  Begin taking Bactrim DS twice daily for 10 days.  The Pyridium is 3 times a day as needed for discomfort and frequency.  The medication will cause your urine to become a bright yellow/orange.  Increase fluids.  Return to the emergency department if any severe worsening of your symptoms such as high fever, nausea, back pain.

## 2019-01-11 LAB — URINE CULTURE
Culture: NO GROWTH
Special Requests: NORMAL

## 2019-01-20 ENCOUNTER — Telehealth: Payer: Self-pay

## 2019-01-20 ENCOUNTER — Ambulatory Visit (INDEPENDENT_AMBULATORY_CARE_PROVIDER_SITE_OTHER): Payer: BLUE CROSS/BLUE SHIELD | Admitting: Family Medicine

## 2019-01-20 ENCOUNTER — Encounter: Payer: Self-pay | Admitting: Family Medicine

## 2019-01-20 ENCOUNTER — Ambulatory Visit
Admission: RE | Admit: 2019-01-20 | Discharge: 2019-01-20 | Disposition: A | Discharge status: Home / Self Care | Payer: BLUE CROSS/BLUE SHIELD | Source: Ambulatory Visit | Attending: Family Medicine | Admitting: Family Medicine

## 2019-01-20 VITALS — BP 140/80 | HR 88 | Temp 98.1°F | Resp 16 | Ht 61.0 in | Wt 168.9 lb

## 2019-01-20 DIAGNOSIS — R310 Gross hematuria: Secondary | ICD-10-CM | POA: Diagnosis not present

## 2019-01-20 DIAGNOSIS — N3091 Cystitis, unspecified with hematuria: Secondary | ICD-10-CM

## 2019-01-20 DIAGNOSIS — Z23 Encounter for immunization: Secondary | ICD-10-CM

## 2019-01-20 LAB — POCT URINALYSIS DIPSTICK
Bilirubin, UA: NEGATIVE
Blood, UA: NEGATIVE
Glucose, UA: NEGATIVE
Ketones, UA: NEGATIVE
LEUKOCYTES UA: NEGATIVE
NITRITE UA: NEGATIVE
Odor: NORMAL
PH UA: 5 (ref 5.0–8.0)
Protein, UA: NEGATIVE
Spec Grav, UA: 1.01 (ref 1.010–1.025)
UROBILINOGEN UA: 0.2 U/dL

## 2019-01-20 NOTE — Telephone Encounter (Signed)
I cannot force her to go, obviously The urologist will see her and I'm sure will discuss the usual work-up for hematuria

## 2019-01-20 NOTE — Telephone Encounter (Signed)
So an ultrasound is NOT the best imaging to look for a problem for her bleeding I strongly recommend her to have the CT scan as the next step in this work-up We also need her to see the urologist

## 2019-01-20 NOTE — Telephone Encounter (Signed)
We have had a difficult time with her CT scan per CT at hospital her ins is out of network.  Per [facility] referral coordinator it was fine?  Called patient to verify and she states they refuse CT at this time.  They would like a call from Dr. Sherie Don to further assist with maybe getting ultrasound instead?

## 2019-01-20 NOTE — Progress Notes (Signed)
BP 140/80 (BP Location: Left Arm, Patient Position: Sitting, Cuff Size: Normal)   Pulse 88   Temp 98.1 F (36.7 C) (Oral)   Resp 16   Ht 5\' 1"  (1.549 m)   Wt 168 lb 14.4 oz (76.6 kg)   SpO2 97%   BMI 31.91 kg/m    Subjective:    Patient ID: Kristine Alexander, female    DOB: [birth year], 63 y.o.   MRN: [id number]  HPI: Kristine Alexander is a 63 y.o. female  Chief Complaint  Patient presents with  . Hospitalization Follow-up    HPI Patient is here for ER follow-up; family members present; [place] interpreter used through language line She was seen in the ER She went because she was having pain at 2 am, also having frequency of urination; did see some blood; no burning with urination; no thing similar in the past; last year, she had something similar and it lasted last 3-4 days; no fevers Notes reviewed She had a markedly abnormal urine in the ER, reveiwed However, the culture did not grow out any infection Treated with antibiotics, took for one week, finished yesterday; Bactrim DS, no problems Current symptoms are none Drinking plenty of water Last visible blood was maybe the day after she left the ER No studies ordered, no imaging No one in the family with kidney stones, bladder or kidney cancer She has diabetes; no problems with her feet   Depression screen [facility] 2/9 01/20/2019 08/20/2018 02/18/2018 02/01/2018 09/22/2017  Decreased Interest 0 0 0 0 0  Down, Depressed, Hopeless 0 0 0 0 0  PHQ - 2 Score 0 0 0 0 0  Altered sleeping 0 0 - - -  Tired, decreased energy 0 0 - - -  Change in appetite 0 0 - - -  Feeling bad or failure about yourself  0 0 - - -  Trouble concentrating 0 0 - - -  Moving slowly or fidgety/restless 0 0 - - -  Suicidal thoughts 0 0 - - -  PHQ-9 Score 0 0 - - -  Difficult doing work/chores Not difficult at all - - - -   Fall Risk  01/20/2019 08/20/2018 02/18/2018 02/01/2018 09/22/2017  Falls in the past year? 0 No No No No  Number falls in past yr: 0 - - - -    Injury with Fall? 0 - - - -  Follow up Falls evaluation completed - - - -    Relevant past medical, surgical, family and social history reviewed Past Medical History:  Diagnosis Date  . Diabetes mellitus without complication (HCC)   . Hyperlipidemia   . Iron deficiency anemia   . Osteoarthritis of multiple joints   . Prediabetes 08/20/2017   Past Surgical History:  Procedure Laterality Date  . CATARACT EXTRACTION W/PHACO Right 02/25/2016   Procedure: CATARACT EXTRACTION PHACO AND INTRAOCULAR LENS PLACEMENT (IOC);  Surgeon: Sallee Lange, MD;  Location: [facility] ORS;  Service: Ophthalmology;  Laterality: Right;  [place]    00:48.2 AP%   23.5 CDE   22.58 fluid pack lot # [id number]  . CATARACT EXTRACTION W/PHACO Left 06/08/2018   Procedure: CATARACT EXTRACTION PHACO AND INTRAOCULAR LENS PLACEMENT (IOC);  Surgeon: Galen Manila, MD;  Location: [facility] ORS;  Service: Ophthalmology;  Laterality: Left;  [place] 00:28.4 AP% 13.0 CDE 3.68 Fluid pack Lot # [id number]  . TUBAL LIGATION     Family History  Problem Relation Age of Onset  . Hyperlipidemia Mother   . Hyperlipidemia Father   .  Hyperlipidemia Sister   . Diabetes Brother   . Hyperlipidemia Brother    Social History   Tobacco Use  . Smoking status: Never Smoker  . Smokeless tobacco: Never Used  Substance Use Topics  . Alcohol use: No    Alcohol/week: 0.0 standard drinks  . Drug use: No     Office Visit from 01/20/2019 in [facility]  AUDIT-C Score  0      Interim medical history since last visit reviewed. Allergies and medications reviewed  Review of Systems Per HPI unless specifically indicated above     Objective:    BP 140/80 (BP Location: Left Arm, Patient Position: Sitting, Cuff Size: Normal)   Pulse 88   Temp 98.1 F (36.7 C) (Oral)   Resp 16   Ht  (1.549 m)   Wt 168 lb 14.4 oz (76.6 kg)   SpO2 97%   BMI 31.91 kg/m   Wt Readings from Last 3 Encounters:  01/20/19 168 lb 14.4 oz (76.6  kg)  08/20/18 166 lb 4.8 oz (75.4 kg)  06/08/18 167 lb (75.8 kg)    Physical Exam Constitutional:      Appearance: She is well-developed.  Eyes:     General: No scleral icterus. Cardiovascular:     Rate and Rhythm: Normal rate and regular rhythm.  Pulmonary:     Effort: Pulmonary effort is normal.     Breath sounds: Normal breath sounds.  Abdominal:     General: Bowel sounds are normal. There is no distension.     Tenderness: There is no abdominal tenderness. There is no right CVA tenderness, left CVA tenderness or guarding.  Psychiatric:        Mood and Affect: Mood is not anxious or depressed.        Behavior: Behavior normal.     Comments: Very pleasant lady       Assessment & Plan:   Problem List Items Addressed This Visit    None    Visit Diagnoses    Hemorrhagic cystitis    -  Primary   looking at urine dip and micro; however, urine culture did not show any growth; recheck urine today; order CT urogram; refer to urologist   Relevant Orders   POCT urinalysis dipstick (Completed)   Urine Culture   Urinalysis, microscopic only   Gross hematuria       work-up includes CT urogram and referral to urologist; surprisingly, her urine culture in the ER showed no growth; to ER if worse   Relevant Orders   Ambulatory referral to Urology   CBC with Differential/Platelet (Completed)   BASIC METABOLIC PANEL WITH GFR   CT HEMATURIA WORKUP   Need for Tdap vaccination       Relevant Orders   Tdap vaccine greater than or equal to 7yo IM (Completed)   POCT urinalysis dipstick (Completed)   Urine Culture   Urinalysis, microscopic only       Follow up plan: Return for cpe WITH PAP.  An after-visit summary was printed and given to the patient at check-out.  Please see the patient instructions which may contain other information and recommendations beyond what is mentioned above in the assessment and plan.  No orders of the defined types were placed in this  encounter.   Orders Placed This Encounter  Procedures  . Urine Culture  . CT HEMATURIA WORKUP  . Tdap vaccine greater than or equal to 7yo IM  . Urinalysis, microscopic only  .  CBC with Differential/Platelet  . BASIC METABOLIC PANEL WITH GFR  . Ambulatory referral to Urology  . POCT urinalysis dipstick   Extra time needed due to interpretation services Face-to-face time with patient was more than 25 minutes, >50% time spent counseling and coordination of care

## 2019-01-20 NOTE — Telephone Encounter (Signed)
Called back to explain and patient is still refusing CT scan.  Notified about referral

## 2019-01-20 NOTE — Patient Instructions (Addendum)
I'm going to get a CT scan  We'll get labs today We'll have you see a urologist If you have not heard anything from my staff in a week about any orders/referrals/studies from today, please contact us here to follow-up [phone]  See me in April for follow-up (6 months after last visit) Call or go to the ER if problems develop before then

## 2019-01-21 LAB — BASIC METABOLIC PANEL WITH GFR
BUN: 12 mg/dL (ref 7–25)
CHLORIDE: 104 mmol/L (ref 98–110)
CO2: 24 mmol/L (ref 20–32)
Calcium: 9.4 mg/dL (ref 8.6–10.4)
Creat: 0.76 mg/dL (ref 0.50–0.99)
GFR, EST AFRICAN AMERICAN: 97 mL/min/{1.73_m2} (ref >=60)
GFR, Est Non African American: 83 mL/min/{1.73_m2} (ref >=60)
Glucose, Bld: 101 mg/dL — ABNORMAL HIGH (ref 65–99)
Potassium: 4.3 mmol/L (ref 3.5–5.3)
Sodium: 137 mmol/L (ref 135–146)

## 2019-01-21 LAB — CBC WITH DIFFERENTIAL/PLATELET
ABSOLUTE MONOCYTES: 519 {cells}/uL (ref 200–950)
Basophils Absolute: 44 cells/uL (ref 0–200)
Basophils Relative: 0.5 %
Eosinophils Absolute: 114 cells/uL (ref 15–500)
Eosinophils Relative: 1.3 %
HCT: 34.8 % — ABNORMAL LOW (ref 35.0–45.0)
Hemoglobin: 11.4 g/dL — ABNORMAL LOW (ref 11.7–15.5)
Lymphs Abs: 3344 cells/uL (ref 850–3900)
MCH: 25.3 pg — AB (ref 27.0–33.0)
MCHC: 32.8 g/dL (ref 32.0–36.0)
MCV: 77.2 fL — AB (ref 80.0–100.0)
MPV: 9.9 fL (ref 7.5–12.5)
Monocytes Relative: 5.9 %
Neutro Abs: 4778 cells/uL (ref 1500–7800)
Neutrophils Relative %: 54.3 %
Platelets: 305 10*3/uL (ref 140–400)
RBC: 4.51 10*6/uL (ref 3.80–5.10)
RDW: 14.1 % (ref 11.0–15.0)
TOTAL LYMPHOCYTE: 38 %
WBC: 8.8 10*3/uL (ref 3.8–10.8)

## 2019-01-21 LAB — URINALYSIS, MICROSCOPIC ONLY
Bacteria, UA: NONE SEEN /HPF
Hyaline Cast: NONE SEEN /LPF
RBC / HPF: NONE SEEN /HPF (ref 0–2)
Squamous Epithelial / HPF: NONE SEEN /HPF (ref <=5)
WBC, UA: NONE SEEN /HPF (ref 0–5)

## 2019-01-21 LAB — URINE CULTURE
MICRO NUMBER:: 282817
Result:: NO GROWTH
SPECIMEN QUALITY:: ADEQUATE

## 2019-01-24 ENCOUNTER — Telehealth: Payer: Self-pay

## 2019-01-24 DIAGNOSIS — Z1211 Encounter for screening for malignant neoplasm of colon: Secondary | ICD-10-CM

## 2019-01-24 NOTE — Telephone Encounter (Signed)
-----   Message from Kerman Passey, MD sent at 01/21/2019 12:20 PM EST ----- Asher Muir, please let the patient know that if she refuses colonoscopy, then the other option is cologuard; this is not my preferred option for her but since she refuses colonoscopy, this is better than nothing; please ORDER if she agrees

## 2019-02-05 ENCOUNTER — Other Ambulatory Visit: Payer: Self-pay | Admitting: Family Medicine

## 2019-02-05 NOTE — Telephone Encounter (Signed)
Lab Results  Component Value Date   CHOL 150 08/20/2018   HDL 46 (L) 08/20/2018   LDLCALC 86 08/20/2018   TRIG 87 08/20/2018   CHOLHDL 3.3 08/20/2018   Lab Results  Component Value Date   ALT 18 08/20/2018

## 2019-02-17 ENCOUNTER — Telehealth: Payer: Self-pay | Admitting: Family Medicine

## 2019-02-17 NOTE — Telephone Encounter (Signed)
I believe she refused, and was just willing to do refeeral

## 2019-02-17 NOTE — Telephone Encounter (Signed)
Please call [facility] and explain situation We would like to have them see her please

## 2019-02-17 NOTE — Telephone Encounter (Signed)
Copied from CRM [id number]. Topic: Quick Communication - See Telephone Encounter >> Feb 17, 2019  2:38 PM Fanny Bien wrote: CRM for notification. See Telephone encounter for: 02/17/19. Leah calling from [facility] called and stated that they would like the patient to have CT abdomen and pelvic with and without contrast with delays. Please advise

## 2019-02-18 NOTE — Telephone Encounter (Signed)
Office is closed on Friday. Will call back Monday morning.

## 2019-02-21 ENCOUNTER — Other Ambulatory Visit: Payer: Self-pay

## 2019-02-21 ENCOUNTER — Encounter: Payer: Self-pay | Admitting: Nurse Practitioner

## 2019-02-21 ENCOUNTER — Ambulatory Visit: Payer: Self-pay | Admitting: *Deleted

## 2019-02-21 ENCOUNTER — Ambulatory Visit (INDEPENDENT_AMBULATORY_CARE_PROVIDER_SITE_OTHER): Payer: BLUE CROSS/BLUE SHIELD | Admitting: Nurse Practitioner

## 2019-02-21 VITALS — BP 122/80 | HR 66 | Temp 98.2°F | Resp 14 | Ht 61.0 in | Wt 166.9 lb

## 2019-02-21 DIAGNOSIS — R1013 Epigastric pain: Secondary | ICD-10-CM

## 2019-02-21 MED ORDER — FAMOTIDINE 20 MG PO TABS: 20.0000 mg | ORAL_TABLET | Freq: Two times a day (BID) | ORAL | 0 refills | Status: DC

## 2019-02-21 NOTE — Telephone Encounter (Signed)
Pt has appt with [facility] Tuesday, 03/01/19 with Dr. Apolinar Junes. Schedule is subject to change per scheduler.

## 2019-02-21 NOTE — Progress Notes (Signed)
Name: Kristine Alexander   MRN: [id number]    DOB: 08/16/1956   Date:02/21/2019       Progress Note  Subjective  Chief Complaint  Chief Complaint  Patient presents with  . Abdominal Pain    onset 1 day epi gastric.  Denies any nausea, vomiting, diarrhea, or urinary symptoms    HPI  Patient presents with epigastric abdominal pain, sharp, intermittent; non-radiating. This episode started today around 8 am. States had similar pain 4 days ago that self-resolved a few hours later. She had tried tums and gingerale during previous event and seemed to offer some relief.  Patient notes this has been ongoing for 2-3 months, lasts for about a few hours. No burning in chest or throat.   Denies n/v/d, fevers, dysuria, appetite changes, unexplained weight loss- states she has been trying to lose weight, night sweats, shortness of breath or chest pain.  She does a lot of heavy lifting, pain is not worse with movement. Last BM this morning, normal without straining.  Abdominal Surgeries: tubal ligation   Patient was seen in ED on 2/24 for lower abdominal pain and hematuria, treated with bactim, urine culture was negative. Was seen by PCP on 01/20/2019, ordered CT for hematuria work-up; patient refused and was additionally referred to urology. Pt [facility] app[facility] Tuesday, 03/01/19 with Dr. Apolinar JunesBrandon. States she has not had any blood in her urine.    PHQ2/9: [facility] 02/21/2019 01/20/2019 08/20/2018 02/18/2018 02/01/2018  Decreased Interest 0 0 0 0 0  Down, Depressed, Hopeless 0 0 0 0 0  PHQ - 2 Score 0 0 0 0 0  Altered sleeping 0 0 0 - -  Tired, decreased energy 0 0 0 - -  Change in appetite 0 0 0 - -  Feeling bad or failure about yourself  0 0 0 - -  Trouble concentrating 0 0 0 - -  Moving slowly or fidgety/restless 0 0 0 - -  Suicidal thoughts 0 0 0 - -  PHQ-9 Score 0 0 0 - -  Difficult doing work/chores Not difficult at all Not difficult at all - - -    PHQ reviewed.  Negative  Patient Active Problem List   Diagnosis Date Noted  . 'light-for-dates' infant with signs of fetal malnutrition 02/21/2019  . Allergic conjunctivitis of both eyes 02/18/2018  . Medication monitoring encounter 12/22/2017  . Prediabetes 08/20/2017  . Microcytic anemia 08/20/2017  . Vitamin B12 deficiency 08/20/2017  . Numbness 08/18/2017  . Microcytosis 03/01/2017  . SOB (shortness of breath) on exertion 10/03/2015  . Pedal edema 10/02/2015  . Heart murmur 09/18/2015  . Obesity, Class I, BMI 30-34.9 05/16/2015  . Osteoarthrosis, generalized, multiple joints 05/16/2015  . History of fall 05/16/2015  . Hyperlipidemia LDL goal <100 05/16/2015    Past Medical History:  Diagnosis Date  . Diabetes mellitus without complication (HCC)   . Hyperlipidemia   . Iron deficiency anemia   . Osteoarthritis of multiple joints   . Prediabetes 08/20/2017    Past Surgical History:  Procedure Laterality Date  . CATARACT EXTRACTION W/PHACO Right 02/25/2016   Procedure: CATARACT EXTRACTION PHACO AND INTRAOCULAR LENS PLACEMENT (IOC);  Surgeon: Sallee LangeSteven Dingeldein, MD;  Location: [facility] ORS;  Service: Ophthalmology;  Laterality: Right;  US    00:48.2 AP%   23.5 CDE   22.58 fluid pack lot # [id number]  . CATARACT EXTRACTION W/PHACO Left 06/08/2018   Procedure: CATARACT EXTRACTION PHACO AND INTRAOCULAR LENS PLACEMENT (IOC);  Surgeon: Galen Manila, MD;  Location: [facility] ORS;  Service: Ophthalmology;  Laterality: Left;  [place] 00:28.4 AP% 13.0 CDE 3.68 Fluid pack Lot # [id number]  . TUBAL LIGATION      Social History   Tobacco Use  . Smoking status: Never Smoker  . Smokeless tobacco: Never Used  Substance Use Topics  . Alcohol use: No    Alcohol/week: 0.0 standard drinks     Current Outpatient Medications:  .  atorvastatin (LIPITOR) 10 MG tablet, TAKE 1 TABLET(10 MG) BY MOUTH AT BEDTIME, Disp: 90 tablet, Rfl: 1 .  metFORMIN (GLUCOPHAGE-XR) 500 MG 24 hr tablet, TAKE 2 TABLETS(1000 MG) BY MOUTH  DAILY, Disp: 60 tablet, Rfl: 2  No Known Allergies  ROS    No other specific complaints in a complete review of systems (except as listed in HPI above).  Objective  Vitals:   02/21/19 1213  BP: 122/80  Pulse: 66  Resp: 14  Temp: 98.2 F (36.8 C)  TempSrc: Oral  SpO2: 98%  Weight: 166 lb 14.4 oz (75.7 kg)  Height: 5\' 1"  (1.549 m)    Body mass index is 31.54 kg/m.  Nursing Note and Vital Signs reviewed.  Physical Exam Constitutional:      General: She is not in acute distress.    Appearance: She is well-developed. She is not ill-appearing.  HENT:     Head: Normocephalic and atraumatic.     Mouth/Throat:     Mouth: Mucous membranes are moist.     Pharynx: Oropharynx is clear.  Eyes:     Conjunctiva/sclera: Conjunctivae normal.     Pupils: Pupils are equal, round, and reactive to light.  Cardiovascular:     Rate and Rhythm: Normal rate and regular rhythm.     Heart sounds: No murmur.  Pulmonary:     Effort: Pulmonary effort is normal.     Breath sounds: Normal breath sounds.  Abdominal:     General: Abdomen is flat. Bowel sounds are increased. There is no distension.     Palpations: Abdomen is soft.     Tenderness: There is abdominal tenderness in the epigastric area. There is no right CVA tenderness, left CVA tenderness or guarding. Negative signs include Murphy's sign and McBurney's sign.    Skin:    General: Skin is warm and dry.  Neurological:     General: No focal deficit present.     Mental Status: She is alert.  Psychiatric:        Mood and Affect: Mood normal.       No results found for this or any previous visit (from the past 48 hour(s)).  Assessment & Plan 1. Epigastric pain Low-fat diet, mild tenderness on exam at epigastric area, consider RUQ [place] r/o gallstones- due to pandemic and patients non-toxic appearance will order pending lab results. Trigs controlled, no etoh use- unlikely pancreatitis- did not order lipase/amylase.  - CBC  w/Diff/Platelet - COMPLETE METABOLIC PANEL WITH GFR - H. pylori breath test - famotidine (PEPCID) 20 MG tablet; Take 1 tablet (20 mg total) by mouth 2 (two) times daily.  Dispense: 60 tablet; Refill: 0  Discussed ER precautions.

## 2019-02-21 NOTE — Patient Instructions (Addendum)
If you have not heard anything from my staff in a week about any orders/referrals/studies from today, please contact us here to follow-up [phone] -Please take famotidine twice a day for the next week and then just as needed for stomach pain. Please eat low-fat diet.  - Based of lab results we will change plan as needed - If you develop severe pain, vomiting and fevers please go to the ER.  Gallbladder Eating Plan If you have a gallbladder condition, you may have trouble digesting fats. Eating a low-fat diet can help reduce your symptoms, and may be helpful before and after having surgery to remove your gallbladder (cholecystectomy). Your health care provider may recommend that you work with a diet and nutrition specialist (dietitian) to help you reduce the amount of fat in your diet. What are tips for following this plan? General guidelines  Limit your fat intake to less than 30% of your total daily calories. If you eat around 1,800 calories each day, this is less than 60 grams (g) of fat per day.  Fat is an important part of a healthy diet. Eating a low-fat diet can make it hard to maintain a healthy body weight. Ask your dietitian how much fat, calories, and other nutrients you need each day.  Eat small, frequent meals throughout the day instead of three large meals.  Drink at least 8-10 cups of fluid a day. Drink enough fluid to keep your urine clear or pale yellow.  Limit alcohol intake to no more than 1 drink a day for nonpregnant women and 2 drinks a day for men. One drink equals 12 oz of beer, 5 oz of wine, or 1 oz of hard liquor. Reading food labels  Check Nutrition Facts on food labels for the amount of fat per serving. Choose foods with less than 3 grams of fat per serving. Shopping  Choose nonfat and low-fat healthy foods. Look for the words "nonfat," "low fat," or "fat free."  Avoid buying processed or prepackaged foods. Cooking  Cook using low-fat methods, such as  baking, broiling, grilling, or boiling.  Cook with small amounts of healthy fats, such as olive oil, grapeseed oil, canola oil, or sunflower oil. What foods are recommended?  All fresh, frozen, or canned fruits and vegetables.  Whole grains.  Low-fat or non-fat (skim) milk and yogurt.  Lean meat, skinless poultry, fish, eggs, and beans.  Low-fat protein supplement powders or drinks.  Spices and herbs. What foods are not recommended?  High-fat foods. These include baked goods, fast food, fatty cuts of meat, ice cream, french toast, sweet rolls, pizza, cheese bread, foods covered with butter, creamy sauces, or cheese.  Fried foods. These include french fries, tempura, battered fish, breaded chicken, fried breads, and sweets.  Foods with strong odors.  Foods that cause bloating and gas. Summary  A low-fat diet can be helpful if you have a gallbladder condition, or before and after gallbladder surgery.  Limit your fat intake to less than 30% of your total daily calories. This is about 60 g of fat if you eat 1,800 calories each day.  Eat small, frequent meals throughout the day instead of three large meals. This information is not intended to replace advice given to you by your health care provider. Make sure you discuss any questions you have with your health care provider. Document Released: 11/08/2013 Document Revised: 12/11/2016 Document Reviewed: 12/11/2016 Elsevier Interactive Patient Education  2019 [employer].

## 2019-02-21 NOTE — Telephone Encounter (Signed)
Epi gastric region sharp pain. Started about 2-3 hours ago. No vomiting. Drank only tea this morning. Same pain occurred 4 days ago that lasted approximately one and half days. Took tums unsure if it helped. LBM this am.  No diarrhea, no known fever. Pain does not radiate. No difficulty voiding. No travels no known exposures. Requesting appointment. Warm transferred call to [facility] for appointment needs.  Reason for Disposition . [1] MILD-MODERATE pain AND [2] constant AND [3] present > 2 hours  Answer Assessment - Initial Assessment Questions 1. LOCATION: "Where does it hurt?"     Upper abdomen 2. RADIATION: "Does the pain shoot anywhere else?" (e.g., chest, back)    no 3. ONSET: "When did the pain begin?" (e.g., minutes, hours or days ago)      2-3 hours ago 4. SUDDEN: "Gradual or sudden onset?"     suden 5. PATTERN "Does the pain come and go, or is it constant?"    - If constant: "Is it getting better, staying the same, or worsening?"      (Note: Constant means the pain never goes away completely; most serious pain is constant and it progresses)     - If intermittent: "How long does it last?" "Do you have pain now?"     (Note: Intermittent means the pain goes away completely between bouts)     constant 6. SEVERITY: "How bad is the pain?"  (e.g., Scale 1-10; mild, moderate, or severe)    - MILD (1-3): doesn't interfere with normal activities, abdomen soft and not tender to touch     - MODERATE (4-7): interferes with normal activities or awakens from sleep, tender to touch     - SEVERE (8-10): excruciating pain, doubled over, unable to do any normal activities       Knees bent 7. RECURRENT SYMPTOM: "Have you ever had this type of abdominal pain before?" If so, ask: "When was the last time?" and "What happened that time?"      Yes 4 days ago. 8. AGGRAVATING FACTORS: "Does anything seem to cause this pain?" (e.g., foods, stress, alcohol)     none 9. CARDIAC SYMPTOMS: "Do you have any of  the following symptoms: chest pain, difficulty breathing, sweating, nausea?"    None reported 10. OTHER SYMPTOMS: "Do you have any other symptoms?" (e.g., fever, vomiting, diarrhea)       no 11. PREGNANCY: "Is there any chance you are pregnant?" "When was your last menstrual period?"       na  Protocols used: ABDOMINAL PAIN - UPPER-A-AH

## 2019-02-21 NOTE — Telephone Encounter (Addendum)
For this complaint, patient is actually going to need an in-office appt TODAY or needs to go to urgent care TODAY; she will likely need labs and an abdominal exam; this is NOT best done over the phone based on the [facility] notes Thank you

## 2019-02-21 NOTE — Telephone Encounter (Signed)
Pt scheduled to see Lanora Manis, NP today.

## 2019-02-22 ENCOUNTER — Telehealth: Payer: Self-pay | Admitting: Nurse Practitioner

## 2019-02-22 ENCOUNTER — Other Ambulatory Visit: Payer: Self-pay | Admitting: Nurse Practitioner

## 2019-02-22 DIAGNOSIS — R1901 Right upper quadrant abdominal swelling, mass and lump: Secondary | ICD-10-CM

## 2019-02-22 DIAGNOSIS — R1013 Epigastric pain: Secondary | ICD-10-CM

## 2019-02-22 DIAGNOSIS — D72825 Bandemia: Secondary | ICD-10-CM

## 2019-02-22 LAB — CBC WITH DIFFERENTIAL/PLATELET
Absolute Monocytes: 384 cells/uL (ref 200–950)
Basophils Absolute: 45 cells/uL (ref 0–200)
Basophils Relative: 0.4 %
Eosinophils Absolute: 90 cells/uL (ref 15–500)
Eosinophils Relative: 0.8 %
HCT: 35.1 % (ref 35.0–45.0)
Hemoglobin: 11.6 g/dL — ABNORMAL LOW (ref 11.7–15.5)
Lymphs Abs: 2192 cells/uL (ref 850–3900)
MCH: 25.8 pg — ABNORMAL LOW (ref 27.0–33.0)
MCHC: 33 g/dL (ref 32.0–36.0)
MCV: 78 fL — ABNORMAL LOW (ref 80.0–100.0)
MPV: 10.1 fL (ref 7.5–12.5)
Monocytes Relative: 3.4 %
Neutro Abs: 8588 cells/uL — ABNORMAL HIGH (ref 1500–7800)
Neutrophils Relative %: 76 %
Platelets: 297 10*3/uL (ref 140–400)
RBC: 4.5 10*6/uL (ref 3.80–5.10)
RDW: 14.3 % (ref 11.0–15.0)
Total Lymphocyte: 19.4 %
WBC: 11.3 10*3/uL — ABNORMAL HIGH (ref 3.8–10.8)

## 2019-02-22 LAB — COMPLETE METABOLIC PANEL WITH GFR
AG Ratio: 1.2 (calc) (ref 1.0–2.5)
ALT: 15 U/L (ref 6–29)
AST: 18 U/L (ref 10–35)
Albumin: 4.1 g/dL (ref 3.6–5.1)
Alkaline phosphatase (APISO): 58 U/L (ref 37–153)
BUN: 11 mg/dL (ref 7–25)
CO2: 26 mmol/L (ref 20–32)
Calcium: 9.6 mg/dL (ref 8.6–10.4)
Chloride: 102 mmol/L (ref 98–110)
Creat: 0.56 mg/dL (ref 0.50–0.99)
GFR, Est African American: 115 mL/min/{1.73_m2} (ref >=60)
GFR, Est Non African American: 99 mL/min/{1.73_m2} (ref >=60)
Globulin: 3.4 g/dL (calc) (ref 1.9–3.7)
Glucose, Bld: 120 mg/dL — ABNORMAL HIGH (ref 65–99)
Potassium: 3.9 mmol/L (ref 3.5–5.3)
Sodium: 137 mmol/L (ref 135–146)
Total Bilirubin: 0.3 mg/dL (ref 0.2–1.2)
Total Protein: 7.5 g/dL (ref 6.1–8.1)

## 2019-02-22 LAB — H. PYLORI BREATH TEST

## 2019-02-22 NOTE — Telephone Encounter (Signed)
Daughter Manjit called all results and plan of care from result note discussed with her as well.

## 2019-03-01 ENCOUNTER — Ambulatory Visit: Payer: BLUE CROSS/BLUE SHIELD | Admitting: Urology

## 2019-03-02 ENCOUNTER — Encounter: Payer: Self-pay | Admitting: Urology

## 2019-03-02 ENCOUNTER — Ambulatory Visit (INDEPENDENT_AMBULATORY_CARE_PROVIDER_SITE_OTHER): Payer: BLUE CROSS/BLUE SHIELD | Admitting: Urology

## 2019-03-02 ENCOUNTER — Other Ambulatory Visit: Payer: Self-pay

## 2019-03-02 VITALS — BP 137/76 | HR 86 | Ht 61.0 in | Wt 168.0 lb

## 2019-03-02 DIAGNOSIS — R310 Gross hematuria: Secondary | ICD-10-CM

## 2019-03-02 LAB — URINALYSIS, COMPLETE
Bilirubin, UA: NEGATIVE
Glucose, UA: NEGATIVE
Ketones, UA: NEGATIVE
Leukocytes,UA: NEGATIVE
Nitrite, UA: NEGATIVE
Protein,UA: NEGATIVE
RBC, UA: NEGATIVE
Specific Gravity, UA: <1.005 — ABNORMAL LOW (ref 1.005–1.030)
Urobilinogen, Ur: 0.2 mg/dL (ref 0.2–1.0)
pH, UA: 6 (ref 5.0–7.5)

## 2019-03-02 LAB — MICROSCOPIC EXAMINATION
Bacteria, UA: NONE SEEN
Epithelial Cells (non renal): NONE SEEN /hpf (ref 0–10)
RBC, Urine: NONE SEEN /hpf (ref 0–2)
WBC, UA: NONE SEEN /hpf (ref 0–5)

## 2019-03-02 NOTE — Progress Notes (Signed)
03/02/2019 11:05 AM   Kristine Alexander 04/20/1956 [id number]  Referring provider: Kerman PasseyLada, Melinda P, MD [address] [place], [place] [id number]  Chief Complaint  Patient presents with  . Hematuria    New Patient    HPI: 63 yo F referred by her primary care physician, Dr. Sherie DonLada for further evaluation of gross hematuria.  He is accompanied today by a [place] interpreter.  She was seen and evaluated in the emergency room on 01/10/2019 with lower abdominal discomfort, urinary frequency and gross hematuria.  At that time, her urine had greater than 50 white blood cells, 21-50 red blood cells per high-powered field, rare bacteria.  Urine culture was ultimately negative.  With 10 days of Bactrim and Pyridium and her symptoms completely resolved.  She said no further urinary issues, or hematuria.  Her urine was checked by her primary care on 01/20/2019 which time she had no evidence of blood in her urine.  UA today is also negative.  She does report she had an episode similar last year treated with antibiotics and resolved.  She denies taking any antibiotics prior to urine culture in the emergency room.  She is a non-smoker.  No history of kidney stones.  No history of flank pain.    Sent cross-sectional imaging.  Hematuria protocol CT scan was ordered by her primary care physician but the patient has refused this.   PMH: Past Medical History:  Diagnosis Date  . Diabetes mellitus without complication (HCC)   . Hyperlipidemia   . Iron deficiency anemia   . Osteoarthritis of multiple joints   . Prediabetes 08/20/2017    Surgical History: Past Surgical History:  Procedure Laterality Date  . CATARACT EXTRACTION W/PHACO Right 02/25/2016   Procedure: CATARACT EXTRACTION PHACO AND INTRAOCULAR LENS PLACEMENT (IOC);  Surgeon: Sallee LangeSteven Dingeldein, MD;  Location: [facility] ORS;  Service: Ophthalmology;  Laterality: Right;  US    00:48.2 AP%   23.5 CDE   22.58 fluid pack lot # [id number]  .  CATARACT EXTRACTION W/PHACO Left 06/08/2018   Procedure: CATARACT EXTRACTION PHACO AND INTRAOCULAR LENS PLACEMENT (IOC);  Surgeon: Galen ManilaPorfilio, William, MD;  Location: [facility] ORS;  Service: Ophthalmology;  Laterality: Left;  US 00:28.4 AP% 13.0 CDE 3.68 Fluid pack Lot # [id number]  . TUBAL LIGATION      Home Medications:  Allergies as of 03/02/2019   No Known Allergies     Medication List       Accurate as of March 02, 2019 11:05 AM. Always use your most recent med list.        atorvastatin 10 MG tablet Commonly known as:  LIPITOR TAKE 1 TABLET(10 MG) BY MOUTH AT BEDTIME   famotidine 20 MG tablet Commonly known as:  PEPCID Take 1 tablet (20 mg total) by mouth 2 (two) times daily.   metFORMIN 500 MG 24 hr tablet Commonly known as:  GLUCOPHAGE-XR TAKE 2 TABLETS(1000 MG) BY MOUTH DAILY       Allergies: No Known Allergies  Family History: Family History  Problem Relation Age of Onset  . Hyperlipidemia Mother   . Hyperlipidemia Father   . Hyperlipidemia Sister   . Diabetes Brother   . Hyperlipidemia Brother     Social History:  reports that she has never smoked. She has never used smokeless tobacco. She reports that she does not drink alcohol or use drugs.  ROS: UROLOGY Frequent Urination?: Yes Hard to postpone urination?: No Burning/pain with urination?: No Get up at night to urinate?: No  Leakage of urine?: No Urine stream starts and stops?: No Trouble starting stream?: No Do you have to strain to urinate?: No Blood in urine?: Yes Urinary tract infection?: No Sexually transmitted disease?: No Injury to kidneys or bladder?: No Painful intercourse?: No Weak stream?: No Currently pregnant?: No Vaginal bleeding?: No Last menstrual period?: n  Gastrointestinal Nausea?: No Vomiting?: No Indigestion/heartburn?: No Diarrhea?: No Constipation?: No  Constitutional Fever: No Night sweats?: No Weight loss?: No Fatigue?: No  Skin Skin rash/lesions?: No  Itching?: No  Eyes Blurred vision?: No Double vision?: No  Ears/Nose/Throat Sore throat?: No Sinus problems?: No  Hematologic/Lymphatic Swollen glands?: No Easy bruising?: No  Cardiovascular Leg swelling?: No Chest pain?: No  Respiratory Cough?: No Shortness of breath?: No  Endocrine Excessive thirst?: No  Musculoskeletal Back pain?: No Joint pain?: No  Neurological Headaches?: No Dizziness?: No  Psychologic Depression?: No Anxiety?: No  Physical Exam: BP 137/76   Pulse 86   Ht 5\' 1"  (1.549 m)   Wt 168 lb (76.2 kg)   BMI 31.74 kg/m   Constitutional:  Alert and oriented, No acute distress.  Accompanied today by interpreter. HEENT:  AT, moist mucus membranes.  Trachea midline, no masses. Cardiovascular: No clubbing, cyanosis, or edema. Respiratory: Normal respiratory effort, no increased work of breathing. Abd: obese Skin: No rashes, bruises or suspicious lesions. Neurologic: Grossly intact, no focal deficits, moving all 4 extremities. Psychiatric: Normal mood and affect.  Laboratory Data: Lab Results  Component Value Date   WBC 11.3 (H) 02/21/2019   HGB 11.6 (L) 02/21/2019   HCT 35.1 02/21/2019   MCV 78.0 (L) 02/21/2019   PLT 297 02/21/2019    Lab Results  Component Value Date   CREATININE 0.56 02/21/2019    Lab Results  Component Value Date   HGBA1C 5.9 (H) 08/20/2018    Urinalysis Urinalysis today reviewed, see epic for details.  No evidence of blood on dip or on microscopic analysis.  Pertinent Imaging: n/a  Assessment & Plan:    1. Gross hematuria We discussed the differential diagnosis for gross hematuria today including infection, stones, malignancy, inflammation amongst others.  Given the patient's symptoms of abdominal pain and urinary frequency the time of her gross hematuria as well as the appearance of her urine, I am most suspicious for an infectious process.  Although her urine culture did not grow any bacteria, there  is the possibility of a false negative urine culture as well as the possibility of an atypical bacteria as a causative organism which would not grow nutritional urine culture.  She had 2 subsequent urinalysis without any evidence of microscopic blood no further recurrence of gross hematuria.  We will send urine cytology today as a precaution.  We did discuss the normal protocol for evaluation of gross hematuria to rule out the above processes including CT scan and cystoscopy.  The patient is very hesitant to proceed with any of this.  At this time, will hold off on pursuing this work-up but I would recommend that if she does ever have microscopic blood in her urine or recurrence of her gross hematuria, would highly recommend we pursue this.  The patient seems to understand this completely and is agreeable with the plan.  All questions were answered today.  - Urinalysis, Complete  F/u as needed  Vanna Scotland, MD  [facility] [address] [place], [place] [address] [phone]

## 2019-03-03 ENCOUNTER — Ambulatory Visit (INDEPENDENT_AMBULATORY_CARE_PROVIDER_SITE_OTHER): Payer: BLUE CROSS/BLUE SHIELD | Admitting: Family Medicine

## 2019-03-03 ENCOUNTER — Encounter: Payer: Self-pay | Admitting: Family Medicine

## 2019-03-03 ENCOUNTER — Telehealth: Payer: Self-pay | Admitting: Family Medicine

## 2019-03-03 DIAGNOSIS — Z91199 Patient's noncompliance with other medical treatment and regimen due to unspecified reason: Secondary | ICD-10-CM

## 2019-03-03 DIAGNOSIS — Z5329 Procedure and treatment not carried out because of patient's decision for other reasons: Secondary | ICD-10-CM

## 2019-03-03 NOTE — Telephone Encounter (Signed)
I tried to reach patient on two separate occasions today using language line She did not answer either time Messages were left  I called the daughter to ask her to check on her mother, make sure she is okay I explained that we can't reach her and I wanted her to be aware She was out walking both times she thinks Pain comes and goes; she says it was gone at 3:00 pm, "she was fine" she was walking Daughter thinks she is okay right now; she just wants the [place] Explained [place] will show some things and we'll get that, but CT would show others obviously I explained; they want the [place] She is going to get [place] Mar 22, 2019 Reasons to go to [facility] or ER reviewed with daughter Daughter would like [place] earlier than May 5th I will ask staff to move up ultrasound appointment since she is still having symptoms

## 2019-03-03 NOTE — Progress Notes (Signed)
There were no vitals taken for this visit.   Subjective:    Patient ID: Kristine Alexander, female    DOB: 07-07-1956, 63 y.o.   MRN: [id number]  HPI: Kristine Alexander is a 63 y.o. female  Chief Complaint  Patient presents with  . Abdominal Pain   [phone] Punjabi interpreter on the line around 2:45 pm We called bolded number first, that was the daughter [phone]; message left by [place] speaking interpreter; we will try her later today Call started: 2:47 am; message left for patient -------------------------------------------------------------------- [phone] Punjabi intepreter on the line at 4:26 pm Interpreter had to leave another message at 4:29 pm She explained that we were trying again to do her visit If her symptoms worsen, please go to urgent care  HPI Notes while trying to connect with patient: She actually saw the urologist yesterday Urine was checked; reviewed, normal She saw NP on February 21, 2019 H pylori breast test was not done due to insufficient CO2 sample CMP reviewed, glucose 120, all else normal CBC showed mild leukocytosis and patient was supposed to have CBC rechecked in 1-2 weeks, orders were entered by NP on February 22, 2019 She refused abd CT scan Abdominal US has been ordered and is schedled for Mar 22, 2019  We were not able to connect with the patient after two attempts using Language line interpreter; messages were left for her each time; 2nd message included info to go to urgent care if needed, if pain worsening  I will try to have my staff get her rescheduled  Fall Risk  03/03/2019 02/21/2019 01/20/2019 08/20/2018 02/18/2018  Falls in the past year? 0 0 0 No No  Number falls in past yr: - 0 0 - -  Injury with Fall? - 0 0 - -  Follow up - - Falls evaluation completed - -    Relevant past medical, surgical, family and social history reviewed Past Medical History:  Diagnosis Date  . Diabetes mellitus without complication (HCC)   .  Hyperlipidemia   . Iron deficiency anemia   . Osteoarthritis of multiple joints   . Prediabetes 08/20/2017   Past Surgical History:  Procedure Laterality Date  . CATARACT EXTRACTION W/PHACO Right 02/25/2016   Procedure: CATARACT EXTRACTION PHACO AND INTRAOCULAR LENS PLACEMENT (IOC);  Surgeon: Sallee LangeSteven Dingeldein, MD;  Location: [facility] ORS;  Service: Ophthalmology;  Laterality: Right;  US    00:48.2 AP%   23.5 CDE   22.58 fluid pack lot # [id number]  . CATARACT EXTRACTION W/PHACO Left 06/08/2018   Procedure: CATARACT EXTRACTION PHACO AND INTRAOCULAR LENS PLACEMENT (IOC);  Surgeon: Galen ManilaPorfilio, William, MD;  Location: [facility] ORS;  Service: Ophthalmology;  Laterality: Left;  US 00:28.4 AP% 13.0 CDE 3.68 Fluid pack Lot # [id number]  . TUBAL LIGATION     Family History  Problem Relation Age of Onset  . Hyperlipidemia Mother   . Hyperlipidemia Father   . Hyperlipidemia Sister   . Diabetes Brother   . Hyperlipidemia Brother    Social History   Tobacco Use  . Smoking status: Never Smoker  . Smokeless tobacco: Never Used  Substance Use Topics  . Alcohol use: No    Alcohol/week: 0.0 standard drinks  . Drug use: No     [facility]  AUDIT-C Score  0      Interim medical history since last visit reviewed. Allergies and medications reviewed  Review of Systems Per HPI unless specifically indicated above  Objective:    There were no vitals taken for this visit.  Wt Readings from Last 3 Encounters:  03/02/19 168 lb (76.2 kg)  02/21/19 166 lb 14.4 oz (75.7 kg)  01/20/19 168 lb 14.4 oz (76.6 kg)    Physical Exam  Results for orders placed or performed in visit on 03/02/19  Microscopic Examination  Result Value Ref Range   WBC, UA None seen 0 - 5 /hpf   RBC None seen 0 - 2 /hpf   Epithelial Cells (non renal) None seen 0 - 10 /hpf   Bacteria, UA None seen None seen/Few  Urinalysis, Complete  Result Value Ref Range   Specific Gravity,  UA <1.005 (L) 1.005 - 1.030   pH, UA 6.0 5.0 - 7.5   Color, UA Clear Yellow   Appearance Ur Clear Clear   Leukocytes,UA Negative Negative   Protein,UA Negative Negative/Trace   Glucose, UA Negative Negative   Ketones, UA Negative Negative   RBC, UA Negative Negative   Bilirubin, UA Negative Negative   Urobilinogen, Ur 0.2 0.2 - 1.0 mg/dL   Nitrite, UA Negative Negative   Microscopic Examination See below:       Assessment & Plan:   Problem List Items Addressed This Visit    None    Visit Diagnoses    No-show for appointment    -  Primary       Follow up plan: No follow-ups on file.  An after-visit summary was printed and given to the patient at check-out.  Please see the patient instructions which may contain other information and recommendations beyond what is mentioned above in the assessment and plan.  No orders of the defined types were placed in this encounter.   No orders of the defined types were placed in this encounter.

## 2019-03-04 ENCOUNTER — Telehealth: Payer: Self-pay

## 2019-03-04 NOTE — Telephone Encounter (Signed)
Scheduled patient for Monday, 03/07/19 at [facility] for 9:30am. Called patient to notify, spoke with daughter and they refused to go to [facility] and said they would just keep the May 5th appt in [place].

## 2019-03-04 NOTE — Telephone Encounter (Signed)
Spoke with daughter, May appt will be kept.   Copied from CRM [id number]. Topic: General - Inquiry >> Mar 04, 2019 10:06 AM Reggie Pile, NT wrote: Reason for CRM: Patient's daughter called and is stating that she now may have a ride to [facility] for that appointment and would like to see if it would be possible to do. Please call back at [phone] >> Mar 04, 2019 10:33 AM Violeta Gelinas B wrote: Please advise patient

## 2019-03-07 ENCOUNTER — Ambulatory Visit: Payer: BLUE CROSS/BLUE SHIELD

## 2019-03-10 ENCOUNTER — Other Ambulatory Visit: Payer: Self-pay | Admitting: Urology

## 2019-03-22 ENCOUNTER — Ambulatory Visit: Payer: BLUE CROSS/BLUE SHIELD

## 2019-03-22 ENCOUNTER — Ambulatory Visit
Admission: RE | Admit: 2019-03-22 | Discharge: 2019-03-22 | Disposition: A | Discharge status: Home / Self Care | Payer: BLUE CROSS/BLUE SHIELD | Source: Ambulatory Visit | Attending: Nurse Practitioner | Admitting: Nurse Practitioner

## 2019-03-22 ENCOUNTER — Other Ambulatory Visit: Payer: Self-pay | Admitting: Nurse Practitioner

## 2019-03-22 ENCOUNTER — Other Ambulatory Visit: Payer: Self-pay

## 2019-03-22 DIAGNOSIS — K8020 Calculus of gallbladder without cholecystitis without obstruction: Secondary | ICD-10-CM | POA: Insufficient documentation

## 2019-03-22 DIAGNOSIS — R1901 Right upper quadrant abdominal swelling, mass and lump: Secondary | ICD-10-CM | POA: Diagnosis present

## 2019-03-22 DIAGNOSIS — R1013 Epigastric pain: Secondary | ICD-10-CM | POA: Insufficient documentation

## 2019-03-22 DIAGNOSIS — K760 Fatty (change of) liver, not elsewhere classified: Secondary | ICD-10-CM | POA: Insufficient documentation

## 2019-03-22 DIAGNOSIS — D72825 Bandemia: Secondary | ICD-10-CM | POA: Insufficient documentation

## 2019-03-22 HISTORY — DX: Calculus of gallbladder without cholecystitis without obstruction: K80.20

## 2019-03-31 ENCOUNTER — Telehealth: Payer: Self-pay | Admitting: Family Medicine

## 2019-03-31 NOTE — Telephone Encounter (Signed)
Not able to leave message to inform pt that script has been sent to pharmacy and to [facility] her to schedule follow up within the next 30 days

## 2019-03-31 NOTE — Telephone Encounter (Signed)
Please schedule patient for follow up in the next 30 days.  

## 2019-04-13 ENCOUNTER — Ambulatory Visit: Payer: BLUE CROSS/BLUE SHIELD | Admitting: General Surgery

## 2019-04-13 ENCOUNTER — Other Ambulatory Visit: Payer: Self-pay

## 2019-04-19 ENCOUNTER — Telehealth: Payer: Self-pay | Admitting: Family Medicine

## 2019-04-19 NOTE — Telephone Encounter (Signed)
Pt was referred to [facility] and pt states that they do not accept her insurance (bcbs). Pt called the insurance company and they gave her a list of where she can go. Its anyone in the [facility] network. She left three names and will fax over additional names tomorrow. I did suggest for her to just pick someone but she wasn't certain who was a Careers adviser and who wasnt  Clint Bolder, MD Lafonda Mosses [place] [facility] [address] [place] [address] [phone]

## 2019-04-19 NOTE — Telephone Encounter (Signed)
Please review this message from the patient and if you do not take her insurance then please send the referral back to [place].  Thanks :-)

## 2019-04-20 NOTE — Telephone Encounter (Signed)
I have no idea if we take her insurance or not. That is a question better directed to our administrative staff.  As far as I know, we do accept BC/BS, but perhaps we are out of her network?

## 2019-04-25 NOTE — Telephone Encounter (Signed)
Copied from [place] [id number]. Topic: General - Inquiry >> Apr 22, 2019  3:55 PM Rainey Pines A wrote: Patient is calling to check the status of the referral information that she faxed over. Patient stated that the referral that was scheduled for [facility] is out of network for them. Patient would like a callback.   Referral has been sent to Dr. Dianna Rossetti. Stephanie Acre with [facility]

## 2019-04-27 ENCOUNTER — Ambulatory Visit: Payer: BLUE CROSS/BLUE SHIELD | Admitting: General Surgery

## 2019-04-28 ENCOUNTER — Other Ambulatory Visit: Payer: Self-pay | Admitting: Family Medicine

## 2019-04-28 NOTE — Telephone Encounter (Signed)
Pt called stating she called [facility]: [phone]. They told her they have not received referral. They need it to be marked as urgent to give pt appt within a week. Otherwise there is a much longer wait for pt to be seen. Please advise on updating referral.

## 2019-04-28 NOTE — Telephone Encounter (Signed)
I called this patient back and gave her the information to the office in which she was referred to and stated that although it was faxed it takes some time for it to be processed and reviewed by their staff/ providers prior to them calling her and scheduling but that she was more than welcome to give their office a call.   The number [phone] was given to her and she said thanks

## 2019-04-28 NOTE — Telephone Encounter (Signed)
Pt daughter calling back and states that [facility] has not received referral. Pt daughter would like a call back regarding. Please advise

## 2019-04-28 NOTE — Telephone Encounter (Signed)
Referral will be re-faxed with confirmation and urgent will be written on top.

## 2019-05-16 ENCOUNTER — Other Ambulatory Visit: Payer: Self-pay

## 2019-05-16 ENCOUNTER — Ambulatory Visit (INDEPENDENT_AMBULATORY_CARE_PROVIDER_SITE_OTHER): Payer: BLUE CROSS/BLUE SHIELD | Admitting: General Surgery

## 2019-05-16 ENCOUNTER — Encounter
Admission: RE | Admit: 2019-05-16 | Discharge: 2019-05-16 | Disposition: A | Discharge status: Home / Self Care | Payer: BLUE CROSS/BLUE SHIELD | Source: Ambulatory Visit | Attending: General Surgery | Admitting: General Surgery

## 2019-05-16 ENCOUNTER — Encounter: Payer: Self-pay | Admitting: General Surgery

## 2019-05-16 VITALS — BP 156/73 | HR 66 | Temp 97.3°F | Ht 60.0 in | Wt 164.0 lb

## 2019-05-16 DIAGNOSIS — E785 Hyperlipidemia, unspecified: Secondary | ICD-10-CM | POA: Insufficient documentation

## 2019-05-16 DIAGNOSIS — Z01818 Encounter for other preprocedural examination: Secondary | ICD-10-CM | POA: Diagnosis present

## 2019-05-16 DIAGNOSIS — E119 Type 2 diabetes mellitus without complications: Secondary | ICD-10-CM | POA: Diagnosis not present

## 2019-05-16 DIAGNOSIS — Z1159 Encounter for screening for other viral diseases: Secondary | ICD-10-CM | POA: Diagnosis not present

## 2019-05-16 DIAGNOSIS — K8020 Calculus of gallbladder without cholecystitis without obstruction: Secondary | ICD-10-CM

## 2019-05-16 NOTE — Patient Instructions (Signed)
Your procedure is scheduled on: Thursday 05/19/19 Report to [facility]. To find out your arrival time please call [phone] between 1PM - 3PM on Wednesday 05/18/19.  Remember: Instructions that are not followed completely may result in serious medical risk, up to and including death, or upon the discretion of your surgeon and anesthesiologist your surgery may need to be rescheduled.     _X__ 1. Do not eat food after midnight the night before your procedure.                 No gum chewing or hard candies. You may drink clear liquids up to 2 hours                 before you are scheduled to arrive for your surgery- DO not drink clear                 liquids within 2 hours of the start of your surgery.                 Clear Liquids include:  water, apple juice without pulp, clear carbohydrate                 drink such as Clearfast or Gatorade, Black Coffee or Tea (Do not add                 anything to coffee or tea). Diabetics water only  __X__2.  On the morning of surgery brush your teeth with toothpaste and water, you                 may rinse your mouth with mouthwash if you wish.  Do not swallow any              toothpaste of mouthwash.     _X__ 3.  No Alcohol for 24 hours before or after surgery.   _X__ 4.  Do Not Smoke or use e-cigarettes For 24 Hours Prior to Your Surgery.                 Do not use any chewable tobacco products for at least 6 hours prior to                 surgery.  ____  5.  Bring all medications with you on the day of surgery if instructed.   __X__  6.  Notify your doctor if there is any change in your medical condition      (cold, fever, infections).     Do not wear jewelry, make-up, hairpins, clips or nail polish. Do not wear lotions, powders, or perfumes.  Do not shave 48 hours prior to surgery. Men may shave face and neck. Do not bring valuables to the hospital.    [facility] is not responsible for  any belongings or valuables.  Contacts, dentures/partials or body piercings may not be worn into surgery. Bring a case for your contacts, glasses or hearing aids, a denture cup will be supplied. Leave your suitcase in the car. After surgery it may be brought to your room. For patients admitted to the hospital, discharge time is determined by your treatment team.   Patients discharged the day of surgery will not be allowed to drive home.   Please read over the following fact sheets that you were given:   MRSA Information  __X__ Take these medicines the morning of surgery with A SIP OF WATER:  1. famotidine (PEPCID)  2.   3.   4.  5.  6.  ____ Fleet Enema (as directed)   __X__ Use CHG Soap/SAGE wipes as directed  ____ Use inhalers on the day of surgery  __X__ Stop metformin/Janumet/Farxiga 2 days prior to surgery STOP TODAY, RESTART AFTER SURGERY   ____ Take 1/2 of usual insulin dose the night before surgery. No insulin the morning          of surgery.   ____ Stop Blood Thinners Coumadin/Plavix/Xarelto/Pleta/Pradaxa/Eliquis/Effient/Aspirin  on   Or contact your Surgeon, Cardiologist or Medical Doctor regarding  ability to stop your blood thinners  __X__ Stop Anti-inflammatories 7 days before surgery such as Advil, Ibuprofen, Motrin,  BC or Goodies Powder, Naprosyn, Naproxen, Aleve, Aspirin    __X__ Stop all herbal supplements, fish oil or vitamin E until after surgery.    ____ Bring C-Pap to the hospital.

## 2019-05-16 NOTE — Progress Notes (Signed)
Patient ID: Kristine Alexander, female   DOB: January 30, 1956, 63 y.o.   MRN: [id number]  Chief Complaint  Patient presents with  . Other    HPI Kristine Alexander is a 63 y.o. female. Ms. Hessie Alexander is a 63 year old woman referred by Dr. Sherie DonLada surgical evaluation of cholelithiasis.  Today's interview is performed with the assistance of a telephone interpreter of Punjabi as well as the patient's family members.  The telephone interpreter provided translation services for the key portions of the history and physical as well as the surgical consent discussion.  She gives a history of several months intermittent epigastric and right upper quadrant pain.  It occasionally radiates to her back.  She denies any fevers or chills.  She has not experienced vomiting, but has felt nauseated on several occasions.  She is unable to associate the pain with specific foods.  She has not had diarrhea.  She denies any history of jaundice or pancreatitis.  Her most recent episode was 2 days ago.  Her primary care provider obtained a right upper quadrant ultrasound that was consistent with cholelithiasis.  There is no evidence of cholecystitis or choledocholithiasis.  Labs obtained in April did not show any signs of biliary obstruction, although her white blood cell count was slightly elevated.     Past Medical History:  Diagnosis Date  . Diabetes mellitus without complication (HCC)   . Hyperlipidemia   . Iron deficiency anemia   . Osteoarthritis of multiple joints   . Prediabetes 08/20/2017    Past Surgical History:  Procedure Laterality Date  . CATARACT EXTRACTION W/PHACO Right 02/25/2016   Procedure: CATARACT EXTRACTION PHACO AND INTRAOCULAR LENS PLACEMENT (IOC);  Surgeon: Sallee LangeSteven Dingeldein, MD;  Location: [facility] ORS;  Service: Ophthalmology;  Laterality: Right;  US    00:48.2 AP%   23.5 CDE   22.58 fluid pack lot # [id number]  . CATARACT EXTRACTION W/PHACO Left 06/08/2018   Procedure: CATARACT EXTRACTION PHACO AND  INTRAOCULAR LENS PLACEMENT (IOC);  Surgeon: Galen ManilaPorfilio, William, MD;  Location: [facility] ORS;  Service: Ophthalmology;  Laterality: Left;  US 00:28.4 AP% 13.0 CDE 3.68 Fluid pack Lot # [id number]  . TUBAL LIGATION      Family History  Problem Relation Age of Onset  . Hyperlipidemia Mother   . Hyperlipidemia Father   . Hyperlipidemia Sister   . Diabetes Brother   . Hyperlipidemia Brother     Social History Social History   Tobacco Use  . Smoking status: Never Smoker  . Smokeless tobacco: Never Used  Substance Use Topics  . Alcohol use: No    Alcohol/week: 0.0 standard drinks  . Drug use: No    No Known Allergies  Current Outpatient Medications  Medication Sig Dispense Refill  . atorvastatin (LIPITOR) 10 MG tablet TAKE 1 TABLET(10 MG) BY MOUTH AT BEDTIME 90 tablet 1  . famotidine (PEPCID) 20 MG tablet Take 1 tablet (20 mg total) by mouth 2 (two) times daily. 60 tablet 0  . metFORMIN (GLUCOPHAGE-XR) 500 MG 24 hr tablet TAKE 2 TABLETS(1000 MG) BY MOUTH DAILY 60 tablet 0   No current facility-administered medications for this visit.     Review of Systems Review of Systems  All other systems reviewed and are negative.   Blood pressure (!) 156/73, pulse 66, temperature (!) 97.3 F (36.3 C), temperature source Skin, height 5' (1.524 m), weight 164 lb (74.4 kg), SpO2 98 %.  Physical Exam Physical Exam Vitals signs reviewed.  Constitutional:      General: She is not  in acute distress.    Appearance: She is obese.  HENT:     Head: Normocephalic and atraumatic.     Nose:     Comments: Covered with a mask secondary to COVID-19 precautions    Mouth/Throat:     Comments: Covered with a mask secondary to COVID-19 precautions Eyes:     General: No scleral icterus.    Extraocular Movements: Extraocular movements intact.     Conjunctiva/sclera: Conjunctivae normal.  Neck:     Musculoskeletal: Normal range of motion. No neck rigidity.  Cardiovascular:     Rate and Rhythm: Normal  rate and regular rhythm.     Pulses: Normal pulses.  Pulmonary:     Effort: Pulmonary effort is normal.     Breath sounds: Normal breath sounds.  Abdominal:     General: Bowel sounds are normal.     Palpations: Abdomen is soft.     Tenderness: There is abdominal tenderness. There is guarding.     Comments: Tenderness in the mid epigastrium and right upper quadrant.  There is voluntary guarding present.  Murphy sign is negative  Genitourinary:    Comments: Deferred Musculoskeletal: Normal range of motion.        General: No swelling.  Lymphadenopathy:     Cervical: No cervical adenopathy.  Skin:    General: Skin is warm and dry.  Neurological:     General: No focal deficit present.     Mental Status: She is alert.  Psychiatric:        Mood and Affect: Mood normal.        Behavior: Behavior normal.     Data Reviewed I reviewed the laboratory studies obtained by her primary care provider in April 2020.  As discussed under the history of present illness, there is no evidence of biliary obstruction.  She does have a mild elevation to her white blood cell count.  I also reviewed the right upper quadrant ultrasound.  This was performed on May 5 of 2020.  Multiple gallstones are present.  There is no pericholecystic fluid.  There is no gallbladder wall thickening.  There is no biliary dilatation.  This is in accordance with the radiologist interpretation.  Assessment Kristine Alexander is a 63 year old woman who has several months history of biliary colic secondary to cholelithiasis.  I have recommended that she undergo a laparoscopic cholecystectomy.  The risks of the operation were discussed with her with the assistance of the telephone interpreter.  These include, but are not limited to, bleeding, infection, injury to surrounding tissues or structures, retained stone hesitating ERCP or other intervention, bile leak requiring drain and/or stent placement, need to convert to an open procedure.   She agreed to accept these risks.  Plan We will proceed with laparoscopic cholecystectomy.  Orders have been placed including COVID-19 testing.    Fredirick Maudlin 05/16/2019, 9:57 AM

## 2019-05-16 NOTE — H&P (View-Only) (Signed)
Patient ID: Kristine Alexander, female   DOB: 04/24/1956, 63 y.o.   MRN: [id number]  Chief Complaint  Patient presents with  . Other    HPI Kristine Alexander is a 63 y.o. female. Ms. Lapine is a 63-year-old woman referred by Dr. Lada surgical evaluation of cholelithiasis.  Today's interview is performed with the assistance of a telephone interpreter of Punjabi as well as the patient's family members.  The telephone interpreter provided translation services for the key portions of the history and physical as well as the surgical consent discussion.  She gives a history of several months intermittent epigastric and right upper quadrant pain.  It occasionally radiates to her back.  She denies any fevers or chills.  She has not experienced vomiting, but has felt nauseated on several occasions.  She is unable to associate the pain with specific foods.  She has not had diarrhea.  She denies any history of jaundice or pancreatitis.  Her most recent episode was 2 days ago.  Her primary care provider obtained a right upper quadrant ultrasound that was consistent with cholelithiasis.  There is no evidence of cholecystitis or choledocholithiasis.  Labs obtained in April did not show any signs of biliary obstruction, although her white blood cell count was slightly elevated.     Past Medical History:  Diagnosis Date  . Diabetes mellitus without complication (HCC)   . Hyperlipidemia   . Iron deficiency anemia   . Osteoarthritis of multiple joints   . Prediabetes 08/20/2017    Past Surgical History:  Procedure Laterality Date  . CATARACT EXTRACTION W/PHACO Right 02/25/2016   Procedure: CATARACT EXTRACTION PHACO AND INTRAOCULAR LENS PLACEMENT (IOC);  Surgeon: Steven Dingeldein, MD;  Location: [facility] ORS;  Service: Ophthalmology;  Laterality: Right;  US    00:48.2 AP%   23.5 CDE   22.58 fluid pack lot # [id number]  . CATARACT EXTRACTION W/PHACO Left 06/08/2018   Procedure: CATARACT EXTRACTION PHACO AND  INTRAOCULAR LENS PLACEMENT (IOC);  Surgeon: Porfilio, William, MD;  Location: [facility] ORS;  Service: Ophthalmology;  Laterality: Left;  US 00:28.4 AP% 13.0 CDE 3.68 Fluid pack Lot # [id number]  . TUBAL LIGATION      Family History  Problem Relation Age of Onset  . Hyperlipidemia Mother   . Hyperlipidemia Father   . Hyperlipidemia Sister   . Diabetes Brother   . Hyperlipidemia Brother     Social History Social History   Tobacco Use  . Smoking status: Never Smoker  . Smokeless tobacco: Never Used  Substance Use Topics  . Alcohol use: No    Alcohol/week: 0.0 standard drinks  . Drug use: No    No Known Allergies  Current Outpatient Medications  Medication Sig Dispense Refill  . atorvastatin (LIPITOR) 10 MG tablet TAKE 1 TABLET(10 MG) BY MOUTH AT BEDTIME 90 tablet 1  . famotidine (PEPCID) 20 MG tablet Take 1 tablet (20 mg total) by mouth 2 (two) times daily. 60 tablet 0  . metFORMIN (GLUCOPHAGE-XR) 500 MG 24 hr tablet TAKE 2 TABLETS(1000 MG) BY MOUTH DAILY 60 tablet 0   No current facility-administered medications for this visit.     Review of Systems Review of Systems  All other systems reviewed and are negative.   Blood pressure (!) 156/73, pulse 66, temperature (!) 97.3 F (36.3 C), temperature source Skin, height 5' (1.524 m), weight 164 lb (74.4 kg), SpO2 98 %.  Physical Exam Physical Exam Vitals signs reviewed.  Constitutional:      General: She is not   in acute distress.    Appearance: She is obese.  HENT:     Head: Normocephalic and atraumatic.     Nose:     Comments: Covered with a mask secondary to COVID-19 precautions    Mouth/Throat:     Comments: Covered with a mask secondary to COVID-19 precautions Eyes:     General: No scleral icterus.    Extraocular Movements: Extraocular movements intact.     Conjunctiva/sclera: Conjunctivae normal.  Neck:     Musculoskeletal: Normal range of motion. No neck rigidity.  Cardiovascular:     Rate and Rhythm: Normal  rate and regular rhythm.     Pulses: Normal pulses.  Pulmonary:     Effort: Pulmonary effort is normal.     Breath sounds: Normal breath sounds.  Abdominal:     General: Bowel sounds are normal.     Palpations: Abdomen is soft.     Tenderness: There is abdominal tenderness. There is guarding.     Comments: Tenderness in the mid epigastrium and right upper quadrant.  There is voluntary guarding present.  Murphy sign is negative  Genitourinary:    Comments: Deferred Musculoskeletal: Normal range of motion.        General: No swelling.  Lymphadenopathy:     Cervical: No cervical adenopathy.  Skin:    General: Skin is warm and dry.  Neurological:     General: No focal deficit present.     Mental Status: She is alert.  Psychiatric:        Mood and Affect: Mood normal.        Behavior: Behavior normal.     Data Reviewed I reviewed the laboratory studies obtained by her primary care provider in April 2020.  As discussed under the history of present illness, there is no evidence of biliary obstruction.  She does have a mild elevation to her white blood cell count.  I also reviewed the right upper quadrant ultrasound.  This was performed on May 5 of 2020.  Multiple gallstones are present.  There is no pericholecystic fluid.  There is no gallbladder wall thickening.  There is no biliary dilatation.  This is in accordance with the radiologist interpretation.  Assessment Kristine Alexander is a 63 year old woman who has several months history of biliary colic secondary to cholelithiasis.  I have recommended that she undergo a laparoscopic cholecystectomy.  The risks of the operation were discussed with her with the assistance of the telephone interpreter.  These include, but are not limited to, bleeding, infection, injury to surrounding tissues or structures, retained stone hesitating ERCP or other intervention, bile leak requiring drain and/or stent placement, need to convert to an open procedure.   She agreed to accept these risks.  Plan We will proceed with laparoscopic cholecystectomy.  Orders have been placed including COVID-19 testing.    Fredirick Maudlin 05/16/2019, 9:57 AM

## 2019-05-16 NOTE — Patient Instructions (Addendum)
Patient's surgery to be scheduled for 05/19/19 at [facility] with Dr. Celine Ahr.   The patient is aware she will need to Pre-Admit today. Patient will check in at the [place] entrance due to COVID-19 restrictions and will then be escorted to the [place], [address] (first floor). She will also be tested for Covid-19.  Patient aware to be NPO after midnight and have a driver.   She is aware to check in at the [place] entrance where she will be screened for the coronavirus and then sent to [facility].   Patient aware that she may have no visitors and driver will need to wait in the car due to COVID-19 restrictions.   The patient verbalizes understanding of the above.   The patient is aware to call the office should she have further questions.      Cholecystostomy Cholecystostomy is a procedure to drain fluid from the gallbladder by using a flexible drainage tube (catheter). The gallbladder is a pear-shaped organ that lies beneath the liver on the right side of the body. The gallbladder stores bile, which is a fluid that helps the body digest fats. You may have this procedure:  If your gallbladder is infected due to gallstones (cholecystitis).  To control a gallbladder infection if you cannot have gallbladder surgery. Tell a health care provider about:  Any allergies you have.  All medicines you are taking, including vitamins, herbs, eye drops, creams, and over-the-counter medicines.  Any problems you or family members have had with anesthetic medicines.  Any blood disorders you have.  Any surgeries you have had.  Any medical conditions you have.  Whether you are pregnant or may be pregnant. What are the risks? Generally, this is a safe procedure. However, problems may occur, including:  The catheter moving out of place.  Clogging of the catheter.  Infection of the incision site.  Internal bleeding.  Leakage of bile from the gallbladder.  Infection  inside the abdomen (peritonitis).  Damage to other structures or organs.  Low blood pressure and slowed heart rate.  Allergic reactions to medicines or dyes. What happens before the procedure? Most often, you will already be in the hospital receiving treatment for a gallbladder infection or other problems with the gallbladder. Staying hydrated Follow instructions from your health care provider about hydration, which may include:  Up to 2 hours before the procedure - you may continue to drink clear liquids, such as water, clear fruit juice, black coffee, and plain tea.  Eating and drinking Follow instructions from your health care provider about eating and drinking, which may include:  8 hours before the procedure - stop eating heavy meals or foods, such as meat, fried foods, or fatty foods.  6 hours before the procedure - stop eating light meals or foods, such as toast or cereal.  6 hours before the procedure - stop drinking milk or drinks that contain milk.  2 hours before the procedure - stop drinking clear liquids. Medicines  Ask your health care provider about: ? Changing or stopping your regular medicines. This is especially important if you are taking diabetes medicines or blood thinners. ? Taking medicines such as aspirin and ibuprofen. These medicines can thin your blood. Do not take these medicines unless your health care provider tells you to take them. ? Taking over-the-counter medicines, vitamins, herbs, and supplements. General instructions  You may have an exam or testing, including: ? Imaging studies of your gallbladder. ? Blood tests.  Do not use any products that contain nicotine or tobacco before the procedure. These products include cigarettes, e-cigarettes, and chewing tobacco. If you need help quitting, ask your health care provider. Also, do not use these products after the procedure.  Plan to have someone take you home from the hospital or clinic.  If  you will be going home right after the procedure, plan to have someone with you for 24 hours.  Ask your health care provider how your surgical site will be marked or identified.  Ask your health care provider what steps will be taken to help prevent infection. These may include: ? Removing hair at the surgery site. ? Washing skin with a germ-killing soap. ? Taking antibiotic medicine. What happens during the procedure?  An IV will be inserted into one of your veins.  You will be given one or more of the following: ? A medicine to help you relax (sedative). ? A medicine to numb the area (local anesthetic).  A small incision will be made in your abdomen.  A long needle or a wide puncturing tool (trocar) will be put through the incision.  Your health care provider will use an imaging study (ultrasound) to guide the needle or trocar into your gallbladder.  After the needle or trocar is in your gallbladder, a small amount of dye may be injected. An X-ray may be taken to make sure that the needle is in the correct place.  A catheter will be placed through the needle.  The needle or trocar will be removed.  The catheter will be secured to your skin with stitches (sutures).  The catheter will be connected to a drainage bag. Fluid will drain from the gallbladder into the bag. Some of this bile may be sent to the lab to be tested.  A bandage (dressing) will be placed over the incision site where the catheter was placed. The procedure may vary among health care providers and hospitals. What happens after the procedure?  Your blood pressure, heart rate, breathing rate, and blood oxygen level will be monitored until you leave the hospital or clinic.  Dye may be injected through your catheter to check the catheter and your gallbladder.  Your gallbladder may be flushed out (irrigated) through the catheter.  Your catheter and drainage bag may need to stay in place for several weeks or as  told by your health care provider. Summary  Cholecystostomy is a procedure to drain fluid from the gallbladder using a flexible drainage tube (catheter).  Generally, this is a safe procedure. However, problems may occur, including bleeding, infection, clogging of the catheter, damage to other structures or organs, or low blood pressure and slowed heart rate.  Follow instructions before the procedure. You will be told when to stop all food and drink, whether to change or stop any medicines, and what tests need to be done.  After the procedure, you will be monitored in the hospital, and you may have a catheter and drainage bag when you go home. This information is not intended to replace advice given to you by your health care provider. Make sure you discuss any questions you have with your health care provider. Document Released: 01/30/2009 Document Revised: 05/31/2018 Document Reviewed: 05/31/2018 Elsevier Patient Education  2020 [employer]

## 2019-05-17 LAB — NOVEL CORONAVIRUS, NAA (HOSP ORDER, SEND-OUT TO REF LAB; TAT 18-24 HRS): SARS-CoV-2, NAA: NOT DETECTED

## 2019-05-18 MED ORDER — CEFAZOLIN SODIUM-DEXTROSE 2-4 GM/100ML-% IV SOLN
2.0000 g | INTRAVENOUS | Status: AC
  Administered 2019-05-19: 2 g via INTRAVENOUS

## 2019-05-19 ENCOUNTER — Other Ambulatory Visit: Payer: Self-pay

## 2019-05-19 ENCOUNTER — Ambulatory Visit
Admission: RE | Admit: 2019-05-19 | Discharge: 2019-05-19 | Disposition: A | Discharge status: Home / Self Care | Payer: BLUE CROSS/BLUE SHIELD | Attending: General Surgery | Admitting: General Surgery

## 2019-05-19 ENCOUNTER — Encounter
Admission: RE | Disposition: A | Discharge status: Home / Self Care | Payer: Self-pay | Source: Home / Self Care | Attending: General Surgery

## 2019-05-19 ENCOUNTER — Ambulatory Visit: Payer: BLUE CROSS/BLUE SHIELD | Admitting: Anesthesiology

## 2019-05-19 ENCOUNTER — Encounter: Payer: Self-pay | Admitting: *Deleted

## 2019-05-19 DIAGNOSIS — E785 Hyperlipidemia, unspecified: Secondary | ICD-10-CM | POA: Diagnosis not present

## 2019-05-19 DIAGNOSIS — E119 Type 2 diabetes mellitus without complications: Secondary | ICD-10-CM | POA: Diagnosis not present

## 2019-05-19 DIAGNOSIS — M1990 Unspecified osteoarthritis, unspecified site: Secondary | ICD-10-CM | POA: Diagnosis not present

## 2019-05-19 DIAGNOSIS — D509 Iron deficiency anemia, unspecified: Secondary | ICD-10-CM | POA: Diagnosis not present

## 2019-05-19 DIAGNOSIS — K8020 Calculus of gallbladder without cholecystitis without obstruction: Secondary | ICD-10-CM | POA: Insufficient documentation

## 2019-05-19 DIAGNOSIS — Z7984 Long term (current) use of oral hypoglycemic drugs: Secondary | ICD-10-CM | POA: Insufficient documentation

## 2019-05-19 HISTORY — PX: CHOLECYSTECTOMY: SHX55

## 2019-05-19 LAB — GLUCOSE, CAPILLARY
Glucose-Capillary: 98 mg/dL (ref 70–99)
Glucose-Capillary: 132 mg/dL — ABNORMAL HIGH (ref 70–99)

## 2019-05-19 SURGERY — LAPAROSCOPIC CHOLECYSTECTOMY
Anesthesia: General | Site: Abdomen

## 2019-05-19 MED ORDER — FENTANYL CITRATE (PF) 100 MCG/2ML IJ SOLN
25.0000 ug | INTRAMUSCULAR | Status: DC | PRN
  Administered 2019-05-19 (×4): 25 ug via INTRAVENOUS

## 2019-05-19 MED ORDER — BUPIVACAINE HCL (PF) 0.25 % IJ SOLN
INTRAMUSCULAR | Status: AC
  Filled 2019-05-19: qty 30

## 2019-05-19 MED ORDER — SUGAMMADEX SODIUM 200 MG/2ML IV SOLN
INTRAVENOUS | Status: DC | PRN
  Administered 2019-05-19: 200 mg via INTRAVENOUS

## 2019-05-19 MED ORDER — OXYCODONE HCL 5 MG PO TABS: 5.0000 mg | ORAL_TABLET | Freq: Once | ORAL | Status: DC | PRN

## 2019-05-19 MED ORDER — FAMOTIDINE 20 MG PO TABS
20.0000 mg | ORAL_TABLET | Freq: Once | ORAL | Status: AC
  Administered 2019-05-19: 20 mg via ORAL

## 2019-05-19 MED ORDER — ROCURONIUM BROMIDE 50 MG/5ML IV SOLN
INTRAVENOUS | Status: AC
  Filled 2019-05-19: qty 1

## 2019-05-19 MED ORDER — PHENYLEPHRINE HCL (PRESSORS) 10 MG/ML IV SOLN
INTRAVENOUS | Status: AC
  Filled 2019-05-19: qty 1

## 2019-05-19 MED ORDER — PROPOFOL 10 MG/ML IV BOLUS
INTRAVENOUS | Status: AC
  Filled 2019-05-19: qty 20

## 2019-05-19 MED ORDER — SODIUM CHLORIDE 0.9 % IR SOLN
Status: DC | PRN
  Administered 2019-05-19: 1000 mL

## 2019-05-19 MED ORDER — CHLORHEXIDINE GLUCONATE CLOTH 2 % EX PADS: 6.0000 | MEDICATED_PAD | Freq: Once | CUTANEOUS | Status: DC

## 2019-05-19 MED ORDER — GLYCOPYRROLATE 0.2 MG/ML IJ SOLN
INTRAMUSCULAR | Status: DC | PRN
  Administered 2019-05-19: 0.2 mg via INTRAVENOUS

## 2019-05-19 MED ORDER — DEXAMETHASONE SODIUM PHOSPHATE 10 MG/ML IJ SOLN
INTRAMUSCULAR | Status: AC
  Filled 2019-05-19: qty 1

## 2019-05-19 MED ORDER — SUGAMMADEX SODIUM 200 MG/2ML IV SOLN
INTRAVENOUS | Status: AC
  Filled 2019-05-19: qty 2

## 2019-05-19 MED ORDER — ONDANSETRON HCL 4 MG/2ML IJ SOLN
INTRAMUSCULAR | Status: AC
  Filled 2019-05-19: qty 2

## 2019-05-19 MED ORDER — IBUPROFEN 800 MG PO TABS
ORAL_TABLET | ORAL | Status: AC
  Filled 2019-05-19: qty 1

## 2019-05-19 MED ORDER — CELECOXIB 200 MG PO CAPS
200.0000 mg | ORAL_CAPSULE | ORAL | Status: AC
  Administered 2019-05-19: 200 mg via ORAL

## 2019-05-19 MED ORDER — IBUPROFEN 800 MG PO TABS: 800.0000 mg | ORAL_TABLET | Freq: Three times a day (TID) | ORAL | 0 refills | Status: DC | PRN

## 2019-05-19 MED ORDER — MIDAZOLAM HCL 2 MG/2ML IJ SOLN
INTRAMUSCULAR | Status: DC | PRN
  Administered 2019-05-19: 2 mg via INTRAVENOUS

## 2019-05-19 MED ORDER — HEMOSTATIC AGENTS (NO CHARGE) OPTIME
TOPICAL | Status: DC | PRN
  Administered 2019-05-19: 1 via TOPICAL

## 2019-05-19 MED ORDER — DEXAMETHASONE SODIUM PHOSPHATE 10 MG/ML IJ SOLN
INTRAMUSCULAR | Status: DC | PRN
  Administered 2019-05-19: 10 mg via INTRAVENOUS

## 2019-05-19 MED ORDER — IBUPROFEN 800 MG PO TABS
800.0000 mg | ORAL_TABLET | Freq: Three times a day (TID) | ORAL | Status: DC | PRN
  Administered 2019-05-19: 800 mg via ORAL
  Filled 2019-05-19: qty 1

## 2019-05-19 MED ORDER — FENTANYL CITRATE (PF) 100 MCG/2ML IJ SOLN
INTRAMUSCULAR | Status: AC
  Filled 2019-05-19: qty 2

## 2019-05-19 MED ORDER — ACETAMINOPHEN 500 MG PO TABS
1000.0000 mg | ORAL_TABLET | ORAL | Status: AC
  Administered 2019-05-19: 1000 mg via ORAL

## 2019-05-19 MED ORDER — DEXMEDETOMIDINE HCL IN NACL 200 MCG/50ML IV SOLN
INTRAVENOUS | Status: DC | PRN
  Administered 2019-05-19 (×2): 12 ug via INTRAVENOUS

## 2019-05-19 MED ORDER — 0.9 % SODIUM CHLORIDE (POUR BTL) OPTIME
TOPICAL | Status: DC | PRN
  Administered 2019-05-19: 500 mL

## 2019-05-19 MED ORDER — ONDANSETRON HCL 4 MG/2ML IJ SOLN
INTRAMUSCULAR | Status: DC | PRN
  Administered 2019-05-19: 4 mg via INTRAVENOUS

## 2019-05-19 MED ORDER — HYDROCODONE-ACETAMINOPHEN 5-325 MG PO TABS: 1.0000 | ORAL_TABLET | Freq: Four times a day (QID) | ORAL | 0 refills | Status: DC | PRN

## 2019-05-19 MED ORDER — LIDOCAINE-EPINEPHRINE 1 %-1:100000 IJ SOLN
INTRAMUSCULAR | Status: AC
  Filled 2019-05-19: qty 1

## 2019-05-19 MED ORDER — MIDAZOLAM HCL 2 MG/2ML IJ SOLN
INTRAMUSCULAR | Status: AC
  Filled 2019-05-19: qty 2

## 2019-05-19 MED ORDER — FENTANYL CITRATE (PF) 100 MCG/2ML IJ SOLN
INTRAMUSCULAR | Status: AC
  Administered 2019-05-19: 25 ug via INTRAVENOUS
  Filled 2019-05-19: qty 2

## 2019-05-19 MED ORDER — SUCCINYLCHOLINE CHLORIDE 20 MG/ML IJ SOLN
INTRAMUSCULAR | Status: AC
  Filled 2019-05-19: qty 1

## 2019-05-19 MED ORDER — SUCCINYLCHOLINE CHLORIDE 20 MG/ML IJ SOLN
INTRAMUSCULAR | Status: DC | PRN
  Administered 2019-05-19: 100 mg via INTRAVENOUS

## 2019-05-19 MED ORDER — FENTANYL CITRATE (PF) 100 MCG/2ML IJ SOLN
INTRAMUSCULAR | Status: DC | PRN
  Administered 2019-05-19 (×4): 50 ug via INTRAVENOUS

## 2019-05-19 MED ORDER — LIDOCAINE HCL (PF) 2 % IJ SOLN
INTRAMUSCULAR | Status: AC
  Filled 2019-05-19: qty 10

## 2019-05-19 MED ORDER — LIDOCAINE-EPINEPHRINE 1 %-1:100000 IJ SOLN
INTRAMUSCULAR | Status: DC | PRN
  Administered 2019-05-19: 19 mL

## 2019-05-19 MED ORDER — OXYCODONE HCL 5 MG/5ML PO SOLN: 5.0000 mg | Freq: Once | ORAL | Status: DC | PRN

## 2019-05-19 MED ORDER — SODIUM CHLORIDE 0.9 % IV SOLN
INTRAVENOUS | Status: DC
  Administered 2019-05-19: 07:00:00 via INTRAVENOUS

## 2019-05-19 MED ORDER — DEXMEDETOMIDINE HCL IN NACL 80 MCG/20ML IV SOLN
INTRAVENOUS | Status: AC
  Filled 2019-05-19: qty 20

## 2019-05-19 MED ORDER — ROCURONIUM BROMIDE 100 MG/10ML IV SOLN
INTRAVENOUS | Status: DC | PRN
  Administered 2019-05-19: 35 mg via INTRAVENOUS
  Administered 2019-05-19: 10 mg via INTRAVENOUS
  Administered 2019-05-19: 5 mg via INTRAVENOUS
  Administered 2019-05-19: 10 mg via INTRAVENOUS

## 2019-05-19 MED ORDER — GLYCOPYRROLATE 0.2 MG/ML IJ SOLN
INTRAMUSCULAR | Status: AC
  Filled 2019-05-19: qty 1

## 2019-05-19 MED ORDER — LIDOCAINE HCL (CARDIAC) PF 100 MG/5ML IV SOSY
PREFILLED_SYRINGE | INTRAVENOUS | Status: DC | PRN
  Administered 2019-05-19: 50 mg via INTRAVENOUS

## 2019-05-19 MED ORDER — PROPOFOL 10 MG/ML IV BOLUS
INTRAVENOUS | Status: DC | PRN
  Administered 2019-05-19: 150 mg via INTRAVENOUS

## 2019-05-19 SURGICAL SUPPLY — 45 items
APPLIER CLIP 5 13 M/L LIGAMAX5 (MISCELLANEOUS) ×2
BLADE SURG SZ11 CARB STEEL (BLADE) ×2 IMPLANT
CANISTER SUCT 1200ML W/VALVE (MISCELLANEOUS) ×2 IMPLANT
CHLORAPREP W/TINT 26 (MISCELLANEOUS) ×2 IMPLANT
CLIP APPLIE 5 13 M/L LIGAMAX5 (MISCELLANEOUS) ×1 IMPLANT
COVER WAND RF STERILE (DRAPES) ×2 IMPLANT
DECANTER SPIKE VIAL GLASS SM (MISCELLANEOUS) ×4 IMPLANT
DEFOGGER SCOPE WARMER CLEARIFY (MISCELLANEOUS) ×2 IMPLANT
DERMABOND ADVANCED (GAUZE/BANDAGES/DRESSINGS) ×1
DERMABOND ADVANCED .7 DNX12 (GAUZE/BANDAGES/DRESSINGS) ×1 IMPLANT
ELECT CAUTERY BLADE TIP 2.5 (TIP) ×2
ELECT REM PT RETURN 9FT ADLT (ELECTROSURGICAL) ×2
ELECTRODE CAUTERY BLDE TIP 2.5 (TIP) ×1 IMPLANT
ELECTRODE REM PT RTRN 9FT ADLT (ELECTROSURGICAL) ×1 IMPLANT
GLOVE BIO SURGEON STRL SZ 6.5 (GLOVE) ×2 IMPLANT
GLOVE INDICATOR 7.0 STRL GRN (GLOVE) ×4 IMPLANT
GOWN STRL REUS W/ TWL LRG LVL3 (GOWN DISPOSABLE) ×3 IMPLANT
GOWN STRL REUS W/TWL LRG LVL3 (GOWN DISPOSABLE) ×3
GRASPER SUT TROCAR 14GX15 (MISCELLANEOUS) IMPLANT
HEMOSTAT SURGICEL 2X3 (HEMOSTASIS) ×1 IMPLANT
IRRIGATION STRYKERFLOW (MISCELLANEOUS) ×1 IMPLANT
IRRIGATOR STRYKERFLOW (MISCELLANEOUS) ×2
IV NS 1000ML (IV SOLUTION) ×1
IV NS 1000ML BAXH (IV SOLUTION) ×1 IMPLANT
KIT TURNOVER KIT A (KITS) ×2 IMPLANT
LABEL OR SOLS (LABEL) ×2 IMPLANT
NEEDLE HYPO 22GX1.5 SAFETY (NEEDLE) ×2 IMPLANT
NS IRRIG 500ML POUR BTL (IV SOLUTION) ×2 IMPLANT
PACK LAP CHOLECYSTECTOMY (MISCELLANEOUS) ×2 IMPLANT
PENCIL ELECTRO HAND CTR (MISCELLANEOUS) ×2 IMPLANT
POUCH SPECIMEN RETRIEVAL 10MM (ENDOMECHANICALS) ×2 IMPLANT
SCISSORS METZENBAUM CVD 33 (INSTRUMENTS) ×2 IMPLANT
SET TUBE SMOKE EVAC HIGH FLOW (TUBING) ×2 IMPLANT
SLEEVE ADV FIXATION 5X100MM (TROCAR) ×4 IMPLANT
SOLUTION ELECTROLUBE (MISCELLANEOUS) ×2 IMPLANT
STRIP CLOSURE SKIN 1/2X4 (GAUZE/BANDAGES/DRESSINGS) ×2 IMPLANT
SUT MNCRL 4-0 (SUTURE) ×1
SUT MNCRL 4-0 27XMFL (SUTURE) ×1
SUT VIC AB 3-0 SH 27 (SUTURE) ×1
SUT VIC AB 3-0 SH 27X BRD (SUTURE) ×1 IMPLANT
SUT VICRYL 0 AB UR-6 (SUTURE) ×4 IMPLANT
SUTURE MNCRL 4-0 27XMF (SUTURE) ×1 IMPLANT
TROCAR ADV FIXATION 12X100MM (TROCAR) ×2 IMPLANT
TROCAR Z-THREAD OPTICAL 5X100M (TROCAR) ×2 IMPLANT
WATER STERILE IRR 1000ML POUR (IV SOLUTION) ×2 IMPLANT

## 2019-05-19 NOTE — Interval H&P Note (Signed)
History and Physical Interval Note:  05/19/2019 7:11 AM  Kristine Alexander  has presented today for surgery, with the diagnosis of GALLSTONES.  The various methods of treatment have been discussed with the patient and family. After consideration of risks, benefits and other options for treatment, the patient has consented to  Procedure(s): LAPAROSCOPIC CHOLECYSTECTOMY (N/A) as a surgical intervention.  The patient's history has been reviewed, patient examined, no change in status, stable for surgery.  I have reviewed the patient's chart and labs.  Questions were answered to the patient's satisfaction.     Fredirick Maudlin

## 2019-05-19 NOTE — Discharge Instructions (Signed)

## 2019-05-19 NOTE — Anesthesia Post-op Follow-up Note (Signed)
Anesthesia QCDR form completed.        

## 2019-05-19 NOTE — Transfer of Care (Signed)
Immediate Anesthesia Transfer of Care Note  Patient: Kristine Alexander  Procedure(s) Performed: LAPAROSCOPIC CHOLECYSTECTOMY (N/A Abdomen)  Patient Location: PACU  Anesthesia Type:General  Level of Consciousness: sedated  Airway & Oxygen Therapy: Patient Spontanous Breathing and Patient connected to face mask oxygen  Post-op Assessment: Report given to RN and Post -op Vital signs reviewed and stable  Post vital signs: Reviewed  Last Vitals:  Vitals Value Taken Time  BP 123/67 05/19/19 0950  Temp    Pulse 59 05/19/19 0950  Resp 17 05/19/19 0950  SpO2 100 % 05/19/19 0950  Vitals shown include unvalidated device data.  Last Pain:  Vitals:   05/19/19 0617  TempSrc: Oral  PainSc: 0-No pain         Complications: No apparent anesthesia complications

## 2019-05-19 NOTE — Anesthesia Procedure Notes (Signed)
Procedure Name: Intubation Performed by: Tashea Othman, CRNA Pre-anesthesia Checklist: Patient identified, Patient being monitored, Timeout performed, Emergency Drugs available and Suction available Patient Re-evaluated:Patient Re-evaluated prior to induction Oxygen Delivery Method: Circle system utilized Preoxygenation: Pre-oxygenation with 100% oxygen Induction Type: IV induction Ventilation: Mask ventilation without difficulty Laryngoscope Size: 3 and McGraph Grade View: Grade I Tube type: Oral Tube size: 7.0 mm Number of attempts: 1 Airway Equipment and Method: Stylet and Video-laryngoscopy Placement Confirmation: ETT inserted through vocal cords under direct vision,  positive ETCO2 and breath sounds checked- equal and bilateral Secured at: 21 cm Tube secured with: Tape Dental Injury: Teeth and Oropharynx as per pre-operative assessment        

## 2019-05-19 NOTE — Anesthesia Postprocedure Evaluation (Signed)
Anesthesia Post Note  Patient: Kristine Alexander  Procedure(s) Performed: LAPAROSCOPIC CHOLECYSTECTOMY (N/A Abdomen)  Patient location during evaluation: PACU Anesthesia Type: General Level of consciousness: awake and alert Pain management: pain level controlled Vital Signs Assessment: post-procedure vital signs reviewed and stable Respiratory status: spontaneous breathing, nonlabored ventilation, respiratory function stable and patient connected to nasal cannula oxygen Cardiovascular status: blood pressure returned to baseline and stable Postop Assessment: no apparent nausea or vomiting Anesthetic complications: no     Last Vitals:  Vitals:   05/19/19 1040 05/19/19 1047  BP:  124/90  Pulse: 66 (!) 57  Resp: 20 16  Temp:  (!) 36.2 C  SpO2: 98% 98%    Last Pain:  Vitals:   05/19/19 1040  TempSrc:   PainSc: 3                  Precious Haws Piscitello

## 2019-05-19 NOTE — Anesthesia Preprocedure Evaluation (Signed)
Anesthesia Evaluation  Patient identified by MRN, date of birth, ID band Patient awake    Reviewed: Allergy & Precautions, H&P , NPO status , Patient's Chart, lab work & pertinent test results  History of Anesthesia Complications Negative for: history of anesthetic complications  Airway Mallampati: III  TM Distance: <3 FB Neck ROM: limited    Dental  (+) Chipped, Poor Dentition, Missing   Pulmonary neg pulmonary ROS, neg shortness of breath,           Cardiovascular Exercise Tolerance: Good (-) angina(-) Past MI and (-) DOE      Neuro/Psych negative neurological ROS  negative psych ROS   GI/Hepatic negative GI ROS, Neg liver ROS, neg GERD  ,  Endo/Other  diabetes, Type 2  Renal/GU      Musculoskeletal  (+) Arthritis ,   Abdominal   Peds  Hematology negative hematology ROS (+)   Anesthesia Other Findings Past Medical History: No date: Diabetes mellitus without complication (HCC) No date: Hyperlipidemia No date: Iron deficiency anemia No date: Osteoarthritis of multiple joints 08/20/2017: Prediabetes  Past Surgical History: 02/25/2016: CATARACT EXTRACTION W/PHACO; Right     Comment:  Procedure: CATARACT EXTRACTION PHACO AND INTRAOCULAR               LENS PLACEMENT (IOC);  Surgeon: Estill Cotta, MD;                Location: [facility] ORS;  Service: Ophthalmology;  Laterality:              Right;  [place]    00:48.2 AP%   23.5 CDE   22.58 fluid               pack lot # [id number] 06/08/2018: CATARACT EXTRACTION W/PHACO; Left     Comment:  Procedure: CATARACT EXTRACTION PHACO AND INTRAOCULAR               LENS PLACEMENT (IOC);  Surgeon: Birder Robson, MD;                Location: [facility] ORS;  Service: Ophthalmology;  Laterality:              Left;  [place] 00:28.4 AP% 13.0 CDE 3.68 Fluid pack Lot #               [id number] No date: TUBAL LIGATION  BMI    Body Mass Index: 32.38 kg/m       Reproductive/Obstetrics negative OB ROS                             Anesthesia Physical Anesthesia Plan  ASA: III  Anesthesia Plan: General ETT   Post-op Pain Management:    Induction: Intravenous  PONV Risk Score and Plan: Ondansetron, Dexamethasone, Midazolam and Treatment may vary due to age or medical condition  Airway Management Planned: Oral ETT  Additional Equipment:   Intra-op Plan:   Post-operative Plan: Extubation in OR  Informed Consent: I have reviewed the patients History and Physical, chart, labs and discussed the procedure including the risks, benefits and alternatives for the proposed anesthesia with the patient or authorized representative who has indicated his/her understanding and acceptance.     Dental Advisory Given  Plan Discussed with: Anesthesiologist, CRNA and Surgeon  Anesthesia Plan Comments: (Patient and family consented for risks of anesthesia including but not limited to:  - adverse reactions to medications - damage to teeth, lips or other oral mucosa -  sore throat or hoarseness - Damage to heart, brain, lungs or loss of life  They voiced understanding.)        Anesthesia Quick Evaluation

## 2019-05-19 NOTE — Op Note (Signed)
Laparoscopic Cholecystectomy  Pre-operative Diagnosis: Symptomatic cholelithiasis  Post-operative Diagnosis: Same  Procedure: Laparoscopic cholecystectomy  Surgeon: Fredirick Maudlin, MD  Anesthesia: GETA  Assistant: None   Findings: Partially intrahepatic gallbladder, substantial fat surrounding the gallbladder and cystic duct.  No evidence of acute cholecystitis.  Estimated Blood Loss: Less than 10 cc         Drains: None         Specimens: Gallbladder           Complications: none   Procedure Details  The patient was seen again in the preoperative holding area. The benefits, complications, treatment options, and expected outcomes were discussed with the patient. The risks of bleeding, infection, recurrence of symptoms, failure to resolve symptoms, bile duct damage, bile duct leak, retained common bile duct stone, bowel injury, any of which could require further surgery and/or ERCP, stent, or papillotomy were reviewed with the patient. The likelihood of improving the patient's symptoms with return to their baseline status is good.  The patient and/or family concurred with the proposed plan, giving informed consent.  The patient was taken to operating room, identified as Kristine Alexander and the procedure verified as Laparoscopic Cholecystectomy. A time out was performed and the above information confirmed.  Prior to the induction of general anesthesia, antibiotic prophylaxis was administered. VTE prophylaxis was in place. General endotracheal anesthesia was then administered and tolerated well. After the induction, the abdomen was prepped with Chloraprep and draped in the sterile fashion. The patient was positioned in the supine position.  Optiview technique was used to enter the abdomen via a 5 mm port in the right upper quadrant.  Pneumoperitoneum was then created with CO2 and tolerated well without any adverse changes in the patient's vital signs.  A periumbilical 10 mm port and 2  additional right upper quadrant 5-mm ports were placed all under direct vision. All skin incisions  were infiltrated with a local anesthetic agent before making the incision and placing the trocars.   The patient was positioned  in reverse Trendelenburg, tilted slightly to the patient's left.  The gallbladder was identified, the fundus grasped and retracted cephalad. Adhesions were lysed bluntly. The infundibulum was grasped and retracted laterally, exposing the peritoneum overlying the triangle of Calot. This was then divided and exposed in a blunt fashion. An extended critical view of the cystic duct and cystic artery was obtained.  The cystic duct was clearly identified and bluntly dissected free. Both the cystic artery and duct were double clipped and divided.  The gallbladder was taken from the gallbladder fossa in a retrograde fashion with the electrocautery. The gallbladder was removed and placed in an Endo pouch bag. The liver bed was irrigated and inspected. Hemostasis was achieved with the electrocautery. Copious saline irrigation was utilized and was repeatedly aspirated until clear.  The gallbladder and Endo pouch sac were then removed through a port site.   Inspection of the right upper quadrant was performed. No bleeding, bile duct injury or leak, or bowel injury was noted. Pneumoperitoneum was released.  The periumbilical port site was closed with interrumpted 0 Vicryl sutures. 4-0 subcuticular Monocryl was used to close the skin. Dermabond was applied, followed by Steri-Strips..  The patient was then extubated and brought to the recovery room in stable condition. Sponge, lap, and needle counts were correct at closure and at the conclusion of the case.               Fredirick Maudlin, MD, FACS

## 2019-05-19 NOTE — OR Nursing (Signed)
Daughter present for entire preop admission to assist with communication.  Explanation of what will be expected of her in the OR.  No questions.  Daughter available per cell phone.

## 2019-05-23 LAB — SURGICAL PATHOLOGY

## 2019-05-30 ENCOUNTER — Telehealth: Payer: Self-pay | Admitting: *Deleted

## 2019-05-30 NOTE — Telephone Encounter (Signed)
Called patient back she is having shortness of breath that started yesterday 05/29/2019, patient had a fever off 100 that started on Friday. The incision site is red and hot to touch, she thinks that it looks infected. I talked with a nurse and we both decided that it would be best to go to the emergency department, I advise patient to go to the emergency department and she is going to go today and then give [place] a call back in the morning to give [place] an update.

## 2019-05-30 NOTE — Telephone Encounter (Signed)
Patient called and stated that she is having pain on her right side, it started yesterday, and today she stated that its making her hard to breathe. Please call and advise

## 2019-05-31 ENCOUNTER — Telehealth: Payer: Self-pay | Admitting: *Deleted

## 2019-05-31 ENCOUNTER — Emergency Department: Payer: BLUE CROSS/BLUE SHIELD

## 2019-05-31 ENCOUNTER — Encounter: Payer: Self-pay | Admitting: Emergency Medicine

## 2019-05-31 ENCOUNTER — Other Ambulatory Visit: Payer: Self-pay

## 2019-05-31 ENCOUNTER — Emergency Department
Admission: EM | Admit: 2019-05-31 | Discharge: 2019-05-31 | Disposition: A | Discharge status: Home / Self Care | Payer: BLUE CROSS/BLUE SHIELD | Attending: Emergency Medicine | Admitting: Emergency Medicine

## 2019-05-31 DIAGNOSIS — Z79899 Other long term (current) drug therapy: Secondary | ICD-10-CM | POA: Diagnosis not present

## 2019-05-31 DIAGNOSIS — Z7984 Long term (current) use of oral hypoglycemic drugs: Secondary | ICD-10-CM | POA: Diagnosis not present

## 2019-05-31 DIAGNOSIS — R1011 Right upper quadrant pain: Secondary | ICD-10-CM | POA: Insufficient documentation

## 2019-05-31 DIAGNOSIS — Z20828 Contact with and (suspected) exposure to other viral communicable diseases: Secondary | ICD-10-CM | POA: Insufficient documentation

## 2019-05-31 DIAGNOSIS — E119 Type 2 diabetes mellitus without complications: Secondary | ICD-10-CM | POA: Diagnosis not present

## 2019-05-31 LAB — COMPREHENSIVE METABOLIC PANEL
ALT: 31 U/L (ref 0–44)
AST: 27 U/L (ref 15–41)
Albumin: 3.6 g/dL (ref 3.5–5.0)
Alkaline Phosphatase: 89 U/L (ref 38–126)
Anion gap: 10 (ref 5–15)
BUN: 13 mg/dL (ref 8–23)
CO2: 24 mmol/L (ref 22–32)
Calcium: 9 mg/dL (ref 8.9–10.3)
Chloride: 104 mmol/L (ref 98–111)
Creatinine, Ser: 0.53 mg/dL (ref 0.44–1.00)
GFR calc Af Amer: >60 mL/min (ref >60)
GFR calc non Af Amer: >60 mL/min (ref >60)
Glucose, Bld: 121 mg/dL — ABNORMAL HIGH (ref 70–99)
Potassium: 3.5 mmol/L (ref 3.5–5.1)
Sodium: 138 mmol/L (ref 135–145)
Total Bilirubin: 0.4 mg/dL (ref 0.3–1.2)
Total Protein: 7.9 g/dL (ref 6.5–8.1)

## 2019-05-31 LAB — CBC WITH DIFFERENTIAL/PLATELET
Abs Immature Granulocytes: 0.04 10*3/uL (ref 0.00–0.07)
Basophils Absolute: 0 10*3/uL (ref 0.0–0.1)
Basophils Relative: 0 %
Eosinophils Absolute: 0.4 10*3/uL (ref 0.0–0.5)
Eosinophils Relative: 4 %
HCT: 37.8 % (ref 36.0–46.0)
Hemoglobin: 11.9 g/dL — ABNORMAL LOW (ref 12.0–15.0)
Immature Granulocytes: 0 %
Lymphocytes Relative: 22 %
Lymphs Abs: 2.3 10*3/uL (ref 0.7–4.0)
MCH: 24.9 pg — ABNORMAL LOW (ref 26.0–34.0)
MCHC: 31.5 g/dL (ref 30.0–36.0)
MCV: 79.2 fL — ABNORMAL LOW (ref 80.0–100.0)
Monocytes Absolute: 0.8 10*3/uL (ref 0.1–1.0)
Monocytes Relative: 8 %
Neutro Abs: 6.9 10*3/uL (ref 1.7–7.7)
Neutrophils Relative %: 66 %
Platelets: 324 10*3/uL (ref 150–400)
RBC: 4.77 MIL/uL (ref 3.87–5.11)
RDW: 14.1 % (ref 11.5–15.5)
WBC: 10.5 10*3/uL (ref 4.0–10.5)
nRBC: 0 % (ref 0.0–0.2)

## 2019-05-31 LAB — PROTIME-INR
INR: 1.1 (ref 0.8–1.2)
Prothrombin Time: 13.6 seconds (ref 11.4–15.2)

## 2019-05-31 LAB — APTT: aPTT: 27 seconds (ref 24–36)

## 2019-05-31 LAB — SARS CORONAVIRUS 2 BY RT PCR (HOSPITAL ORDER, PERFORMED IN ~~LOC~~ HOSPITAL LAB): SARS Coronavirus 2: NEGATIVE

## 2019-05-31 LAB — TROPONIN I (HIGH SENSITIVITY): Troponin I (High Sensitivity): 3 ng/L (ref <18)

## 2019-05-31 LAB — LIPASE, BLOOD: Lipase: 35 U/L (ref 11–51)

## 2019-05-31 MED ORDER — FENTANYL CITRATE (PF) 100 MCG/2ML IJ SOLN
50.0000 ug | Freq: Once | INTRAMUSCULAR | Status: AC
  Administered 2019-05-31: 50 ug via INTRAVENOUS
  Filled 2019-05-31: qty 2

## 2019-05-31 MED ORDER — IOHEXOL 350 MG/ML SOLN
75.0000 mL | Freq: Once | INTRAVENOUS | Status: AC | PRN
  Administered 2019-05-31: 12:00:00 75 mL via INTRAVENOUS

## 2019-05-31 NOTE — ED Triage Notes (Signed)
Arrives from [facility] for ED evaluation of abdominal pain.  Patient had gall bladder removed 10 days ago and d/o RUQ abdominal pain with inspiration.  Patient is AAOx3.  Skin warm and dry. NAD

## 2019-05-31 NOTE — ED Provider Notes (Signed)
[facility] Emergency Department Provider Note  ____________________________________________   First MD Initiated Contact with Patient 05/31/19 [phone]     (approximate)  I have reviewed the triage vital signs and the nursing notes.   HISTORY  Chief Complaint Abdominal Pain    HPI Kristine Alexander is a 63 y.o. female with diabetes, hyperlipidemia who presents for abdominal pain.  Patient is status post a laparoscopic cholecystectomy on 7/2.  Patient was initially doing well but has developed worsening right upper quadrant pain that comes on with deep breathing.  The pain only happens with breathing.  It is moderate, intermittent, nothing makes it better nothing makes it worse.  Patient did have a low grade fever of 100 a few days ago but nothing recently. NO dysuria.            Past Medical History:  Diagnosis Date   Diabetes mellitus without complication (HCC)    Hyperlipidemia    Iron deficiency anemia    Osteoarthritis of multiple joints    Prediabetes 08/20/2017    Patient Active Problem List   Diagnosis Date Noted   Hepatic steatosis 03/22/2019   Cholelithiases 03/22/2019   'light-for-dates' infant with signs of fetal malnutrition 02/21/2019   Allergic conjunctivitis of both eyes 02/18/2018   Medication monitoring encounter 12/22/2017   Prediabetes 08/20/2017   Microcytic anemia 08/20/2017   Vitamin B12 deficiency 08/20/2017   Numbness 08/18/2017   Microcytosis 03/01/2017   SOB (shortness of breath) on exertion 10/03/2015   Pedal edema 10/02/2015   Heart murmur 09/18/2015   Obesity, Class I, BMI 30-34.9 05/16/2015   Osteoarthrosis, generalized, multiple joints 05/16/2015   History of fall 05/16/2015   Hyperlipidemia LDL goal <100 05/16/2015    Past Surgical History:  Procedure Laterality Date   CATARACT EXTRACTION W/PHACO Right 02/25/2016   Procedure: CATARACT EXTRACTION PHACO AND INTRAOCULAR LENS PLACEMENT  (IOC);  Surgeon: Sallee Lange, MD;  Location: [facility] ORS;  Service: Ophthalmology;  Laterality: Right;  [place]    00:48.2 AP%   23.5 CDE   22.58 fluid pack lot # [id number]   CATARACT EXTRACTION W/PHACO Left 06/08/2018   Procedure: CATARACT EXTRACTION PHACO AND INTRAOCULAR LENS PLACEMENT (IOC);  Surgeon: Galen Manila, MD;  Location: [facility] ORS;  Service: Ophthalmology;  Laterality: Left;  [place] 00:28.4 AP% 13.0 CDE 3.68 Fluid pack Lot # [id number]   CHOLECYSTECTOMY N/A 05/19/2019   Procedure: LAPAROSCOPIC CHOLECYSTECTOMY;  Surgeon: Duanne Guess, MD;  Location: [facility] ORS;  Service: General;  Laterality: N/A;   TUBAL LIGATION      Prior to Admission medications   Medication Sig Start Date End Date Taking? Authorizing Provider  atorvastatin (LIPITOR) 10 MG tablet TAKE 1 TABLET(10 MG) BY MOUTH AT BEDTIME 02/05/19   Lada, Janit Bern, MD  famotidine (PEPCID) 20 MG tablet Take 1 tablet (20 mg total) by mouth 2 (two) times daily. 02/21/19   Poulose, Percell Belt, NP  HYDROcodone-acetaminophen (NORCO/VICODIN) 5-325 MG tablet Take 1 tablet by mouth every 6 (six) hours as needed for moderate pain. 05/19/19   Duanne Guess, MD  ibuprofen (ADVIL) 800 MG tablet Take 1 tablet (800 mg total) by mouth every 8 (eight) hours as needed. 05/19/19   Duanne Guess, MD  metFORMIN (GLUCOPHAGE-XR) 500 MG 24 hr tablet TAKE 2 TABLETS(1000 MG) BY MOUTH DAILY 04/28/19   Doren Custard, FNP    Allergies Patient has no known allergies.  Family History  Problem Relation Age of Onset   Hyperlipidemia Mother    Hyperlipidemia Father  Hyperlipidemia Sister    Diabetes Brother    Hyperlipidemia Brother     Social History Social History   Tobacco Use   Smoking status: Never Smoker   Smokeless tobacco: Never Used  Substance Use Topics   Alcohol use: No    Alcohol/week: 0.0 standard drinks   Drug use: No      Review of Systems Constitutional: No fever/chills Eyes: No visual changes. ENT: No sore  throat. Cardiovascular: Denies chest pain. Respiratory: Pain with breathing. Gastrointestinal: Right upper quadrant pain.  No nausea, no vomiting.  No diarrhea.  No constipation. Genitourinary: Negative for dysuria. Musculoskeletal: Negative for back pain. Skin: Negative for rash. Neurological: Negative for headaches, focal weakness or numbness. All other ROS negative ____________________________________________   PHYSICAL EXAM:  VITAL SIGNS: ED Triage Vitals  Enc Vitals Group     BP --      Pulse --      Resp --      Temp --      Temp src --      SpO2 --      Weight 05/31/19 0941 165 lb 12.6 oz (75.2 kg)     Height --      Head Circumference --      Peak Flow --      Pain Score 05/31/19 0940 8     Pain Loc --      Pain Edu? --      Excl. in GC? --    Blood pressure 115/61, pulse 66, temperature 98.8 F (37.1 C), temperature source Oral, resp. rate 20, weight 75.2 kg, SpO2 95 %.    Constitutional: Alert and oriented. Well appearing and in no acute distress. Eyes: Conjunctivae are normal. EOMI. Head: Atraumatic. Nose: No congestion/rhinnorhea. Mouth/Throat: Mucous membranes are moist.   Neck: No stridor. Trachea Midline. FROM Cardiovascular: Normal rate, regular rhythm. Grossly normal heart sounds.  Good peripheral circulation. Respiratory: Normal respiratory effort.  No retractions. Lungs CTAB. Gastrointestinal: Soft and nontender. No distention. No abdominal bruits.  Well-healing laparoscopy scars.  Pain in the right upper quadrant Musculoskeletal: No lower extremity tenderness nor edema.  No joint effusions. Neurologic:  Normal speech and language. No gross focal neurologic deficits are appreciated.  Skin:  Skin is warm, dry and intact. No rash noted. Psychiatric: Mood and affect are normal. Speech and behavior are normal. GU: Deferred   ____________________________________________   LABS (all labs ordered are listed, but only abnormal results are  displayed)  Labs Reviewed  CBC WITH DIFFERENTIAL/PLATELET - Abnormal; Notable for the following components:      Result Value   Hemoglobin 11.9 (*)    MCV 79.2 (*)    MCH 24.9 (*)    All other components within normal limits  COMPREHENSIVE METABOLIC PANEL - Abnormal; Notable for the following components:   Glucose, Bld 121 (*)    All other components within normal limits  SARS CORONAVIRUS 2 (HOSPITAL ORDER, PERFORMED IN [facility])  LIPASE, BLOOD  PROTIME-INR  APTT  URINALYSIS, ROUTINE W REFLEX MICROSCOPIC  TROPONIN I (HIGH SENSITIVITY)   ____________________________________________   ED ECG REPORT I, Concha SeMary E Lavana Huckeba, the attending physician, personally viewed and interpreted this ECG.  EKG normal sinus rate of 71, no ST elevation, no T wave inversion, normal intervals left axis deviation. ____________________________________________  RADIOLOGY   Official radiology report(s): Ct Angio Chest Pe W And/or Wo Contrast  Result Date: 05/31/2019 CLINICAL DATA:  63 year old with right upper quadrant pain. Recent cholecystectomy. EXAM: CT  ANGIOGRAPHY CHEST CT ABDOMEN AND PELVIS WITH CONTRAST TECHNIQUE: Multidetector CT imaging of the chest was performed using the standard protocol during bolus administration of intravenous contrast. Multiplanar CT image reconstructions and MIPs were obtained to evaluate the vascular anatomy. Multidetector CT imaging of the abdomen and pelvis was performed using the standard protocol during bolus administration of intravenous contrast. CONTRAST:  75mL OMNIPAQUE IOHEXOL 350 MG/ML SOLN COMPARISON:  None. FINDINGS: CTA CHEST FINDINGS Cardiovascular: Negative for pulmonary embolism. Normal caliber of the thoracic aorta without evidence of dissection. Mild atherosclerotic calcifications in the thoracic aorta. Great vessels are patent. Mediastinum/Nodes: Prominent subcarinal nodal tissue measuring 1.1 cm in the short axis. Mildly prominent right hilar  tissue measuring 0.8 cm on sequence 4, image 48. Small prevascular lymph nodes. Mildly prominent left hilar nodes. Small bilateral axillary lymph nodes. Visualized thyroid tissue is unremarkable. Normal appearance of the esophagus. Lungs/Pleura: Trachea and mainstem bronchi are patent. Irregular nodular structures along the posterior periphery of the right upper lobe. Total of 3 small nodular pleural-based opacities along the posterior right lower lobe best seen on the sagittal reformats, sequence 8, image 81 and image 76. Largest nodule is on sequence 6, image 24 and measures 0.6 x 0.5 x 0.8 cm. There is another small parenchymal nodular density along the posterior right lower lobe on image 23, sequence 6 measuring roughly 0.5 cm. No pleural effusions. Left lung is clear. Musculoskeletal: No acute bone abnormality. Review of the MIP images confirms the above findings. CT ABDOMEN and PELVIS FINDINGS Hepatobiliary: Cholecystectomy. Small amount of fluid in the cholecystectomy bed. Poorly defined low-density area or fluid in the cholecystectomy bed measures 3.3 x 1.6 x 1.4 cm. Mild inflammatory changes at the cholecystectomy bed. Otherwise, normal appearance of the liver. No suspicious liver lesions. The portal venous system is patent. Pancreas: Unremarkable. No pancreatic ductal dilatation or surrounding inflammatory changes. Spleen: Normal in size without focal abnormality. Adrenals/Urinary Tract: Normal adrenal glands. Normal appearance of both kidneys without hydronephrosis. Small amount of fluid in the urinary bladder. Normal appearance of the urinary bladder. Stomach/Bowel: Normal appearance of the stomach and duodenum. Inflammatory changes along the hepatic flexure adjacent to the liver and cholecystectomy bed. No evidence for bowel obstruction. There appears to be a normal appendix but it is small and not well visualized. Vascular/Lymphatic: Atherosclerotic calcifications in abdominal aorta without aneurysm.  The main visceral arteries are patent. Small retroperitoneal lymph node between the aorta and IVC on sequence 11, image 33 measures 0.9 cm. Overall, no significant lymph node enlargement in the abdomen or pelvis. Reproductive: Uterus and bilateral adnexa are unremarkable. Other: No free fluid in the pelvis. Negative for free air. Small amount of stranding or trace fluid along the inferior aspect of the right hepatic lobe. Musculoskeletal: Bilateral pars defects at L4 with 6 mm of anterolisthesis at L4-L5. Mild disc space narrowing at L4-L5. Posterior disc space narrowing at L5-S1. Review of the MIP images confirms the above findings. IMPRESSION: 1. Postoperative changes from a recent cholecystectomy. Small amount of fluid in the cholecystectomy bed likely represents expected postoperative changes. No other significant fluid in the abdomen or pelvis to suggest a biliary leak. 2. Mild inflammatory changes around the cholecystectomy bed and right hepatic flexure region. 3. Negative for pulmonary embolism.  No acute chest abnormality. 4. **An incidental finding of potential clinical significance has been found. Nodular structures along the posterior aspect of the right upper lobe. Findings could represent post infectious or post inflammatory changes but indeterminate and no comparison images  to evaluate stability or chronicity. Mild mediastinal and hilar lymphadenopathy of uncertain etiology and recommend attention on follow up imaging. Non-contrast chest CT at 3-6 months is recommended. If the nodules are stable at time of repeat CT, then future CT at 18-24 months (from today's scan) is considered optional for low-risk patients, but is recommended for high-risk patients. This recommendation follows the consensus statement: Guidelines for Management of Incidental Pulmonary Nodules Detected on CT Images: From the Fleischner Society 2017; Radiology 2017; 284:228-243.** 5. 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 Electronically Signed   By: Richarda OverlieAdam  Henn M.D.   On: 05/31/2019 13:15   Ct Abdomen Pelvis W Contrast  Result Date: 05/31/2019 CLINICAL DATA:  63 year old with right upper quadrant pain. Recent cholecystectomy. EXAM: CT ANGIOGRAPHY CHEST CT ABDOMEN AND PELVIS WITH CONTRAST TECHNIQUE: Multidetector CT imaging of the chest was performed using the standard protocol during bolus administration of intravenous contrast. Multiplanar CT image reconstructions and MIPs were obtained to evaluate the vascular anatomy. Multidetector CT imaging of the abdomen and pelvis was performed using the standard protocol during bolus administration of intravenous contrast. CONTRAST:  75mL OMNIPAQUE IOHEXOL 350 MG/ML SOLN COMPARISON:  None. FINDINGS: CTA CHEST FINDINGS Cardiovascular: Negative for pulmonary embolism. Normal caliber of the thoracic aorta without evidence of dissection. Mild atherosclerotic calcifications in the thoracic aorta. Great vessels are patent. Mediastinum/Nodes: Prominent subcarinal nodal tissue measuring 1.1 cm in the short axis. Mildly prominent right hilar tissue measuring 0.8 cm on sequence 4, image 48. Small prevascular lymph nodes. Mildly prominent left hilar nodes. Small bilateral axillary lymph nodes. Visualized thyroid tissue is unremarkable. Normal appearance of the esophagus. Lungs/Pleura: Trachea and mainstem bronchi are patent. Irregular nodular structures along the posterior periphery of the right upper lobe. Total of 3 small nodular pleural-based opacities along the posterior right lower lobe best seen on the sagittal reformats, sequence 8, image 81 and image 76. Largest nodule is on sequence 6, image 24 and measures 0.6 x 0.5 x 0.8 cm. There is another small parenchymal nodular density along the posterior right lower lobe on image 23, sequence 6 measuring roughly 0.5 cm. No pleural effusions. Left lung is clear. Musculoskeletal: No acute bone abnormality. Review of the MIP images confirms  the above findings. CT ABDOMEN and PELVIS FINDINGS Hepatobiliary: Cholecystectomy. Small amount of fluid in the cholecystectomy bed. Poorly defined low-density area or fluid in the cholecystectomy bed measures 3.3 x 1.6 x 1.4 cm. Mild inflammatory changes at the cholecystectomy bed. Otherwise, normal appearance of the liver. No suspicious liver lesions. The portal venous system is patent. Pancreas: Unremarkable. No pancreatic ductal dilatation or surrounding inflammatory changes. Spleen: Normal in size without focal abnormality. Adrenals/Urinary Tract: Normal adrenal glands. Normal appearance of both kidneys without hydronephrosis. Small amount of fluid in the urinary bladder. Normal appearance of the urinary bladder. Stomach/Bowel: Normal appearance of the stomach and duodenum. Inflammatory changes along the hepatic flexure adjacent to the liver and cholecystectomy bed. No evidence for bowel obstruction. There appears to be a normal appendix but it is small and not well visualized. Vascular/Lymphatic: Atherosclerotic calcifications in abdominal aorta without aneurysm. The main visceral arteries are patent. Small retroperitoneal lymph node between the aorta and IVC on sequence 11, image 33 measures 0.9 cm. Overall, no significant lymph node enlargement in the abdomen or pelvis. Reproductive: Uterus and bilateral adnexa are unremarkable. Other: No free fluid in the pelvis. Negative for free air. Small amount of stranding or trace fluid along the inferior aspect of the  right hepatic lobe. Musculoskeletal: Bilateral pars defects at L4 with 6 mm of anterolisthesis at L4-L5. Mild disc space narrowing at L4-L5. Posterior disc space narrowing at L5-S1. Review of the MIP images confirms the above findings. IMPRESSION: 1. Postoperative changes from a recent cholecystectomy. Small amount of fluid in the cholecystectomy bed likely represents expected postoperative changes. No other significant fluid in the abdomen or pelvis to  suggest a biliary leak. 2. Mild inflammatory changes around the cholecystectomy bed and right hepatic flexure region. 3. Negative for pulmonary embolism.  No acute chest abnormality. 4. **An incidental finding of potential clinical significance has been found. Nodular structures along the posterior aspect of the right upper lobe. Findings could represent post infectious or post inflammatory changes but indeterminate and no comparison images to evaluate stability or chronicity. Mild mediastinal and hilar lymphadenopathy of uncertain etiology and recommend attention on follow up imaging. Non-contrast chest CT at 3-6 months is recommended. If the nodules are stable at time of repeat CT, then future CT at 18-24 months (from today's scan) is considered optional for low-risk patients, but is recommended for high-risk patients. This recommendation follows the consensus statement: Guidelines for Management of Incidental Pulmonary Nodules Detected on CT Images: From the Fleischner Society 2017; Radiology 2017; 284:228-243.** 5. 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 Electronically Signed   By: Markus Daft M.D.   On: 05/31/2019 13:15    ____________________________________________   PROCEDURES  Procedure(s) performed (including Critical Care):  Procedures   ____________________________________________   INITIAL IMPRESSION / ASSESSMENT AND PLAN / ED COURSE  Graycie Deremer was evaluated in Emergency Department on 05/31/2019 for the symptoms described in the history of present illness. She was evaluated in the context of the global COVID-19 pandemic, which necessitated consideration that the patient might be at risk for infection with the SARS-CoV-2 virus that causes COVID-19. Institutional protocols and algorithms that pertain to the evaluation of patients at risk for COVID-19 are in a state of rapid change based on information released by regulatory bodies including the CDC and federal and  state organizations. These policies and algorithms were followed during the patient's care in the ED.    Patients vital signs are stable. Given patient is postop with pleuritic chest pain will get CT PE to evaluate for pulmonary embolism.  Will also get CT abdomen to evaluate for signs of postop complication such as abscess/ductal dilation to suggest retained stone.  Will also get cardiac markers and EKG to evaluate for ACS although my suspicion is less.  Will get LFTs to evaluate for retained stone.  Will get urine to evaluate for UTI.     11:50 AM reviewed patient's labs and they are reassuring.  Patient declining CT scan at this time. Saying pain resolved and wanted to f/u outpatient.  Given pts initially presentation Used the [employer] interpretor video to re-discuss w/ patient and patient then agreeable to CT scans.   1:40 PM reevaluated patient.  Reviewed imaging.  Discussed with the surgery team and patient has follow-up next week.  Patient will be discharged home at this time. Pt denies any pain currently.  ____________________________________________   FINAL CLINICAL IMPRESSION(S) / ED DIAGNOSES   Final diagnoses:  Right upper quadrant abdominal pain      MEDICATIONS GIVEN DURING THIS VISIT:  Medications  fentaNYL (SUBLIMAZE) injection 50 mcg (50 mcg Intravenous Given 05/31/19 1029)  iohexol (OMNIPAQUE) 350 MG/ML injection 75 mL (75 mLs Intravenous Contrast Given 05/31/19 1222)     ED  Discharge Orders    None       Note:  This document was prepared using Dragon voice recognition software and may include unintentional dictation errors.   Concha SeFunke, Ninah Moccio E, MD 05/31/19 [phone]

## 2019-05-31 NOTE — ED Notes (Signed)
Pt not wanting CT scan. Dr Jari Pigg notified.  Went over reasons for scan and risks of not having. Pt and family verbalized understanding.

## 2019-05-31 NOTE — Telephone Encounter (Signed)
Called patient back and advise her that she really needs to go to the emergency room since she is still having shortness of breath or to call her primary care physician to see if she can be seen today

## 2019-05-31 NOTE — Telephone Encounter (Signed)
Patient called back and stated that she didn't go to the emergency and wants to be seen in the office today, she is still having the same symptoms. Please call and advise

## 2019-06-03 ENCOUNTER — Ambulatory Visit (INDEPENDENT_AMBULATORY_CARE_PROVIDER_SITE_OTHER): Payer: BLUE CROSS/BLUE SHIELD | Admitting: Physician Assistant

## 2019-06-03 ENCOUNTER — Encounter: Payer: Self-pay | Admitting: Physician Assistant

## 2019-06-03 ENCOUNTER — Other Ambulatory Visit: Payer: Self-pay

## 2019-06-03 VITALS — BP 124/70 | HR 68 | Temp 97.9°F | Resp 15 | Wt 164.6 lb

## 2019-06-03 DIAGNOSIS — Z09 Encounter for follow-up examination after completed treatment for conditions other than malignant neoplasm: Secondary | ICD-10-CM

## 2019-06-03 DIAGNOSIS — K8020 Calculus of gallbladder without cholecystitis without obstruction: Secondary | ICD-10-CM

## 2019-06-03 NOTE — Patient Instructions (Signed)
May continue to take Ibuprofen daily, may take up to 800mg  every 6 hours.  Use heating pad to the area for healing and comfort If the pain does not improve in the next two weeks call us for a return visit.

## 2019-06-03 NOTE — Progress Notes (Signed)
[facility] POST-OP OFFICE VISIT  06/03/2019  HPI: Kristine Alexander is a 63 y.o. female 15 days s/p laparoscopic cholecystectomy for symptomatic cholelithiasis. She presented to the ED on 07/14 for RUQ abdominal pain worse with deep inspiration. She was worked up extensively for this which revealed no leukocytosis, normal LFT/Bilirubin, and normal lipase. Imaging studies with CT revealed inflammatory and post-operative changes, no evidence of biliary leak. She was discharged with close surgical follow up.   Today, she presents with her daughter who help interprets. She reports RUQ soreness worse with deep inspiration,but this is slowly improving. No fever, chills, nausea, or emesis at home. She is tolerating a diet without issue. No loose stools. No other complaints.     Vital signs: There were no vitals taken for this visit.   Physical Exam: Constitutional: Well appearing female, NAD HEENT: No scleral Icterus Abdomen: Soft, mild incisional soreness, non-distended, no rebound/guarding Skin: Laparoscopic incisions are well healed, no erythema or drainage   Lab Results from ED reviewed:  WBC - 10.5 Hgb - 11.9 Ast - 27 Alt - 31 T. Bili - 0.4 Lipase - 35   Imaging Studies Reviewed:  CT Chest/Abdomen/[place] (05/31/2019) personally reviewed and radiologist report reviewed:  IMPRESSION: 1. Postoperative changes from a recent cholecystectomy. Small amount of fluid in the cholecystectomy bed likely represents expected postoperative changes. No other significant fluid in the abdomen or pelvis to suggest a biliary leak. 2. Mild inflammatory changes around the cholecystectomy bed and right hepatic flexure region. 3. Negative for pulmonary embolism.  No acute chest abnormality. 4. Incidental RUL nodular structures   Assessment/Plan: This is a 63 y.o. female 5 days s/p laparoscopic cholecystectomy for symptomatic cholelithiasis.    - Continue Ibuprofen +/- Ice for pain  control, up to 800 mg q6  - Okay to submerge wound  - 2 more weeks of no heavy lifting  - Reviewed pathology with patient and daughter: Chronic cholecystitis + cholelithiasis  - RTC PRN, or sooner if pain fails to improve  This was discussed with patient and her daughter who helped interpret. All of their questions and concerns were addressed and answered. They voiced understanding of work up and POC at end of visit.   -- Edison Simon, PA-C [facility] 06/03/2019, 10:01 AM [phone] M-F: 7am - 4pm

## 2019-06-08 ENCOUNTER — Encounter: Payer: BLUE CROSS/BLUE SHIELD | Admitting: General Surgery

## 2019-06-08 ENCOUNTER — Other Ambulatory Visit: Payer: Self-pay | Admitting: Family Medicine

## 2019-08-08 ENCOUNTER — Telehealth: Payer: Self-pay

## 2019-08-08 MED ORDER — ATORVASTATIN CALCIUM 10 MG PO TABS: ORAL_TABLET | ORAL | 0 refills | Status: DC

## 2019-08-08 NOTE — Telephone Encounter (Signed)
Called to schedule and pts phone just rang and then it hung up. No option for VM

## 2019-08-10 ENCOUNTER — Other Ambulatory Visit: Payer: Self-pay

## 2019-08-11 ENCOUNTER — Telehealth: Payer: Self-pay

## 2019-08-11 MED ORDER — ATORVASTATIN CALCIUM 10 MG PO TABS: ORAL_TABLET | ORAL | 0 refills | Status: DC

## 2019-08-11 NOTE — Telephone Encounter (Signed)
Refill request was sent to Emily Boyce, FNP for approval and submission.  

## 2019-08-11 NOTE — Telephone Encounter (Signed)
Called to schedule pts appt and phone just rang no option for VM

## 2019-08-11 NOTE — Telephone Encounter (Signed)
Please schedule patient for follow up in the next 15 days.  

## 2019-08-17 ENCOUNTER — Encounter: Payer: Self-pay | Admitting: Family Medicine

## 2019-08-17 ENCOUNTER — Other Ambulatory Visit: Payer: Self-pay

## 2019-08-17 ENCOUNTER — Ambulatory Visit (INDEPENDENT_AMBULATORY_CARE_PROVIDER_SITE_OTHER): Payer: BLUE CROSS/BLUE SHIELD | Admitting: Family Medicine

## 2019-08-17 VITALS — BP 128/68 | HR 94 | Temp 98.1°F | Resp 14 | Ht 60.0 in | Wt 169.3 lb

## 2019-08-17 DIAGNOSIS — Z6833 Body mass index (BMI) 33.0-33.9, adult: Secondary | ICD-10-CM

## 2019-08-17 DIAGNOSIS — E785 Hyperlipidemia, unspecified: Secondary | ICD-10-CM

## 2019-08-17 DIAGNOSIS — Z5181 Encounter for therapeutic drug level monitoring: Secondary | ICD-10-CM

## 2019-08-17 DIAGNOSIS — D649 Anemia, unspecified: Secondary | ICD-10-CM

## 2019-08-17 DIAGNOSIS — I358 Other nonrheumatic aortic valve disorders: Secondary | ICD-10-CM | POA: Insufficient documentation

## 2019-08-17 DIAGNOSIS — E1169 Type 2 diabetes mellitus with other specified complication: Secondary | ICD-10-CM | POA: Insufficient documentation

## 2019-08-17 DIAGNOSIS — I700 Atherosclerosis of aorta: Secondary | ICD-10-CM

## 2019-08-17 DIAGNOSIS — R7303 Prediabetes: Secondary | ICD-10-CM | POA: Diagnosis not present

## 2019-08-17 DIAGNOSIS — E669 Obesity, unspecified: Secondary | ICD-10-CM

## 2019-08-17 DIAGNOSIS — R590 Localized enlarged lymph nodes: Secondary | ICD-10-CM

## 2019-08-17 DIAGNOSIS — Z9049 Acquired absence of other specified parts of digestive tract: Secondary | ICD-10-CM

## 2019-08-17 DIAGNOSIS — M791 Myalgia, unspecified site: Secondary | ICD-10-CM | POA: Diagnosis not present

## 2019-08-17 DIAGNOSIS — R918 Other nonspecific abnormal finding of lung field: Secondary | ICD-10-CM | POA: Insufficient documentation

## 2019-08-17 NOTE — Progress Notes (Signed)
Name: Kristine Alexander   MRN: [id number]    DOB: 19-Nov-1955   Date:08/17/2019       Progress Note  Chief Complaint  Patient presents with  . Follow-up  . Hyperlipidemia  . Medication Refill     Subjective:   Kristine Alexander is a 63 y.o. female, presents to clinic for routine follow up on the conditions listed above.  Patient was previously seeing Dr. Sherie Alexander, who has since retired from the practice.  She is here for follow-up of hyperlipidemia, prediabetes and also recently had cholecystectomy secondary to cholelithiasis and cholecystitis.  She is here with her daughter who is translating for her.  Hyperlipidemia:  Current Medication Regimen:  lipitor 10 mg Last Lipids: Lab Results  Component Value Date   CHOL 150 08/20/2018   HDL 46 (L) 08/20/2018   LDLCALC 86 08/20/2018   TRIG 87 08/20/2018   CHOLHDL 3.3 08/20/2018   - Current Diet:  Avoids fatty foods - Denies: Chest pain, shortness of breath Gets some pain to upper arms, neck, upper back, none to lower body, no associated fatigue or jaundice Daughter does state after translating for the patient that she is referring to as myalgias, she does work with her upper body and upper extremities but nothing extremely strenuous.   - Documented aortic atherosclerosis? Yes -reviewed recent CT imaging from July 2020, shows mild thoracic aortic atherosclerosis - Risk factors for atherosclerosis: diabetes mellitus and hypercholesterolemia  In reviewing past CT scans is incidental finding from CT abdomen pelvis from May 30, 2020 shows nodular structures in right upper lobe of the lungs, mild mediastinal and hilar lymphadenopathy with recommended noncontrast CT chest at 3 to 6 months for recommended follow-up  Prediabetes DM dx 2 years ago Currently managing with metformin 500 mg once a day Pt notes good med compliance Pt has no SE from meds. Has glucometer, not checking sugars  No hypoglycemic episodes Denies: Polyuria, polydipsia,  polyphagia, vision changes, or neuropathy Recent pertinent labs: Lab Results  Component Value Date   HGBA1C 5.9 (H) 08/20/2018   HGBA1C 5.9 (H) 02/18/2018   HGBA1C 5.9 (H) 08/18/2017      Component Value Date/Time   NA 138 05/31/2019 1006   NA 139 09/19/2015 0853   K 3.5 05/31/2019 1006   CL 104 05/31/2019 1006   CO2 24 05/31/2019 1006   GLUCOSE 121 (H) 05/31/2019 1006   BUN 13 05/31/2019 1006   BUN 11 09/19/2015 0853   CREATININE 0.53 05/31/2019 1006   CREATININE 0.56 02/21/2019 1235   CALCIUM 9.0 05/31/2019 1006   PROT 7.9 05/31/2019 1006   PROT 7.4 09/19/2015 0853   ALBUMIN 3.6 05/31/2019 1006   ALBUMIN 4.0 09/19/2015 0853   AST 27 05/31/2019 1006   ALT 31 05/31/2019 1006   ALKPHOS 89 05/31/2019 1006   BILITOT 0.4 05/31/2019 1006   BILITOT 0.3 09/19/2015 0853   GFRNONAA >60 05/31/2019 1006   GFRNONAA 99 02/21/2019 1235   GFRAA >60 05/31/2019 1006   GFRAA 115 02/21/2019 1235   Current diet: in general, a "healthy" diet   Current exercise: none  GERD:  Was using pepcid when having stomach pain, but no longer taking, everything improved with cholecystectomy.  History of microcytic anemia on the chart, patient is not supplementing for any deficiencies.  Her and her daughter were unaware of any diagnosis of anemia.  There is also a B12 deficiency noted in 2018, and upon further chart review in 2016 she was diagnosed with iron deficiency anemia  and was on daily iron supplement.  Patient denies any cold intolerance, shortness of breath, palpitations or tachycardia, pallor, fatigue.  She does complain of some pain and tingling in her lower extremities.  OA: For more than 5 years - per chart review  New pt hx from 05/16/2015: Kristine Alexander is here today along with her daughter to establish care. She is of [place] background but has been living in the [place] for nearly 20 years. She lives with her daughters, son in laws and grand children. She continues to be active  in her house hold chores, still cooking for all of her family. Despite her generalized osteoarthritic pain located in back, hands, knees, feet she continues to clean and cook and tend to her grandchildren and family which she enjoys doing.  Patient Active Problem List   Diagnosis Date Noted  . Hepatic steatosis 03/22/2019  . Cholelithiases 03/22/2019  . 'light-for-dates' infant with signs of fetal malnutrition 02/21/2019  . Allergic conjunctivitis of both eyes 02/18/2018  . Medication monitoring encounter 12/22/2017  . Prediabetes 08/20/2017  . Microcytic anemia 08/20/2017  . Vitamin B12 deficiency 08/20/2017  . Numbness 08/18/2017  . Microcytosis 03/01/2017  . SOB (shortness of breath) on exertion 10/03/2015  . Pedal edema 10/02/2015  . Heart murmur 09/18/2015  . Obesity, Class I, BMI 30-34.9 05/16/2015  . Osteoarthrosis, generalized, multiple joints 05/16/2015  . History of fall 05/16/2015  . Hyperlipidemia LDL goal <100 05/16/2015    Past Surgical History:  Procedure Laterality Date  . CATARACT EXTRACTION W/PHACO Right 02/25/2016   Procedure: CATARACT EXTRACTION PHACO AND INTRAOCULAR LENS PLACEMENT (IOC);  Surgeon: Sallee Lange, MD;  Location: [facility] ORS;  Service: Ophthalmology;  Laterality: Right;  [place]    00:48.2 AP%   23.5 CDE   22.58 fluid pack lot # [id number]  . CATARACT EXTRACTION W/PHACO Left 06/08/2018   Procedure: CATARACT EXTRACTION PHACO AND INTRAOCULAR LENS PLACEMENT (IOC);  Surgeon: Galen Manila, MD;  Location: [facility] OR[facility];  Service: Ophthalmology;  Laterality: Left;  [place] 00:28.4 AP% 13.0 CDE 3.68 Fluid pack Lot # [id number]  . CHOLECYSTECTOMY N/A 05/19/2019   Procedure: LAPAROSCOPIC CHOLECYSTECTOMY;  Surgeon: Duanne Guess, MD;  Location: [facility] ORS;  Service: General;  Laterality: N/A;  . TUBAL LIGATION      Family History  Problem Relation Age of Onset  . Hyperlipidemia Mother   . Hyperlipidemia Father   . Hyperlipidemia Sister   . Diabetes Brother   .  Hyperlipidemia Brother     Social History   Socioeconomic History  . Marital status: Married    Spouse name: Not on file  . Number of children: Not on file  . Years of education: Not on file  . Highest education level: Not on file  Occupational History  . Not on file  Social Needs  . Financial resource strain: Not on file  . Food insecurity    Worry: Not on file    Inability: Not on file  . Transportation needs    Medical: Not on file    Non-medical: Not on file  Tobacco Use  . Smoking status: Never Smoker  . Smokeless tobacco: Never Used  Substance and Sexual Activity  . Alcohol use: No    Alcohol/week: 0.0 standard drinks  . Drug use: No  . Sexual activity: Not Currently  Lifestyle  . Physical activity    Days per week: Not on file    Minutes per session: Not on file  . Stress: Not on file  Relationships  . Social Musician on phone: Not on file    Gets together: Not on file    Attends religious service: Not on file    Active member of club or organization: Not on file    Attends meetings of clubs or organizations: Not on file    Relationship status: Not on file  . Intimate partner violence    Fear of current or ex partner: Not on file    Emotionally abused: Not on file    Physically abused: Not on file    Forced sexual activity: Not on file  Other Topics Concern  . Not on file  Social History Narrative  . Not on file     Current Outpatient Medications:  .  atorvastatin (LIPITOR) 10 MG tablet, Take one daily, Disp: 15 tablet, Rfl: 0 .  famotidine (PEPCID) 20 MG tablet, Take 1 tablet (20 mg total) by mouth 2 (two) times daily., Disp: 60 tablet, Rfl: 0 .  HYDROcodone-acetaminophen (NORCO/VICODIN) 5-325 MG tablet, Take 1 tablet by mouth every 6 (six) hours as needed for moderate pain., Disp: 15 tablet, Rfl: 0 .  ibuprofen (ADVIL) 800 MG tablet, Take 1 tablet (800 mg total) by mouth every 8 (eight) hours as needed., Disp: 30 tablet, Rfl: 0 .   metFORMIN (GLUCOPHAGE-XR) 500 MG 24 hr tablet, TAKE 2 TABLETS(1000 MG) BY MOUTH DAILY, Disp: 180 tablet, Rfl: 0  No Known Allergies  I personally reviewed active problem list, medication list, allergies, family history, social history, health maintenance, notes from last several encounters, lab results, imaging with the patient/caregiver today.  Review of Systems   Objective:    Vitals:   08/17/19 1535  BP: 128/68  Pulse: 94  Resp: 14  Temp: 98.1 F (36.7 C)  SpO2: 99%  Weight: 169 lb 4.8 oz (76.8 kg)  Height: 5' (1.524 m)    Body mass index is 33.06 kg/m.  Physical Exam Vitals signs and nursing note reviewed.  Constitutional:      General: She is not in acute distress.    Appearance: Normal appearance. She is well-developed. She is not ill-appearing, toxic-appearing or diaphoretic.     Interventions: Face mask in place.  HENT:     Head: Normocephalic and atraumatic.     Right Ear: External ear normal.     Left Ear: External ear normal.  Eyes:     General: Lids are normal. No scleral icterus.       Right eye: No discharge.        Left eye: No discharge.     Conjunctiva/sclera: Conjunctivae normal.     Pupils: Pupils are equal, round, and reactive to light.  Neck:     Musculoskeletal: Normal range of motion and neck supple.     Trachea: Phonation normal. No tracheal deviation.  Cardiovascular:     Rate and Rhythm: Normal rate and regular rhythm.     Pulses: Normal pulses.          Radial pulses are 2+ on the right side and 2+ on the left side.       Posterior tibial pulses are 2+ on the right side and 2+ on the left side.     Heart sounds: Murmur (systolic murmur heard best aortic area) present. No friction rub. No gallop.   Pulmonary:     Effort: Pulmonary effort is normal. No respiratory distress.     Breath sounds: Normal breath sounds. No stridor. No wheezing, rhonchi or rales.  Chest:     Chest wall: No tenderness.  Abdominal:     General: Bowel sounds are  normal. There is no distension.     Palpations: Abdomen is soft.     Tenderness: There is no abdominal tenderness. There is no guarding or rebound.  Musculoskeletal:        General: No deformity.     Right lower leg: No edema.     Left lower leg: No edema.     Comments: All muscle compartments soft, no tenderness to palpation to upper extremities upper back and neck  Lymphadenopathy:     Cervical: No cervical adenopathy.  Skin:    General: Skin is warm and dry.     Capillary Refill: Capillary refill takes less than 2 seconds.     Coloration: Skin is not jaundiced or pale.     Findings: No rash.  Neurological:     Mental Status: She is alert.     Sensory: No sensory deficit.     Motor: No weakness or abnormal muscle tone.     Coordination: Coordination normal.     Gait: Gait normal.  Psychiatric:        Speech: Speech normal.        Behavior: Behavior normal.      Recent Results (from the past 2160 hour(s))  CBC with Differential     Status: Abnormal   Collection Time: 05/31/19 10:06 AM  Result Value Ref Range   WBC 10.5 4.0 - 10.5 K/uL   RBC 4.77 3.87 - 5.11 MIL/uL   Hemoglobin 11.9 (L) 12.0 - 15.0 g/dL   HCT 37.8 36.0 - 46.0 %   MCV 79.2 (L) 80.0 - 100.0 fL   MCH 24.9 (L) 26.0 - 34.0 pg   MCHC 31.5 30.0 - 36.0 g/dL   RDW 14.1 11.5 - 15.5 %   Platelets 324 150 - 400 K/uL   nRBC 0.0 0.0 - 0.2 %   Neutrophils Relative % 66 %   Neutro Abs 6.9 1.7 - 7.7 K/uL   Lymphocytes Relative 22 %   Lymphs Abs 2.3 0.7 - 4.0 K/uL   Monocytes Relative 8 %   Monocytes Absolute 0.8 0.1 - 1.0 K/uL   Eosinophils Relative 4 %   Eosinophils Absolute 0.4 0.0 - 0.5 K/uL   Basophils Relative 0 %   Basophils Absolute 0.0 0.0 - 0.1 K/uL   Immature Granulocytes 0 %   Abs Immature Granulocytes 0.04 0.00 - 0.07 K/uL    Comment: Performed at [facility], [address] [place], [place] [address]  Comprehensive metabolic panel     Status: Abnormal   Collection Time: 05/31/19 10:06 AM   Result Value Ref Range   Sodium 138 135 - 145 mmol/L   Potassium 3.5 3.5 - 5.1 mmol/L   Chloride 104 98 - 111 mmol/L   CO2 24 22 - 32 mmol/L   Glucose, Bld 121 (H) 70 - 99 mg/dL   BUN 13 8 - 23 mg/dL   Creatinine, Ser 0.53 0.44 - 1.00 mg/dL   Calcium 9.0 8.9 - 10.3 mg/dL   Total Protein 7.9 6.5 - 8.1 g/dL   Albumin 3.6 3.5 - 5.0 g/dL   AST 27 15 - 41 U/L   ALT 31 0 - 44 U/L   Alkaline Phosphatase 89 38 - 126 U/L   Total Bilirubin 0.4 0.3 - 1.2 mg/dL   GFR calc non Af Amer >60 >60 mL/min   GFR calc Af Amer >60 >  60 mL/min   Anion gap 10 5 - 15    Comment: Performed at [facility], [address] [place], [place] [address]  Lipase, blood     Status: None   Collection Time: 05/31/19 10:06 AM  Result Value Ref Range   Lipase 35 11 - 51 U/L    Comment: Performed at [facility], [address] [place], [place] [address]  Troponin I (High Sensitivity)     Status: None   Collection Time: 05/31/19 10:06 AM  Result Value Ref Range   Troponin I (High Sensitivity) 3 <18 ng/L    Comment: (NOTE) Elevated high sensitivity troponin I (hsTnI) values and significant  changes across serial measurements may suggest ACS but many other  chronic and acute conditions are known to elevate hsTnI results.  Refer to the "Links" section for chest pain algorithms and additional  guidance. Performed at [facility], [address], [place], [place] [address]   SARS Coronavirus 2 (CEPHEID - Performed in [facility] hospital lab), Hosp Order     Status: None   Collection Time: 05/31/19 10:06 AM   Specimen: Nasopharyngeal Swab  Result Value Ref Range   SARS Coronavirus 2 NEGATIVE NEGATIVE    Comment: (NOTE) If result is NEGATIVE SARS-CoV-2 target nucleic acids are NOT DETECTED. The SARS-CoV-2 RNA is generally detectable in upper and lower  respiratory specimens during the acute phase of infection. The lowest  concentration of SARS-CoV-2 viral copies this assay can  detect is 250  copies / mL. A negative result does not preclude SARS-CoV-2 infection  and should not be used as the sole basis for treatment or other  patient management decisions.  A negative result may occur with  improper specimen collection / handling, submission of specimen other  than nasopharyngeal swab, presence of viral mutation(s) within the  areas targeted by this assay, and inadequate number of viral copies  (<250 copies / mL). A negative result must be combined with clinical  observations, patient history, and epidemiological information. If result is POSITIVE SARS-CoV-2 target nucleic acids are DETECTED. The SARS-CoV-2 RNA is generally detectable in upper and lower  respiratory specimens dur ing the acute phase of infection.  Positive  results are indicative of active infection with SARS-CoV-2.  Clinical  correlation with patient history and other diagnostic information is  necessary to determine patient infection status.  Positive results do  not rule out bacterial infection or co-infection with other viruses. If result is PRESUMPTIVE POSTIVE SARS-CoV-2 nucleic acids MAY BE PRESENT.   A presumptive positive result was obtained on the submitted specimen  and confirmed on repeat testing.  While 2019 novel coronavirus  (SARS-CoV-2) nucleic acids may be present in the submitted sample  additional confirmatory testing may be necessary for epidemiological  and / or clinical management purposes  to differentiate between  SARS-CoV-2 and other Sarbecovirus currently known to infect humans.  If clinically indicated additional testing with an alternate test  methodology [phone]) is advised. The SARS-CoV-2 RNA is generally  detectable in upper and lower respiratory sp ecimens during the acute  phase of infection. The expected result is Negative. Fact Sheet for Patients:  BoilerBrush.com.cy Fact Sheet for Healthcare Providers:  https://pope.com/ This test is not yet approved or cleared by the [place] FDA and has been authorized for detection and/or diagnosis of SARS-CoV-2 by FDA under an Emergency Use Authorization (EUA).  This EUA will remain in effect (meaning this test can be used) for the duration  of the COVID-19 declaration under Section 564(b)(1) of the Act, 21 U.S.C. section 360bbb-3(b)(1), unless the authorization is terminated or revoked sooner. Performed at [facility], [address] [place], [place] [address]   Protime-INR     Status: None   Collection Time: 05/31/19 10:06 AM  Result Value Ref Range   Prothrombin Time 13.6 11.4 - 15.2 seconds   INR 1.1 0.8 - 1.2    Comment: (NOTE) INR goal varies based on device and disease states. Performed at [facility], [address] [place], [place] [address]   APTT     Status: None   Collection Time: 05/31/19 10:06 AM  Result Value Ref Range   aPTT 27 24 - 36 seconds    Comment: Performed at [facility], [address] [place], [place] [address]    PHQ2/9: [facility] screen [facility] 08/17/2019 02/21/2019 01/20/2019 08/20/2018 02/18/2018  Decreased Interest 0 0 0 0 0  Down, Depressed, Hopeless 0 0 0 0 0  PHQ - 2 Score 0 0 0 0 0  Altered sleeping 0 0 0 0 -  Tired, decreased energy 0 0 0 0 -  Change in appetite 0 0 0 0 -  Feeling bad or failure about yourself  0 0 0 0 -  Trouble concentrating 0 0 0 0 -  Moving slowly or fidgety/restless 0 0 0 0 -  Suicidal thoughts 0 0 0 0 -  PHQ-9 Score 0 0 0 0 -  Difficult doing work/chores Not difficult at all Not difficult at all Not difficult at all - -    phq 9 is negative Reviewed today  Fall Risk: Fall Risk  08/17/2019 03/03/2019 02/21/2019 01/20/2019 08/20/2018  Falls in the past year? 0 0 0 0 No  Number falls in past yr: 0 - 0 0 -  Injury with Fall? 0 - 0 0 -  Follow up - - - Falls evaluation completed -    Functional Status Survey: Is the  patient deaf or have difficulty hearing?: No Does the patient have difficulty seeing, even when wearing glasses/contacts?: No Does the patient have difficulty concentrating, remembering, or making decisions?: No Does the patient have difficulty walking or climbing stairs?: No Does the patient have difficulty dressing or bathing?: No Does the patient have difficulty doing errands alone such as visiting a doctor's office or shopping?: No    Assessment & Plan:    1. Hyperlipidemia LDL goal <100 compliant with med and diet efforts, possibly myalgias from lipitor, pt will hold meds for 1-2 weeks, checking LFT and CK, 2 wk f/up via mychart Patient has no jaundice or fatigue, doubt myopathy, and I do suspect that her pain may be more likely from osteoarthritis - COMPLETE METABOLIC PANEL WITH GFR - Lipid panel  2. Prediabetes on 500 mg metformin, Rx from over a year ago was for titration up to 1000 BID -but she has never taken more than 1 dose a day, encouraged her to increase to 500 mg BID, recheck labs, no A1C improvement with meds over past 1.5 yrs - COMPLETE METABOLIC PANEL WITH GFR - Lipid panel - Hemoglobin A1c  3. Myalgia recheck CMP and CK, hold statin, pt to f/up with in 2 weeks via mychart re improvement or persistance of arm and shoulder pain - COMPLETE METABOLIC PANEL WITH GFR - CK (Creatine Kinase)  4. Anemia, unspecified type Reviewed chart more extensively because patient was unaware of anemia diagnosis she has in chart history history of  vitamin B-12 deficiency and also iron deficiency anemia was on daily iron supplement 4 years ago.  May need to add on iron panel & B12  Patient endorse some paresthesias in her lower extremities may be due to deficiency? - CBC with Differential/Platelet  5. Lung nodule, multiple found reviewing CT abd/pelvis and CTA chest from ER visit 05/31/2019, recommended f/up in 3-6 months  6. Mediastinal lymphadenopathy found reviewing CT abd/pelvis  and CTA chest from ER visit 05/31/2019  7. Medication monitoring encounter - COMPLETE METABOLIC PANEL WITH GFR - Lipid panel - Hemoglobin A1c - CK (Creatine Kinase) - CBC with Differential/Platelet  8. Class 1 obesity with serious comorbidity and body mass index (BMI) of 33.0 to 33.9 in adult, unspecified obesity type reduce calories and increase physical activity  I did find her abnormal CT chest findings after the patient left today while I was reviewing her past medical history and bring chart review.  Her daughter was going to set up my chart so I will need to reach out to them through my chart or have her come back in to discuss the prior findings in July and the recommended follow-up of noncontrasted CT chest in the next 1to 4 months.  Did asked the patient today if she had any chest pain shortness of breath, palpitations, orthopnea, lower extremity edema, near syncope and she denies any of this.  She does have a right upper sternal border/aortic area systolic murmur which I also mentioned to the patient and her daughter and they were unaware that she had this, this was documented several years ago.  - per Dr. Debby Freiberg OV documentation from 09/18/2015 for CPE -he wrote that the heart murmur was new finding he recommended cardiology evaluation and patient was referred to cardiology for further follow-up She has not had any cardiac symptoms I do not see any cardiac follow-up or echocardiogram done per available records in EMR and care everywhere.  Patient and her daughter stated today that they are going to get her flu shot done It appears patient is overdue for Pap smear and I do not see any documentation of prior mammograms, patient was encouraged to return for her annual well visit, at that time we will try to address some of her care gaps  Upon further review of more records through the past several years patient had been referred to specialist and although had imaging ordered multiple  times but she repeatedly refuses any imaging studies, she also has had a few no-show visits. With the language barrier I am concerned that there may be some other issue limiting her care, ie: belief system? Anxiety? Fear of radiation? Finances? Feel it is important to address this so that barriers to care can be overcome and patient can get the required work-up that she needs  Return in about 6 months (around 02/14/2020) for Routine follow-up, Annual Physical split visit.   Danelle Berry, PA-C 08/17/19 3:38 PM

## 2019-08-17 NOTE — Patient Instructions (Signed)
We will call you with your lab results  For the next 1-2 weeks hold your lipitor medicine and please send me a mychart message in 2 weeks to let me know if your muscle pain improved with holding the med.      If no change, then I will refill your medicine.  If your muscles feel better we will need to switch to an alternate medicine.

## 2019-08-18 LAB — CBC WITH DIFFERENTIAL/PLATELET
Absolute Monocytes: 604 cells/uL (ref 200–950)
Basophils Absolute: 43 cells/uL (ref 0–200)
Basophils Relative: 0.5 %
Eosinophils Absolute: 204 cells/uL (ref 15–500)
Eosinophils Relative: 2.4 %
HCT: 36.8 % (ref 35.0–45.0)
Hemoglobin: 11.9 g/dL (ref 11.7–15.5)
Lymphs Abs: 3077 cells/uL (ref 850–3900)
MCH: 25.1 pg — ABNORMAL LOW (ref 27.0–33.0)
MCHC: 32.3 g/dL (ref 32.0–36.0)
MCV: 77.5 fL — ABNORMAL LOW (ref 80.0–100.0)
MPV: 10.1 fL (ref 7.5–12.5)
Monocytes Relative: 7.1 %
Neutro Abs: 4573 cells/uL (ref 1500–7800)
Neutrophils Relative %: 53.8 %
Platelets: 284 10*3/uL (ref 140–400)
RBC: 4.75 10*6/uL (ref 3.80–5.10)
RDW: 14.8 % (ref 11.0–15.0)
Total Lymphocyte: 36.2 %
WBC: 8.5 10*3/uL (ref 3.8–10.8)

## 2019-08-18 LAB — COMPLETE METABOLIC PANEL WITH GFR
AG Ratio: 1.2 (calc) (ref 1.0–2.5)
ALT: 15 U/L (ref 6–29)
AST: 19 U/L (ref 10–35)
Albumin: 4.1 g/dL (ref 3.6–5.1)
Alkaline phosphatase (APISO): 64 U/L (ref 37–153)
BUN: 17 mg/dL (ref 7–25)
CO2: 25 mmol/L (ref 20–32)
Calcium: 9.1 mg/dL (ref 8.6–10.4)
Chloride: 107 mmol/L (ref 98–110)
Creat: 0.57 mg/dL (ref 0.50–0.99)
GFR, Est African American: 114 mL/min/{1.73_m2} (ref >=60)
GFR, Est Non African American: 99 mL/min/{1.73_m2} (ref >=60)
Globulin: 3.5 g/dL (calc) (ref 1.9–3.7)
Glucose, Bld: 122 mg/dL — ABNORMAL HIGH (ref 65–99)
Potassium: 4.1 mmol/L (ref 3.5–5.3)
Sodium: 141 mmol/L (ref 135–146)
Total Bilirubin: 0.3 mg/dL (ref 0.2–1.2)
Total Protein: 7.6 g/dL (ref 6.1–8.1)

## 2019-08-18 LAB — HEMOGLOBIN A1C
Hgb A1c MFr Bld: 5.9 % of total Hgb — ABNORMAL HIGH (ref <5.7)
Mean Plasma Glucose: 123 (calc)
eAG (mmol/L): 6.8 (calc)

## 2019-08-18 LAB — LIPID PANEL
Cholesterol: 144 mg/dL (ref <200)
HDL: 43 mg/dL — ABNORMAL LOW (ref >=50)
LDL Cholesterol (Calc): 84 mg/dL (calc)
Non-HDL Cholesterol (Calc): 101 mg/dL (calc) (ref <130)
Total CHOL/HDL Ratio: 3.3 (calc) (ref <5.0)
Triglycerides: 82 mg/dL (ref <150)

## 2019-08-18 LAB — CK: Total CK: 92 U/L (ref 29–143)

## 2019-08-19 ENCOUNTER — Telehealth: Payer: Self-pay

## 2019-08-19 NOTE — Telephone Encounter (Signed)
Copied from [place] [id number]. Topic: Quick Communication - Lab Results (Clinic Use ONLY) >> Aug 19, 2019 12:36 PM Scherrie Gerlach wrote: Pt would like call back about labs.  She did not understand, even though it says spoke with pt. Please call

## 2019-08-31 ENCOUNTER — Other Ambulatory Visit: Payer: Self-pay

## 2019-08-31 MED ORDER — METFORMIN HCL ER 500 MG PO TB24: ORAL_TABLET | ORAL | 0 refills | Status: DC

## 2019-09-06 ENCOUNTER — Other Ambulatory Visit: Payer: Self-pay

## 2019-09-06 MED ORDER — METFORMIN HCL ER 500 MG PO TB24: ORAL_TABLET | ORAL | 0 refills | Status: DC

## 2019-11-17 ENCOUNTER — Encounter: Payer: Self-pay | Admitting: Family Medicine

## 2019-11-17 ENCOUNTER — Ambulatory Visit (INDEPENDENT_AMBULATORY_CARE_PROVIDER_SITE_OTHER): Payer: BLUE CROSS/BLUE SHIELD | Admitting: Family Medicine

## 2019-11-17 ENCOUNTER — Other Ambulatory Visit: Payer: Self-pay

## 2019-11-17 VITALS — BP 126/68 | HR 87 | Temp 97.9°F | Resp 14 | Ht 60.0 in | Wt 169.3 lb

## 2019-11-17 DIAGNOSIS — R590 Localized enlarged lymph nodes: Secondary | ICD-10-CM | POA: Diagnosis not present

## 2019-11-17 DIAGNOSIS — Z23 Encounter for immunization: Secondary | ICD-10-CM

## 2019-11-17 DIAGNOSIS — R519 Headache, unspecified: Secondary | ICD-10-CM

## 2019-11-17 DIAGNOSIS — R918 Other nonspecific abnormal finding of lung field: Secondary | ICD-10-CM

## 2019-11-17 NOTE — Patient Instructions (Addendum)
Drink plenty of water and clear fluids, you can take tylenol 650 mg - 1000 mg up to three times a day and ibuprofen 200-400 mg up to three times a day for headache  Follow up if it is getting worse  General Headache Without Cause A headache is pain or discomfort that is felt around the head or neck area. There are many causes and types of headaches. In some cases, the cause may not be found. Follow these instructions at home: Watch your condition for any changes. Let your doctor know about them. Take these steps to help with your condition: Managing pain      Take over-the-counter and prescription medicines only as told by your doctor.  Lie down in a dark, quiet room when you have a headache.  If told, put ice on your head and neck area: ? Put ice in a plastic bag. ? Place a towel between your skin and the bag. ? Leave the ice on for 20 minutes, 2-3 times per day.  If told, put heat on the affected area. Use the heat source that your doctor recommends, such as a moist heat pack or a heating pad. ? Place a towel between your skin and the heat source. ? Leave the heat on for 20-30 minutes. ? Remove the heat if your skin turns bright red. This is very important if you are unable to feel pain, heat, or cold. You may have a greater risk of getting burned.  Keep lights dim if bright lights bother you or make your headaches worse. Eating and drinking  Eat meals on a regular schedule.  If you drink alcohol: ? Limit how much you use to:  0-1 drink a day for women.  0-2 drinks a day for men. ? Be aware of how much alcohol is in your drink. In the [place] one drink equals one 12 oz bottle of beer (355 mL), one 5 oz glass of wine (148 mL), or one 1 oz glass of hard liquor (44 mL).  Stop drinking caffeine, or reduce how much caffeine you drink. General instructions   Keep a journal to find out if certain things bring on headaches. For example, write down: ? What you eat and  drink. ? How much sleep you get. ? Any change to your diet or medicines.  Get a massage or try other ways to relax.  Limit stress.  Sit up straight. Do not tighten (tense) your muscles.  Do not use any products that contain nicotine or tobacco. This includes cigarettes, e-cigarettes, and chewing tobacco. If you need help quitting, ask your doctor.  Exercise regularly as told by your doctor.  Get enough sleep. This often means 7-9 hours of sleep each night.  Keep all follow-up visits as told by your doctor. This is important. Contact a doctor if:  Your symptoms are not helped by medicine.  You have a headache that feels different than the other headaches.  You feel sick to your stomach (nauseous) or you throw up (vomit).  You have a fever. Get help right away if:  Your headache gets very bad quickly.  Your headache gets worse after a lot of physical activity.  You keep throwing up.  You have a stiff neck.  You have trouble seeing.  You have trouble speaking.  You have pain in the eye or ear.  Your muscles are weak or you lose muscle control.  You lose your balance or have trouble walking.  You feel like  you will pass out (faint) or you pass out.  You are mixed up (confused).  You have a seizure. Summary  A headache is pain or discomfort that is felt around the head or neck area.  There are many causes and types of headaches. In some cases, the cause may not be found.  Keep a journal to help find out what causes your headaches. Watch your condition for any changes. Let your doctor know about them.  Contact a doctor if you have a headache that is different from usual, or if your headache is not helped by medicine.  Get help right away if your headache gets very bad, you throw up, you have trouble seeing, you lose your balance, or you have a seizure. This information is not intended to replace advice given to you by your health care provider. Make sure you  discuss any questions you have with your health care provider. Document Revised: 05/24/2018 Document Reviewed: 05/24/2018 Elsevier Patient Education  2020 [employer].

## 2019-11-17 NOTE — Progress Notes (Signed)
Patient ID: Kristine Alexander, female    DOB: May 20, 1956, 63 y.o.   MRN: [id number]  PCP: Danelle Berryapia, Renesme Kerrigan, PA-C  Chief Complaint  Patient presents with  . Migraine    onset 5 days in middle upper head.  No nausea or vomiting    Subjective:   Kristine Alexander is a 63 y.o. female, presents to clinic with CC of the following:  Headache  This is a new problem. Episode onset: 5. The problem occurs intermittently. The problem has been unchanged. The pain is located in the parietal region. Radiates to: forehead. The pain quality is not similar to prior headaches. The quality of the pain is described as pulsating, aching, squeezing and throbbing. The pain is moderate. Pertinent negatives include no abdominal pain, anorexia, back pain, blurred vision, coughing, fever, muscle aches, nausea, neck pain, numbness, phonophobia, photophobia, scalp tenderness, sinus pressure, sore throat, swollen glands, tingling, tinnitus, visual change, vomiting, weakness or weight loss.      Patient Active Problem List   Diagnosis Date Noted  . Thoracic aorta atherosclerosis (HCC) 08/17/2019  . Hyperlipidemia associated with type 2 diabetes mellitus (HCC) 08/17/2019  . Mediastinal lymphadenopathy 08/17/2019  . Lung nodule, multiple 08/17/2019  . Aortic systolic murmur on examination 08/17/2019  . S/P cholecystectomy 08/17/2019  . Hepatic steatosis 03/22/2019  . Prediabetes 08/20/2017  . Anemia 08/20/2017  . Vitamin B12 deficiency 08/20/2017  . Cardiac murmur 09/18/2015  . Obesity 05/16/2015  . Osteoarthrosis, generalized, multiple joints 05/16/2015  . Hyperlipidemia 05/16/2015      Current Outpatient Medications:  .  metFORMIN (GLUCOPHAGE-XR) 500 MG 24 hr tablet, TAKE 2 TABLETS(1000 MG) BY MOUTH DAILY, Disp: 180 tablet, Rfl: 0 .  atorvastatin (LIPITOR) 10 MG tablet, Take one daily (Patient not taking: Reported on 11/17/2019), Disp: 15 tablet, Rfl: 0   No Known Allergies   Family History  Problem  Relation Age of Onset  . Hyperlipidemia Mother   . Hyperlipidemia Father   . Hyperlipidemia Sister   . Diabetes Brother   . Hyperlipidemia Brother      Social History   Socioeconomic History  . Marital status: Married    Spouse name: Not on file  . Number of children: Not on file  . Years of education: Not on file  . Highest education level: Not on file  Occupational History  . Not on file  Tobacco Use  . Smoking status: Never Smoker  . Smokeless tobacco: Never Used  Substance and Sexual Activity  . Alcohol use: No    Alcohol/week: 0.0 standard drinks  . Drug use: No  . Sexual activity: Not Currently  Other Topics Concern  . Not on file  Social History Narrative  . Not on file   Social Determinants of Health   Financial Resource Strain:   . Difficulty of Paying Living Expenses: Not on file  Food Insecurity:   . Worried About Programme researcher, broadcasting/film/videounning Out of Food in the Last Year: Not on file  . Ran Out of Food in the Last Year: Not on file  Transportation Needs:   . Lack of Transportation (Medical): Not on file  . Lack of Transportation (Non-Medical): Not on file  Physical Activity:   . Days of Exercise per Week: Not on file  . Minutes of Exercise per Session: Not on file  Stress:   . Feeling of Stress : Not on file  Social Connections:   . Frequency of Communication with Friends and Family: Not on file  . Frequency of Social  Gatherings with Friends and Family: Not on file  . Attends Religious Services: Not on file  . Active Member of Clubs or Organizations: Not on file  . Attends Archivist Meetings: Not on file  . Marital Status: Not on file  Intimate Partner Violence:   . Fear of Current or Ex-Partner: Not on file  . Emotionally Abused: Not on file  . Physically Abused: Not on file  . Sexually Abused: Not on file    I personally reviewed active problem list, medication list, allergies, family history, social history, health maintenance, notes from last  encounter, lab results, imaging with the patient/caregiver today.   Review of Systems  Constitutional: Negative.  Negative for fever and weight loss.  HENT: Negative.  Negative for sinus pressure, sore throat and tinnitus.   Eyes: Negative.  Negative for blurred vision and photophobia.  Respiratory: Negative.  Negative for cough.   Cardiovascular: Negative.   Gastrointestinal: Negative.  Negative for abdominal pain, anorexia, nausea and vomiting.  Endocrine: Negative.   Genitourinary: Negative.   Musculoskeletal: Negative.  Negative for back pain and neck pain.  Skin: Negative.   Allergic/Immunologic: Negative.   Neurological: Positive for headaches. Negative for tingling, weakness and numbness.  Hematological: Negative.   Psychiatric/Behavioral: Negative.   All other systems reviewed and are negative.      Objective:   Vitals:   11/17/19 1049  BP: 126/68  Pulse: 87  Resp: 14  Temp: 97.9 F (36.6 C)  SpO2: 96%  Weight: 169 lb 4.8 oz (76.8 kg)  Height: 5' (1.524 m)    Body mass index is 33.06 kg/m.  Physical Exam Vitals and nursing note reviewed.  Constitutional:      General: She is not in acute distress.    Appearance: Normal appearance. She is well-developed. She is not ill-appearing, toxic-appearing or diaphoretic.     Interventions: Face mask in place.  HENT:     Head: Normocephalic and atraumatic.     Right Ear: Tympanic membrane, ear canal and external ear normal.     Left Ear: Tympanic membrane, ear canal and external ear normal.     Nose: Nose normal. No congestion or rhinorrhea.     Mouth/Throat:     Mouth: Mucous membranes are moist.     Pharynx: Oropharynx is clear.  Eyes:     General: Lids are normal. No scleral icterus.       Right eye: No discharge.        Left eye: No discharge.     Conjunctiva/sclera: Conjunctivae normal.  Neck:     Trachea: Phonation normal. No tracheal deviation.  Cardiovascular:     Rate and Rhythm: Normal rate and  regular rhythm.     Pulses: Normal pulses.          Radial pulses are 2+ on the right side and 2+ on the left side.       Posterior tibial pulses are 2+ on the right side and 2+ on the left side.     Heart sounds: Normal heart sounds. No murmur. No friction rub. No gallop.   Pulmonary:     Effort: Pulmonary effort is normal. No respiratory distress.     Breath sounds: Normal breath sounds. No stridor. No wheezing, rhonchi or rales.  Chest:     Chest wall: No tenderness.  Abdominal:     General: Bowel sounds are normal. There is no distension.     Palpations: Abdomen is soft.  Tenderness: There is no abdominal tenderness. There is no guarding or rebound.  Musculoskeletal:        General: No deformity. Normal range of motion.     Cervical back: Normal range of motion and neck supple.     Right lower leg: No edema.     Left lower leg: No edema.  Lymphadenopathy:     Cervical: No cervical adenopathy.  Skin:    General: Skin is warm and dry.     Capillary Refill: Capillary refill takes less than 2 seconds.     Coloration: Skin is not jaundiced or pale.     Findings: No rash.  Neurological:     Mental Status: She is alert and oriented to person, place, and time.     Motor: No abnormal muscle tone.     Gait: Gait normal.     Comments: MENTAL STATUS:  memory intact, fund of knowledge appropriate  LANG/SPEECH: fluent, no dysarthria, follows 3-step commands, answers questions appropriately  CRANIAL NERVES:   II: Pupils equal and reactive, no RAPD   III, IV, VI: EOM intact, no gaze preference or deviation, no nystagmus.   V: normal sensation in V1, V2, and V3 segments bilaterally   VII: no asymmetry, no nasolabial fold flattening   VIII: normal hearing to speech   IX, X: normal palatal elevation, no uvular deviation   XI: 5/5 head turn and 5/5 shoulder shrug bilaterally   XII: midline tongue protrusion  MOTOR:  5/5 bilateral grip strength 5/5 strength dorsiflexion/plantarflexion  b/l  SENSORY:  Normal to light touch Romberg absent  COORD: Normal finger to nose and heel to shin, no tremor, no dysmetria  STATION: normal stance, no truncal ataxia  GAIT: Normal; patient able to tip-toe, heel-walk.   Psychiatric:        Speech: Speech normal.        Behavior: Behavior normal.      Results for orders placed or performed in visit on 08/17/19  COMPLETE METABOLIC PANEL WITH GFR  Result Value Ref Range   Glucose, Bld 122 (H) 65 - 99 mg/dL   BUN 17 7 - 25 mg/dL   Creat 1.61 0.96 - 0.45 mg/dL   GFR, Est Non African American 99 > OR = 60 mL/min/1.65m2   GFR, Est African American 114 > OR = 60 mL/min/1.5m2   BUN/Creatinine Ratio NOT APPLICABLE 6 - 22 (calc)   Sodium 141 135 - 146 mmol/L   Potassium 4.1 3.5 - 5.3 mmol/L   Chloride 107 98 - 110 mmol/L   CO2 25 20 - 32 mmol/L   Calcium 9.1 8.6 - 10.4 mg/dL   Total Protein 7.6 6.1 - 8.1 g/dL   Albumin 4.1 3.6 - 5.1 g/dL   Globulin 3.5 1.9 - 3.7 g/dL (calc)   AG Ratio 1.2 1.0 - 2.5 (calc)   Total Bilirubin 0.3 0.2 - 1.2 mg/dL   Alkaline phosphatase (APISO) 64 37 - 153 U/L   AST 19 10 - 35 U/L   ALT 15 6 - 29 U/L  Lipid panel  Result Value Ref Range   Cholesterol 144 <200 mg/dL   HDL 43 (L) > OR = 50 mg/dL   Triglycerides 82 <409 mg/dL   LDL Cholesterol (Calc) 84 mg/dL (calc)   Total CHOL/HDL Ratio 3.3 <5.0 (calc)   Non-HDL Cholesterol (Calc) 101 <130 mg/dL (calc)  Hemoglobin W1X  Result Value Ref Range   Hgb A1c MFr Bld 5.9 (H) <5.7 % of total Hgb   Mean  Plasma Glucose 123 (calc)   eAG (mmol/L) 6.8 (calc)  CK (Creatine Kinase)  Result Value Ref Range   Total CK 92 29 - 143 U/L  CBC with Differential/Platelet  Result Value Ref Range   WBC 8.5 3.8 - 10.8 Thousand/uL   RBC 4.75 3.80 - 5.10 Million/uL   Hemoglobin 11.9 11.7 - 15.5 g/dL   HCT 62.2 63.3 - 35.4 %   MCV 77.5 (L) 80.0 - 100.0 fL   MCH 25.1 (L) 27.0 - 33.0 pg   MCHC 32.3 32.0 - 36.0 g/dL   RDW 56.2 56.3 - 89.3 %   Platelets 284 140 - 400  Thousand/uL   MPV 10.1 7.5 - 12.5 fL   Neutro Abs 4,573 1,500 - 7,800 cells/uL   Lymphs Abs 3,077 850 - 3,900 cells/uL   Absolute Monocytes 604 200 - 950 cells/uL   Eosinophils Absolute 204 15 - 500 cells/uL   Basophils Absolute 43 0 - 200 cells/uL   Neutrophils Relative % 53.8 %   Total Lymphocyte 36.2 %   Monocytes Relative 7.1 %   Eosinophils Relative 2.4 %   Basophils Relative 0.5 %        Assessment & Plan:      ICD-10-CM   1. Acute nonintractable headache, unspecified headache type  R51.9    non-focal neuro, no red flags, no signs of infection, unclear etiology, encouraged to OTC med, hydrate, rest, f/up if red flags or not improving  2. Mass of upper lobe of lung  R91.8 CT Chest Wo Contrast   reviewed with pt incidental finding on old CT and advised f/up on lymphadenopathy/nodules/mass, no pulm sx, agrees to CT  3. Hilar lymphadenopathy  R59.0 CT Chest Wo Contrast  4. Mediastinal lymphadenopathy  R59.0 CT Chest Wo Contrast  5. Need for influenza vaccination  Z23 Flu Vaccine QUAD 6+ mos PF IM (Fluarix Quad PF)        Danelle Berry, PA-C 11/17/19 10:58 AM

## 2019-12-21 ENCOUNTER — Other Ambulatory Visit: Payer: Self-pay | Admitting: Family Medicine

## 2020-02-14 ENCOUNTER — Ambulatory Visit: Payer: BLUE CROSS/BLUE SHIELD | Admitting: Family Medicine

## 2020-02-29 ENCOUNTER — Encounter: Payer: Self-pay | Admitting: Family Medicine

## 2020-02-29 ENCOUNTER — Ambulatory Visit (INDEPENDENT_AMBULATORY_CARE_PROVIDER_SITE_OTHER): Payer: 59 | Admitting: Family Medicine

## 2020-02-29 ENCOUNTER — Other Ambulatory Visit: Payer: Self-pay

## 2020-02-29 VITALS — BP 118/78 | HR 82 | Temp 97.9°F | Resp 14 | Ht 60.0 in | Wt 162.2 lb

## 2020-02-29 DIAGNOSIS — I700 Atherosclerosis of aorta: Secondary | ICD-10-CM | POA: Diagnosis not present

## 2020-02-29 DIAGNOSIS — R519 Headache, unspecified: Secondary | ICD-10-CM

## 2020-02-29 DIAGNOSIS — M791 Myalgia, unspecified site: Secondary | ICD-10-CM

## 2020-02-29 DIAGNOSIS — E1169 Type 2 diabetes mellitus with other specified complication: Secondary | ICD-10-CM | POA: Diagnosis not present

## 2020-02-29 DIAGNOSIS — R590 Localized enlarged lymph nodes: Secondary | ICD-10-CM

## 2020-02-29 DIAGNOSIS — R918 Other nonspecific abnormal finding of lung field: Secondary | ICD-10-CM

## 2020-02-29 DIAGNOSIS — E119 Type 2 diabetes mellitus without complications: Secondary | ICD-10-CM | POA: Diagnosis not present

## 2020-02-29 DIAGNOSIS — Z5181 Encounter for therapeutic drug level monitoring: Secondary | ICD-10-CM

## 2020-02-29 DIAGNOSIS — I739 Peripheral vascular disease, unspecified: Secondary | ICD-10-CM

## 2020-02-29 DIAGNOSIS — Z6831 Body mass index (BMI) 31.0-31.9, adult: Secondary | ICD-10-CM

## 2020-02-29 DIAGNOSIS — E785 Hyperlipidemia, unspecified: Secondary | ICD-10-CM

## 2020-02-29 DIAGNOSIS — E669 Obesity, unspecified: Secondary | ICD-10-CM

## 2020-02-29 MED ORDER — METFORMIN HCL ER 500 MG PO TB24: ORAL_TABLET | ORAL | 3 refills | Status: DC

## 2020-02-29 MED ORDER — ATORVASTATIN CALCIUM 10 MG PO TABS: ORAL_TABLET | ORAL | 3 refills | Status: DC

## 2020-02-29 NOTE — Patient Instructions (Signed)
WE need to get the CT of the chest done to follow up on incidental findings from last July - we will call you.  High Cholesterol  High cholesterol is a condition in which the blood has high levels of a white, waxy, fat-like substance (cholesterol). The human body needs small amounts of cholesterol. The liver makes all the cholesterol that the body needs. Extra (excess) cholesterol comes from the food that we eat. Cholesterol is carried from the liver by the blood through the blood vessels. If you have high cholesterol, deposits (plaques) may build up on the walls of your blood vessels (arteries). Plaques make the arteries narrower and stiffer. Cholesterol plaques increase your risk for heart attack and stroke. Work with your health care provider to keep your cholesterol levels in a healthy range. What increases the risk? This condition is more likely to develop in people who:  Eat foods that are high in animal fat (saturated fat) or cholesterol.  Are overweight.  Are not getting enough exercise.  Have a family history of high cholesterol. What are the signs or symptoms? There are no symptoms of this condition. How is this diagnosed? This condition may be diagnosed from the results of a blood test.  If you are older than age 51, your health care provider may check your cholesterol every 4-6 years.  You may be checked more often if you already have high cholesterol or other risk factors for heart disease. The blood test for cholesterol measures:  "Bad" cholesterol (LDL cholesterol). This is the main type of cholesterol that causes heart disease. The desired level for LDL is less than 100.  "Good" cholesterol (HDL cholesterol). This type helps to protect against heart disease by cleaning the arteries and carrying the LDL away. The desired level for HDL is 60 or higher.  Triglycerides. These are fats that the body can store or burn for energy. The desired number for triglycerides is lower  than 150.  Total cholesterol. This is a measure of the total amount of cholesterol in your blood, including LDL cholesterol, HDL cholesterol, and triglycerides. A healthy number is less than 200. How is this treated? This condition is treated with diet changes, lifestyle changes, and medicines. Diet changes  This may include eating more whole grains, fruits, vegetables, nuts, and fish.  This may also include cutting back on red meat and foods that have a lot of added sugar. Lifestyle changes  Changes may include getting at least 40 minutes of aerobic exercise 3 times a week. Aerobic exercises include walking, biking, and swimming. Aerobic exercise along with a healthy diet can help you maintain a healthy weight.  Changes may also include quitting smoking. Medicines  Medicines are usually given if diet and lifestyle changes have failed to reduce your cholesterol to healthy levels.  Your health care provider may prescribe a statin medicine. Statin medicines have been shown to reduce cholesterol, which can reduce the risk of heart disease. Follow these instructions at home: Eating and drinking If told by your health care provider:  Eat chicken (without skin), fish, veal, shellfish, ground [place] breast, and round or loin cuts of red meat.  Do not eat fried foods or fatty meats, such as hot dogs and salami.  Eat plenty of fruits, such as apples.  Eat plenty of vegetables, such as broccoli, potatoes, and carrots.  Eat beans, peas, and lentils.  Eat grains such as barley, rice, couscous, and bulgur wheat.  Eat pasta without cream sauces.  Use  skim or nonfat milk, and eat low-fat or nonfat yogurt and cheeses.  Do not eat or drink whole milk, cream, ice cream, egg yolks, or hard cheeses.  Do not eat stick margarine or tub margarines that contain trans fats (also called partially hydrogenated oils).  Do not eat saturated tropical oils, such as coconut oil and palm oil.  Do not  eat cakes, cookies, crackers, or other baked goods that contain trans fats.  General instructions  Exercise as directed by your health care provider. Increase your activity level with activities such as gardening, walking, and taking the stairs.  Take over-the-counter and prescription medicines only as told by your health care provider.  Do not use any products that contain nicotine or tobacco, such as cigarettes and e-cigarettes. If you need help quitting, ask your health care provider.  Keep all follow-up visits as told by your health care provider. This is important. Contact a health care provider if:  You are struggling to maintain a healthy diet or weight.  You need help to start on an exercise program.  You need help to stop smoking. Get help right away if:  You have chest pain.  You have trouble breathing. This information is not intended to replace advice given to you by your health care provider. Make sure you discuss any questions you have with your health care provider. Document Revised: 11/06/2017 Document Reviewed: 05/03/2016 Elsevier Patient Education  [employer].

## 2020-02-29 NOTE — Progress Notes (Signed)
Name: Kristine Alexander   MRN: [id number]    DOB: February 19, 1956   Date:02/29/2020       Progress Note  Chief Complaint  Patient presents with  . Follow-up  . Hyperlipidemia  . Diabetes     Subjective:   Kristine Alexander is a 64 y.o. female, presents to clinic for routine follow up on the conditions listed above.  Hyperlipidemia: Current Medication Regimen:   lipitor 10 mg - last visit had myalgias and HA  - CK was normal she was instructed to resume meds and f/up if any sx - no f/up or calls from pt.  Today she denies any sx, taking daily no concerns or SE  Last Lipids: Lab Results  Component Value Date   CHOL 144 08/17/2019   HDL 43 (L) 08/17/2019   LDLCALC 84 08/17/2019   TRIG 82 08/17/2019   CHOLHDL 3.3 08/17/2019   - Denies: Chest pain, shortness of breath, myalgias. - Documented aortic atherosclerosis? Yes - Risk factors for atherosclerosis: hypercholesterolemia and hypertension  Diabetes Mellitus Type II:  Unclear if prediabetes or T2DM, A1C in chart has only been noted as 5.9, none higher on metformin Currently managing with metformin 1000 mg once daily  Pt notes good med compliance Pt has no SE from meds. No cbg monitoring at home No hypoglycemic episodes Denies: Polyuria, polydipsia, polyphagia, vision changes, or neuropathy  Recent pertinent labs: Lab Results  Component Value Date   HGBA1C 5.9 (H) 08/17/2019   HGBA1C 5.9 (H) 08/20/2018   HGBA1C 5.9 (H) 02/18/2018    Pt is due for DM foot exam and eye exam - foot exam done today ACEI/ARB: No Statin: Yes   Patient Active Problem List   Diagnosis Date Noted  . Thoracic aorta atherosclerosis (HCC) 08/17/2019  . Hyperlipidemia associated with type 2 diabetes mellitus (HCC) 08/17/2019  . Mediastinal lymphadenopathy 08/17/2019  . Lung nodule, multiple 08/17/2019  . Aortic systolic murmur on examination 08/17/2019  . S/P cholecystectomy 08/17/2019  . Hepatic steatosis 03/22/2019  . Prediabetes 08/20/2017  .  Anemia 08/20/2017  . Vitamin B12 deficiency 08/20/2017  . Cardiac murmur 09/18/2015  . Obesity 05/16/2015  . Osteoarthrosis, generalized, multiple joints 05/16/2015  . Hyperlipidemia 05/16/2015    Past Surgical History:  Procedure Laterality Date  . CATARACT EXTRACTION W/PHACO Right 02/25/2016   Procedure: CATARACT EXTRACTION PHACO AND INTRAOCULAR LENS PLACEMENT (IOC);  Surgeon: Sallee Lange, MD;  Location: [facility] ORS;  Service: Ophthalmology;  Laterality: Right;  [place]    00:48.2 AP%   23.5 CDE   22.58 fluid pack lot # [id number]  . CATARACT EXTRACTION W/PHACO Left 06/08/2018   Procedure: CATARACT EXTRACTION PHACO AND INTRAOCULAR LENS PLACEMENT (IOC);  Surgeon: Galen Manila, MD;  Location: [facility] ORS;  Service: Ophthalmology;  Laterality: Left;  [place] 00:28.4 AP% 13.0 CDE 3.68 Fluid pack Lot # [id number]  . CHOLECYSTECTOMY N/A 05/19/2019   Procedure: LAPAROSCOPIC CHOLECYSTECTOMY;  Surgeon: Duanne Guess, MD;  Location: [facility] ORS;  Service: General;  Laterality: N/A;  . TUBAL LIGATION      Family History  Problem Relation Age of Onset  . Hyperlipidemia Mother   . Hyperlipidemia Father   . Hyperlipidemia Sister   . Diabetes Brother   . Hyperlipidemia Brother     Social History   Tobacco Use  . Smoking status: Never Smoker  . Smokeless tobacco: Never Used  Substance Use Topics  . Alcohol use: No    Alcohol/week: 0.0 standard drinks  . Drug use: No  Current Outpatient Medications:  .  atorvastatin (LIPITOR) 10 MG tablet, TAKE 1 TABLET BY MOUTH DAILY, Disp: 90 tablet, Rfl: 0 .  metFORMIN (GLUCOPHAGE-XR) 500 MG 24 hr tablet, TAKE 2 TABLETS(1000 MG) BY MOUTH DAILY, Disp: 180 tablet, Rfl: 0  No Known Allergies  Chart Review Today: I personally reviewed active problem list, medication list, allergies, family history, social history, health maintenance, notes from last encounter, lab results, imaging with the patient/caregiver today.   Review of Systems  10 Systems  reviewed and are negative for acute change except as noted in the HPI.  Objective:    Vitals:   02/29/20 1147  BP: 118/78  Pulse: 82  Resp: 14  Temp: 97.9 F (36.6 C)  SpO2: 97%  Weight: 162 lb 3.2 oz (73.6 kg)  Height: 5' (1.524 m)    Body mass index is 31.68 kg/m.  Physical Exam Vitals and nursing note reviewed.  Constitutional:      General: She is not in acute distress.    Appearance: Normal appearance. She is well-developed. She is obese. She is not ill-appearing, toxic-appearing or diaphoretic.     Interventions: Face mask in place.  HENT:     Head: Normocephalic and atraumatic.     Right Ear: External ear normal.     Left Ear: External ear normal.  Eyes:     General: Lids are normal. No scleral icterus.       Right eye: No discharge.        Left eye: No discharge.     Conjunctiva/sclera: Conjunctivae normal.  Neck:     Trachea: Phonation normal. No tracheal deviation.  Cardiovascular:     Rate and Rhythm: Normal rate and regular rhythm.     Pulses: Normal pulses.          Radial pulses are 2+ on the right side and 2+ on the left side.       Dorsalis pedis pulses are 2+ on the right side and 2+ on the left side.       Posterior tibial pulses are 2+ on the right side and 2+ on the left side.     Heart sounds: Normal heart sounds. No murmur. No friction rub. No gallop.   Pulmonary:     Effort: Pulmonary effort is normal. No respiratory distress.     Breath sounds: Normal breath sounds. No stridor. No wheezing, rhonchi or rales.  Chest:     Chest wall: No tenderness.  Abdominal:     General: Bowel sounds are normal. There is no distension.     Palpations: Abdomen is soft.     Tenderness: There is no abdominal tenderness. There is no guarding or rebound.  Musculoskeletal:        General: No deformity. Normal range of motion.     Cervical back: Normal range of motion and neck supple.     Right lower leg: No edema.     Left lower leg: No edema.     Comments:  No calf tenderness to palpation muscle compartments soft  Lymphadenopathy:     Cervical: No cervical adenopathy.  Skin:    General: Skin is warm and dry.     Capillary Refill: Capillary refill takes less than 2 seconds.     Coloration: Skin is not jaundiced or pale.     Findings: No rash.  Neurological:     Mental Status: She is alert.     Motor: No abnormal muscle tone.     Gait:  Gait normal.  Psychiatric:        Mood and Affect: Mood normal.        Speech: Speech normal.        Behavior: Behavior normal.       Diabetic Foot Exam: Diabetic Foot Exam - Simple   Simple Foot Form Diabetic Foot exam was performed with the following findings: Yes 02/29/2020 11:45 AM  Visual Inspection Sensation Testing Pulse Check Comments     PHQ2/9: Depression screen [facility] 2/9 02/29/2020 11/17/2019 08/17/2019 02/21/2019 01/20/2019  Decreased Interest 0 0 0 0 0  Down, Depressed, Hopeless 0 0 0 0 0  PHQ - 2 Score 0 0 0 0 0  Altered sleeping 0 0 0 0 0  Tired, decreased energy 0 0 0 0 0  Change in appetite 0 0 0 0 0  Feeling bad or failure about yourself  0 0 0 0 0  Trouble concentrating 0 0 0 0 0  Moving slowly or fidgety/restless 0 0 0 0 0  Suicidal thoughts 0 0 0 0 0  PHQ-9 Score 0 0 0 0 0  Difficult doing work/chores Not difficult at all Not difficult at all Not difficult at all Not difficult at all Not difficult at all  Some recent data might be hidden    phq 9 is neg, reviewed  Fall Risk: Fall Risk  02/29/2020 11/17/2019 08/17/2019 03/03/2019 02/21/2019  Falls in the past year? 0 0 0 0 0  Number falls in past yr: 0 0 0 - 0  Injury with Fall? 0 0 0 - 0  Follow up - - - - -    Functional Status Survey: Is the patient deaf or have difficulty hearing?: No Does the patient have difficulty seeing, even when wearing glasses/contacts?: No Does the patient have difficulty concentrating, remembering, or making decisions?: No Does the patient have difficulty walking or climbing stairs?: No Does  the patient have difficulty dressing or bathing?: No Does the patient have difficulty doing errands alone such as visiting a doctor's office or shopping?: No   Assessment & Plan:     ICD-10-CM   1. Hyperlipidemia associated with type 2 diabetes mellitus (HCC)  E11.69 Lipid panel   E78.5 COMPLETE METABOLIC PANEL WITH GFR    US ARTERIAL ABI (SCREENING LOWER EXTREMITY)   Patient has resumed Lipitor had no return or worsening of symptoms, compliant no concerns, recheck labs  2. Type 2 diabetes mellitus without complication, without long-term current use of insulin (HCC)  E11.9 Hemoglobin A1c    Lipid panel    COMPLETE METABOLIC PANEL WITH GFR    Microalbumin, urine   Compliant with Metformin 1000 mg once daily no side effects or concerns foot exam done today, do suspect she has prediabetes, well controlled  3. Thoracic aorta atherosclerosis (HCC)  I70.0 Lipid panel    COMPLETE METABOLIC PANEL WITH GFR   On statin, blood pressure at goal with lifestyle efforts, monitoring  4. Myalgia  M79.10    Prior report of myalgias have improved but she does note leg and calf pain which is different than her prior symptoms, see below  5. Acute nonintractable headache, unspecified headache type  R51.9    Her headache resolved  6. Class 1 obesity with serious comorbidity and body mass index (BMI) of 31.0 to 31.9 in adult, unspecified obesity type  E66.9    Z68.31    Patient has lost some weight is working on diet and exercising  7. Claudication of calf muscles (  HCC)  I73.9 US ARTERIAL ABI (SCREENING LOWER EXTREMITY)   Patient has been trying to exercise she does endorse pain in calves with physical activity and longer walks - we will do ABI screening - vasc if +  8. Medication monitoring encounter  Z51.81 Hemoglobin A1c    Lipid panel    COMPLETE METABOLIC PANEL WITH GFR    CBC with Differential/Platelet    Microalbumin, urine  9. Mass of upper lobe of lung  R91.8    Nodular incidental finding last  July to right upper lung fields I reviewed with patient and her daughter previously, CT not done will follow up on that, no sx  10. Mediastinal lymphadenopathy  R59.0   11. Lung nodule, multiple  R91.8    I again reviewed with the patient and her daughter her prior findings on CT scan which was done July 2020 when she presented to the ER following cholecystectomy.  CT angio showed incidental finding of lung nodule/lung mass and mediastinal lymphadenopathy, this follow-up CT scan was ordered after lengthy discussion at our last appointment, today is reviewed and has still not yet been completed, will be forwarding to clinic administrator to follow-up on scheduling?  Patient and her daughter report that they did not get any calls from anywhere to schedule this CT scan report from July was reviewed with the patient today again, it did advise a 3 to 36-month follow-up scan and then periodic monitoring, patient will also be referred to the lung nodule program   Return for 6 month CPE.   Delsa Grana, PA-C 02/29/20 11:55 AM

## 2020-03-01 ENCOUNTER — Other Ambulatory Visit: Payer: Self-pay | Admitting: Family Medicine

## 2020-03-01 DIAGNOSIS — D509 Iron deficiency anemia, unspecified: Secondary | ICD-10-CM

## 2020-03-03 LAB — LIPID PANEL
Cholesterol: 128 mg/dL (ref <200)
HDL: 45 mg/dL — ABNORMAL LOW (ref >=50)
LDL Cholesterol (Calc): 70 mg/dL (calc)
Non-HDL Cholesterol (Calc): 83 mg/dL (calc) (ref <130)
Total CHOL/HDL Ratio: 2.8 (calc) (ref <5.0)
Triglycerides: 57 mg/dL (ref <150)

## 2020-03-03 LAB — COMPLETE METABOLIC PANEL WITH GFR
AG Ratio: 1.3 (calc) (ref 1.0–2.5)
ALT: 14 U/L (ref 6–29)
AST: 19 U/L (ref 10–35)
Albumin: 4 g/dL (ref 3.6–5.1)
Alkaline phosphatase (APISO): 56 U/L (ref 37–153)
BUN: 14 mg/dL (ref 7–25)
CO2: 27 mmol/L (ref 20–32)
Calcium: 9.5 mg/dL (ref 8.6–10.4)
Chloride: 103 mmol/L (ref 98–110)
Creat: 0.58 mg/dL (ref 0.50–0.99)
GFR, Est African American: 113 mL/min/{1.73_m2} (ref >=60)
GFR, Est Non African American: 97 mL/min/{1.73_m2} (ref >=60)
Globulin: 3.1 g/dL (calc) (ref 1.9–3.7)
Glucose, Bld: 86 mg/dL (ref 65–99)
Potassium: 4.2 mmol/L (ref 3.5–5.3)
Sodium: 138 mmol/L (ref 135–146)
Total Bilirubin: 0.3 mg/dL (ref 0.2–1.2)
Total Protein: 7.1 g/dL (ref 6.1–8.1)

## 2020-03-03 LAB — MICROALBUMIN, URINE: Microalb, Ur: <0.2 mg/dL

## 2020-03-03 LAB — TEST AUTHORIZATION

## 2020-03-03 LAB — HEMOGLOBIN A1C
Hgb A1c MFr Bld: 5.8 % of total Hgb — ABNORMAL HIGH (ref <5.7)
Mean Plasma Glucose: 120 (calc)
eAG (mmol/L): 6.6 (calc)

## 2020-03-03 LAB — CBC WITH DIFFERENTIAL/PLATELET
Absolute Monocytes: 540 cells/uL (ref 200–950)
Basophils Absolute: 38 cells/uL (ref 0–200)
Basophils Relative: 0.5 %
Eosinophils Absolute: 152 cells/uL (ref 15–500)
Eosinophils Relative: 2 %
HCT: 34.9 % — ABNORMAL LOW (ref 35.0–45.0)
Hemoglobin: 11.3 g/dL — ABNORMAL LOW (ref 11.7–15.5)
Lymphs Abs: 2500 cells/uL (ref 850–3900)
MCH: 25.3 pg — ABNORMAL LOW (ref 27.0–33.0)
MCHC: 32.4 g/dL (ref 32.0–36.0)
MCV: 78.1 fL — ABNORMAL LOW (ref 80.0–100.0)
MPV: 9.9 fL (ref 7.5–12.5)
Monocytes Relative: 7.1 %
Neutro Abs: 4370 cells/uL (ref 1500–7800)
Neutrophils Relative %: 57.5 %
Platelets: 286 10*3/uL (ref 140–400)
RBC: 4.47 10*6/uL (ref 3.80–5.10)
RDW: 14.6 % (ref 11.0–15.0)
Total Lymphocyte: 32.9 %
WBC: 7.6 10*3/uL (ref 3.8–10.8)

## 2020-03-03 LAB — IRON,TIBC AND FERRITIN PANEL
%SAT: 16 % (calc) (ref 16–45)
Ferritin: 33 ng/mL (ref 16–288)
Iron: 57 ug/dL (ref 45–160)
TIBC: 348 mcg/dL (calc) (ref 250–450)

## 2020-03-06 ENCOUNTER — Other Ambulatory Visit: Payer: Self-pay

## 2020-03-06 ENCOUNTER — Ambulatory Visit
Admission: RE | Admit: 2020-03-06 | Discharge: 2020-03-06 | Disposition: A | Discharge status: Home / Self Care | Payer: 59 | Source: Ambulatory Visit | Attending: Family Medicine | Admitting: Family Medicine

## 2020-03-06 DIAGNOSIS — E1169 Type 2 diabetes mellitus with other specified complication: Secondary | ICD-10-CM | POA: Insufficient documentation

## 2020-03-06 DIAGNOSIS — I739 Peripheral vascular disease, unspecified: Secondary | ICD-10-CM | POA: Diagnosis present

## 2020-03-06 DIAGNOSIS — E785 Hyperlipidemia, unspecified: Secondary | ICD-10-CM | POA: Insufficient documentation

## 2020-03-08 ENCOUNTER — Other Ambulatory Visit: Payer: Self-pay | Admitting: Oncology

## 2020-03-08 DIAGNOSIS — R918 Other nonspecific abnormal finding of lung field: Secondary | ICD-10-CM

## 2020-03-08 NOTE — Progress Notes (Signed)
  Pulmonary Nodule Clinic Telephone Note [facility]   Received referral from [place], [place].   HPI: Patient is a 64 year old female with past medical history for hyperlipidemia, aortic systolic murmur, hepatic steatosis, prediabetes, anemia, obesity, osteoarthrosis and more recently cholelithiasis requiring cholecystectomy.  CT abdomen pelvis revealed a nodular-like structure in right upper lobe that warranted follow-up.  Review and Recommendations: I personally reviewed all patient's previous imaging including CT abdomen/pelvis w contrast from 05/31/2019 which revealed an incidental finding of nodular structures around the posterior aspect of the right upper lobe.  Findings most likely represent post infection or postinflammatory changes but indeterminate and no comparison images at a local for review. Recomened repeat CT chest in 3-6 months to confirm stability.   I recommend follow-up with noncontrast CT scan of the chest in 1 week.   Social History: Patient is non-smoker. Never smoked.   High risk factors include: History of heavy smoking, exposure to asbestos, radium or uranium, personal family history of lung cancer, older age, sex (females greater than males), race (black and native [place] greater than weight), marginal speculation, upper lobe location, multiplicity (less than 5 nodules increases risk for malignancy) and emphysema and/or pulmonary fibrosis.   This recommendation follows the consensus statement: Guidelines for Management of Incidental Pulmonary Nodules Detected on CT Images: From the Fleischner Society 2017; Radiology 2017; 284:228-243.    I have placed order for CT scan without contrast to be completed in 1 week.   Disposition: Order placed for repeat CT chest. Will notify Micael Hampshire in scheduling. [address] to call patient with appointment date and time. Return to pulmonary nodule clinic a few days after his repeat imaging to discuss results and  plan moving forward.  Durenda Hurt, NP 03/08/2020 11:36 AM

## 2020-03-12 ENCOUNTER — Telehealth: Payer: Self-pay | Admitting: *Deleted

## 2020-03-12 NOTE — Telephone Encounter (Signed)
Referral received for pt to be seen in Lung Nodule Clinic for further workup of incidental lung nodule with follow up CT scan. Left message with patient to call back to discuss clinic and review recommendations and upcoming appts including follow up CT scan and visit with Jenny Burns, NP to discuss results. Awaiting call back.  

## 2020-03-15 ENCOUNTER — Ambulatory Visit: Payer: 59

## 2020-03-16 ENCOUNTER — Inpatient Hospital Stay: Payer: 59 | Admitting: Oncology

## 2020-03-20 ENCOUNTER — Other Ambulatory Visit: Payer: Self-pay

## 2020-03-20 ENCOUNTER — Other Ambulatory Visit: Payer: 59

## 2020-03-20 ENCOUNTER — Ambulatory Visit: Payer: 59

## 2020-03-20 ENCOUNTER — Ambulatory Visit
Admission: RE | Admit: 2020-03-20 | Discharge: 2020-03-20 | Disposition: A | Discharge status: Home / Self Care | Payer: 59 | Source: Ambulatory Visit | Attending: Oncology | Admitting: Oncology

## 2020-03-20 DIAGNOSIS — R918 Other nonspecific abnormal finding of lung field: Secondary | ICD-10-CM

## 2020-03-21 ENCOUNTER — Ambulatory Visit: Payer: 59 | Admitting: Oncology

## 2020-03-23 ENCOUNTER — Other Ambulatory Visit: Payer: Self-pay

## 2020-03-23 ENCOUNTER — Ambulatory Visit: Payer: 59 | Admitting: Oncology

## 2020-03-23 ENCOUNTER — Inpatient Hospital Stay: Payer: 59 | Attending: Oncology | Admitting: Oncology

## 2020-03-23 DIAGNOSIS — Z79899 Other long term (current) drug therapy: Secondary | ICD-10-CM | POA: Insufficient documentation

## 2020-03-23 DIAGNOSIS — E669 Obesity, unspecified: Secondary | ICD-10-CM | POA: Diagnosis not present

## 2020-03-23 DIAGNOSIS — R918 Other nonspecific abnormal finding of lung field: Secondary | ICD-10-CM

## 2020-03-23 DIAGNOSIS — K760 Fatty (change of) liver, not elsewhere classified: Secondary | ICD-10-CM | POA: Insufficient documentation

## 2020-03-23 DIAGNOSIS — E119 Type 2 diabetes mellitus without complications: Secondary | ICD-10-CM | POA: Diagnosis not present

## 2020-03-23 NOTE — Progress Notes (Signed)
Pulmonary Nodule Clinic Consult note [facility]  [phone] [phone]  Patient Care Team: Danelle Berryapia, Leisa, Cordelia PochePA-C as PCP - General (Family Medicine)   Name of the patient: Kristine Alexander  [id number]  September 20, 1956   Date of visit: 03/23/2020   Diagnosis- Lung Nodule  Chief complaint/ Reason for visit- Pulmonary Nodule Clinic Initial Visit  Past Medical History:  Patient is managed/referred by Henry RusselLesia Tapia, PA.   Patient is a 64 year old female with past medical history for hyperlipidemia, aortic systolic murmur, hepatic steatosis, prediabetes, anemia, obesity, osteoarthrosis and more recently cholelithiasis requiring cholecystectomy.  CT abdomen pelvis revealed a nodular-like structure in right upper lobe that warranted follow-up.  I personally reviewed all of patient's imaging including CT abdomen pelvis with contrast from 05/31/2019 which revealed an incidental finding of nodular structures around the posterior aspect of the right upper lobe.  Findings most likely representing post infection of postinflammatory changes but indeterminate and they were no comparison images for review.  Repeat CT chest recommended in 3 to 6 months to confirm stability.  Interval history-patient presents today to review CT scan from 03/22/2020.  Per review of past medical history she has never smoked or been exposed to second hand smoke.  She denies any previous work exposures that could predispose her to lung cancer.  She denies any personal or family history of cancer.  She currently is doing well.  She lives at home with her husband.  She is very active.  She continues to cook all meals for her family and cleans the house.  Her daughter states "she needs to slow down".  She is currently retired.  She has not worked in many years.  She denies any respiratory concerns.  She denies chest pain or shortness of breath.  She denies any diarrhea or constipation.  ECOG FS:0 -  Asymptomatic  Review of systems- Review of Systems  Constitutional: Negative.  Negative for chills, fever, malaise/fatigue and weight loss.  HENT: Negative for congestion, ear pain and tinnitus.   Eyes: Negative.  Negative for blurred vision and double vision.  Respiratory: Negative.  Negative for cough, sputum production and shortness of breath.   Cardiovascular: Negative.  Negative for chest pain, palpitations and leg swelling.  Gastrointestinal: Negative.  Negative for abdominal pain, constipation, diarrhea, nausea and vomiting.  Genitourinary: Negative for dysuria, frequency and urgency.  Musculoskeletal: Negative for back pain and falls.  Skin: Negative.  Negative for rash.  Neurological: Negative.  Negative for weakness and headaches.  Endo/Heme/Allergies: Negative.  Does not bruise/bleed easily.  Psychiatric/Behavioral: Negative.  Negative for depression. The patient is not nervous/anxious and does not have insomnia.      No Known Allergies   Past Medical History:  Diagnosis Date  . Cholelithiases 03/22/2019  . Diabetes mellitus without complication (HCC)   . Hyperlipidemia   . Iron deficiency anemia   . Microcytosis 03/01/2017   chronic  . Numbness 08/18/2017  . Osteoarthritis of multiple joints   . Prediabetes 08/20/2017     Past Surgical History:  Procedure Laterality Date  . CATARACT EXTRACTION W/PHACO Right 02/25/2016   Procedure: CATARACT EXTRACTION PHACO AND INTRAOCULAR LENS PLACEMENT (IOC);  Surgeon: Sallee LangeSteven Dingeldein, MD;  Location: [facility] ORS;  Service: Ophthalmology;  Laterality: Right;  US    00:48.2 AP%   23.5 CDE   22.58 fluid pack lot # [id number]  . CATARACT EXTRACTION W/PHACO Left 06/08/2018   Procedure: CATARACT EXTRACTION PHACO AND INTRAOCULAR LENS PLACEMENT (IOC);  Surgeon: Galen ManilaPorfilio, William,  MD;  Location: [facility] ORS;  Service: Ophthalmology;  Laterality: Left;  [place] 00:28.4 AP% 13.0 CDE 3.68 Fluid pack Lot # [id number]  . CHOLECYSTECTOMY N/A 05/19/2019    Procedure: LAPAROSCOPIC CHOLECYSTECTOMY;  Surgeon: Fredirick Maudlin, MD;  Location: [facility] ORS;  Service: General;  Laterality: N/A;  . TUBAL LIGATION      Social History   Socioeconomic History  . Marital status: Married    Spouse name: Not on file  . Number of children: Not on file  . Years of education: Not on file  . Highest education level: Not on file  Occupational History  . Not on file  Tobacco Use  . Smoking status: Never Smoker  . Smokeless tobacco: Never Used  Substance and Sexual Activity  . Alcohol use: No    Alcohol/week: 0.0 standard drinks  . Drug use: No  . Sexual activity: Not Currently  Other Topics Concern  . Not on file  Social History Narrative  . Not on file   Social Determinants of Health   Financial Resource Strain:   . Difficulty of Paying Living Expenses:   Food Insecurity:   . Worried About Charity fundraiser in the Last Year:   . Arboriculturist in the Last Year:   Transportation Needs:   . Film/video editor (Medical):   Marland Kitchen Lack of Transportation (Non-Medical):   Physical Activity:   . Days of Exercise per Week:   . Minutes of Exercise per Session:   Stress:   . Feeling of Stress :   Social Connections:   . Frequency of Communication with Friends and Family:   . Frequency of Social Gatherings with Friends and Family:   . Attends Religious Services:   . Active Member of Clubs or Organizations:   . Attends Archivist Meetings:   Marland Kitchen Marital Status:   Intimate Partner Violence:   . Fear of Current or Ex-Partner:   . Emotionally Abused:   Marland Kitchen Physically Abused:   . Sexually Abused:     Family History  Problem Relation Age of Onset  . Hyperlipidemia Mother   . Hyperlipidemia Father   . Hyperlipidemia Sister   . Diabetes Brother   . Hyperlipidemia Brother      Current Outpatient Medications:  .  atorvastatin (LIPITOR) 10 MG tablet, TAKE 1 TABLET BY MOUTH DAILY, Disp: 90 tablet, Rfl: 3 .  metFORMIN (GLUCOPHAGE-XR) 500  MG 24 hr tablet, TAKE 2 TABLETS(1000 MG) BY MOUTH DAILY, Disp: 180 tablet, Rfl: 3  Physical exam: There were no vitals filed for this visit. Physical Exam Constitutional:      Appearance: Normal appearance.  HENT:     Head: Normocephalic and atraumatic.  Eyes:     Pupils: Pupils are equal, round, and reactive to light.  Cardiovascular:     Rate and Rhythm: Normal rate and regular rhythm.     Heart sounds: Normal heart sounds. No murmur.  Pulmonary:     Effort: Pulmonary effort is normal.     Breath sounds: Normal breath sounds. No wheezing.  Abdominal:     General: Bowel sounds are normal. There is no distension.     Palpations: Abdomen is soft.     Tenderness: There is no abdominal tenderness.  Musculoskeletal:        General: Normal range of motion.     Cervical back: Normal range of motion.  Skin:    General: Skin is warm and dry.     Findings: No rash.  Neurological:     Mental Status: She is alert and oriented to person, place, and time.  Psychiatric:        Judgment: Judgment normal.      CMP Latest Ref Rng & Units 02/29/2020  Glucose 65 - 99 mg/dL 86  BUN 7 - 25 mg/dL 14  Creatinine 9.79 - 4.80 mg/dL 1.65  Sodium 537 - 482 mmol/L 138  Potassium 3.5 - 5.3 mmol/L 4.2  Chloride 98 - 110 mmol/L 103  CO2 20 - 32 mmol/L 27  Calcium 8.6 - 10.4 mg/dL 9.5  Total Protein 6.1 - 8.1 g/dL 7.1  Total Bilirubin 0.2 - 1.2 mg/dL 0.3  Alkaline Phos 38 - 126 U/L -  AST 10 - 35 U/L 19  ALT 6 - 29 U/L 14   CBC Latest Ref Rng & Units 02/29/2020  WBC 3.8 - 10.8 Thousand/uL 7.6  Hemoglobin 11.7 - 15.5 g/dL 11.3(L)  Hematocrit 35.0 - 45.0 % 34.9(L)  Platelets 140 - 400 Thousand/uL 286    No images are attached to the encounter.  CT Chest Wo Contrast  Result Date: 03/21/2020 CLINICAL DATA:  Follow up lung nodule. EXAM: CT CHEST WITHOUT CONTRAST TECHNIQUE: Multidetector CT imaging of the chest was performed following the standard protocol without IV contrast. COMPARISON:   05/31/2019 FINDINGS: Cardiovascular: Atherosclerotic calcification of the aortic arch and descending thoracic aorta. Mediastinum/Nodes: Faintly calcified small AP window and bilateral hilar and infrahilar lymph nodes. Subcarinal node 1.4 cm in short axis on image 66/2, previously 1.3 cm by my measurements. Left hilar node 0.9 cm short axis on image 58/2, stable. Right infrahilar node 0.7 cm in short axis on image 78/2, previously 0.8 cm by my measurements. Lungs/Pleura: Pleural-based nodularity in the right upper lobe, the largest of the 3 nodules in this vicinity measuring 8 by 6 by 5 mm (volume = 100 mm^3) on image 34/3, previously measuring 8 by 5 by 6 mm (volume = 100 mm^3) by my measurements. A second pleural based nodule measures 0.5 by 0.4 cm on image 25/3, previously the same. A third more medial nodule is pleural-based and measures 0.4 cm in diameter on image 28/3, previously the same. Parenchymal nodularity in the right upper lobe noted on image 34/3, the nodule in this vicinity measures 0.5 cm transverse, previously the same. 3 mm left upper lobe nodule on image 60/3 is slightly better shown on today's exam but believed to been present previously Upper Abdomen: Cholecystectomy. Musculoskeletal: Thoracic spondylosis. IMPRESSION: 1. The dominant pleural-based nodularity in the right upper lobe is unchanged in volume compared to the prior exam, currently 8 by 6 by 5 mm, previously 8 by 5 by 6 mm. Stable small additional bilateral pulmonary nodules. If the patient is low risk for lung cancer, then consider additional CT in 8-14 months. If the patient is considered high risk for lung cancer, then CT in 8-14 months would be formerly recommended. This recommendation follows the consensus statement: Guidelines for Management of Incidental Pulmonary Nodules Detected on CT Images: From the Fleischner Society 2017; Radiology 2017; 284:228-243. 2. Stable borderline enlarged subcarinal lymph node, 1.4 cm in short  axis, previously 1.3 cm by my measurements. 3. Faintly calcified small AP window and bilateral hilar and infrahilar lymph nodes, compatible with prior granulomatous disease. 4. Aortic atherosclerosis. Aortic Atherosclerosis (ICD10-I70.0). Electronically Signed   By: Gaylyn Rong M.D.   On: 03/21/2020 09:03   US ARTERIAL ABI (SCREENING LOWER EXTREMITY)  Result Date: 03/06/2020 CLINICAL DATA:  64 year old female with calf  pain and claudication EXAM: NONINVASIVE PHYSIOLOGIC VASCULAR STUDY OF BILATERAL LOWER EXTREMITIES TECHNIQUE: Evaluation of both lower extremities was performed at rest, including calculation of ankle-brachial indices, multiple segmental pressure evaluation, segmental Doppler and segmental pulse volume recording. COMPARISON:  None. FINDINGS: Right ABI:  1.04 Left ABI:  1.03 Right Lower Extremity: Segmental Doppler at the right ankle demonstrates triphasic waveforms Left Lower Extremity: Segmental Doppler at the left ankle demonstrates triphasic waveforms IMPRESSION: Resting ABI the bilateral lower extremity within normal limits with the segmental exam demonstrating no significant arterial occlusive disease Signed, Yvone Neu. Reyne Dumas, RPVI [facility] Radiology Electronically Signed   By: Gilmer Mor D.O.   On: 03/06/2020 16:11    Assessment and plan- Patient is a 64 y.o. female who presents to pulmonary nodule clinic for follow-up of incidental lung nodules.     CT chest without contrast from 03/20/2020 revealed dominant pleural-based nodularity in the right upper lobe remains unchanged from prior exam currently 8 x 6 x 5 mm in size.  Stable small additional bilateral pulmonary nodules.  If the patient is low risk for lung cancer then consider additional CT scan in 8 to 14 months.   Calculating malignancy probability of a pulmonary nodule: Risk factors include: 1.  Age. 2.  Cancer history. 3.  Diameter of pulmonary nodule and mm 4.   Location 5.  Smoking history 6.  Spiculation present   Based on risk factors, this patient is low risk for the development of lung cancer given she has never smoked.  She has know known risk factors for the development of lung cancer. I would recommend a repeat CT chest without contrast in approximately 1 year.  At that point, if lung nodules are stable, will dismiss from our lung nodule clinic.  She does not meet criteria for low-dose CT screening given she has never smoked.   During our visit, we discussed pulmonary nodules are a common incidental finding and are often how lung cancer is discovered.  Lung cancer survival is directly related to the stage at diagnosis.  We discussed that nodules can vary in presentation from solitary pulmonary nodules to masses, 2 groundglass opacities and multiple nodules.  Pulmonary nodules in the majority of cases are benign but the probability of these becoming malignant cannot be undermined.  Early identification of malignant nodules could lead to early diagnosis and increased survival.   We discussed the probability of pulmonary nodules becoming malignant increase with age, pack years of tobacco use, size/characteristics of the nodule and location; with upper lobe involvement being most worrisome.   We discussed the goal of our clinic is to thoroughly evaluate each nodule, developed a comprehensive, individualized plan of care utilizing the most advanced technology and significantly reduce the time from detection to treatment.  A dedicated pulmonary nodule clinic has proven to indeed expedite the detection and treatment of lung cancer.   Patient education in fact sheet provided along with most recent CT scans.  Plan:  Reviewed recent CT chest without contrast with patient and daughter. All questions were answered. Reviewed family and personal history. Reviewed previous occupational history.  Disposition: RTC in 1 year for repeat CT chest without contrast  if stable at that time can be discharged from our clinic.   Visit Diagnosis 1. Mass of upper lobe of lung     Patient expressed understanding and was in agreement with this plan. She also understands that She can call clinic at any time with any  questions, concerns, or complaints.   Greater than 50% was spent in counseling and coordination of care with this patient including but not limited to discussion of the relevant topics above (See A&P) including, but not limited to diagnosis and management of acute and chronic medical conditions.   Thank you for allowing me to participate in the care of this very pleasant patient.    Mauro Kaufmann, NP [facility] Cell - [phone] Pager- [phone] 04/02/2020 3:16 PM

## 2020-03-29 LAB — HM DIABETES EYE EXAM

## 2020-06-02 IMAGING — US ULTRASOUND ABDOMEN LIMITED
1 series · 14 of 25 positions shown · non-contrast
Comparison: None.

CLINICAL DATA: Right upper quadrant abdominal pain

EXAM:
ULTRASOUND ABDOMEN LIMITED RIGHT UPPER QUADRANT

[Series 1: ultrasound abdomen limited · 0.20mm/px · 14 of 57 slices shown]
[im 1/57]
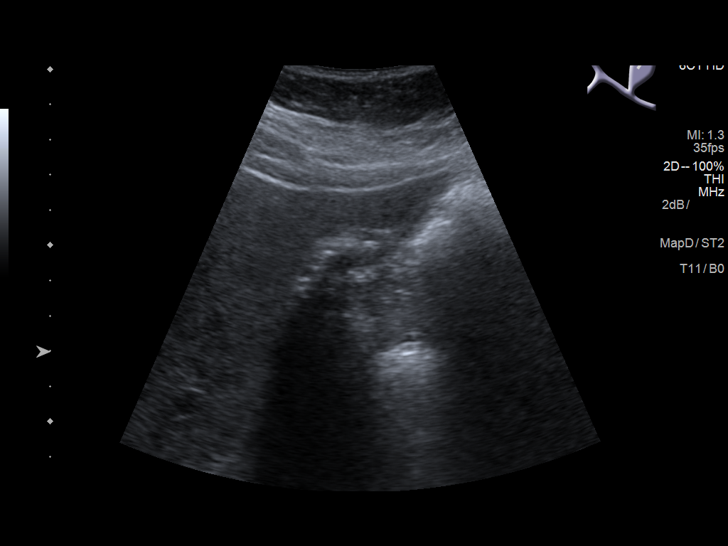
[im 5/57]
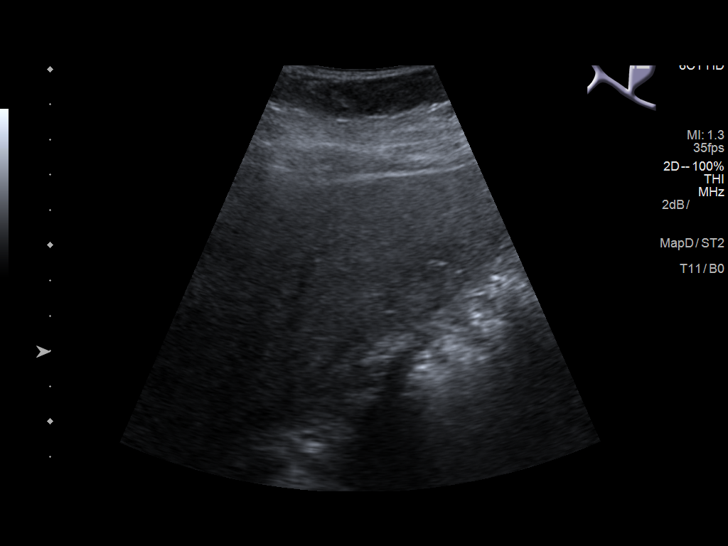
[im 10/57]
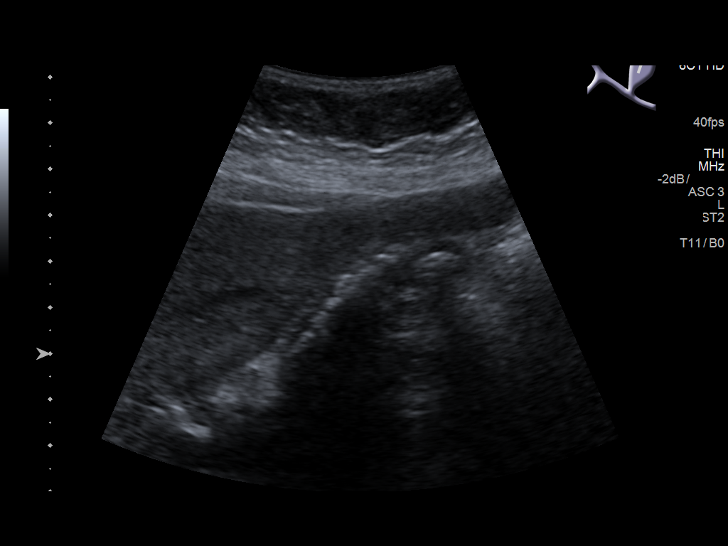
[im 15/57]
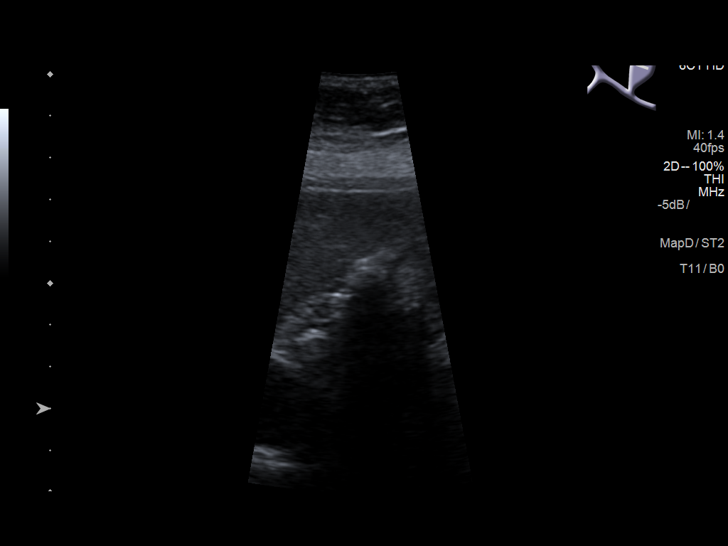
[im 19/57]
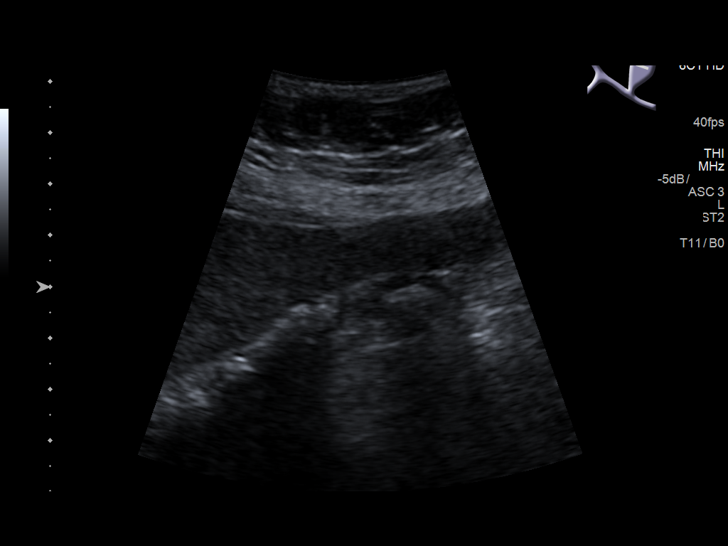
[im 22/57]
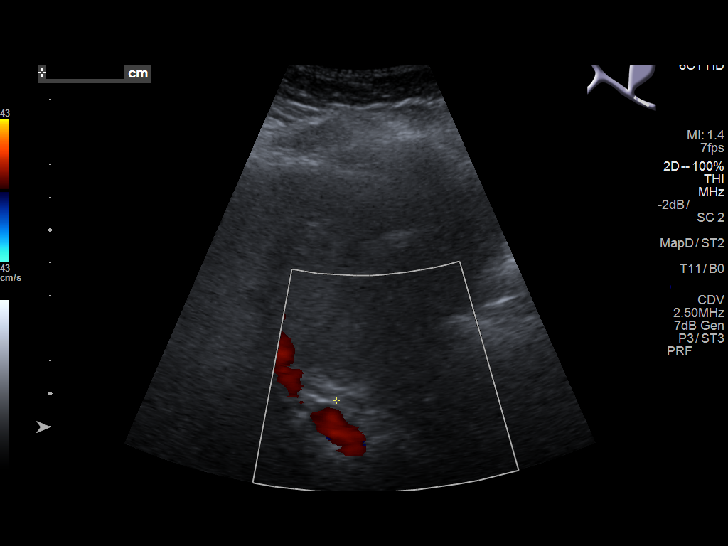
[im 26/57]
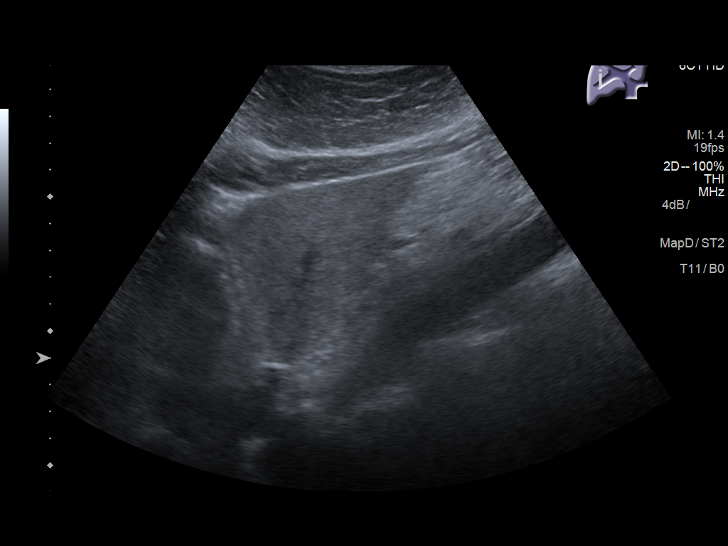
[im 31/57]
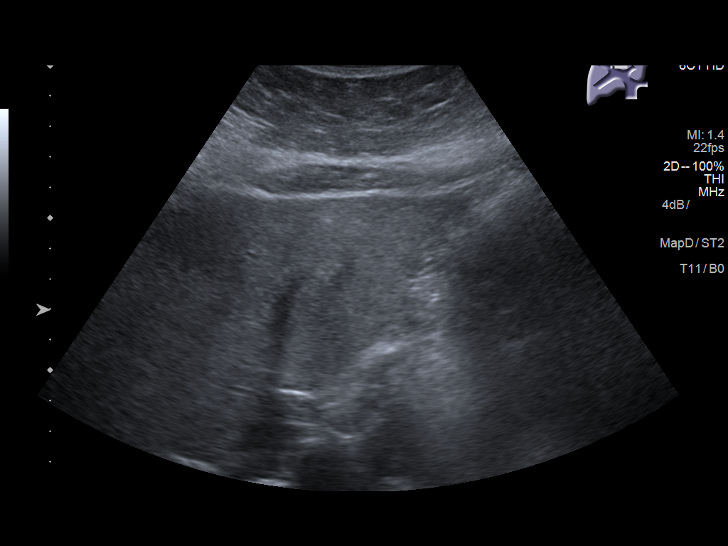
[im 36/57]
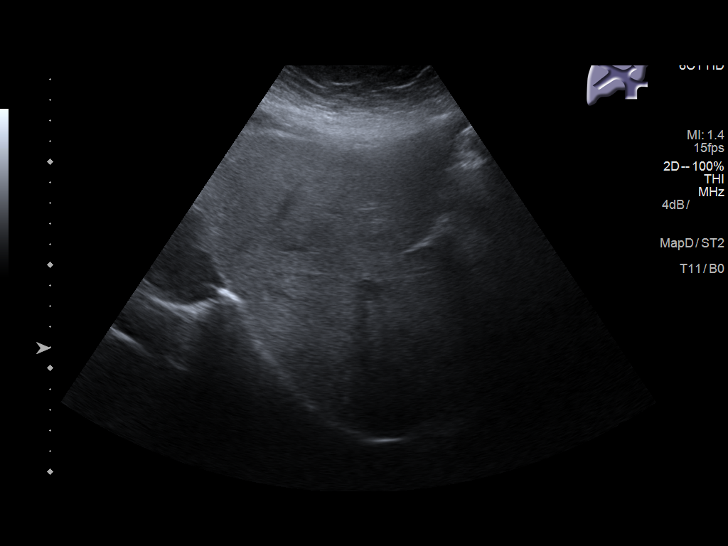
[im 38/57]
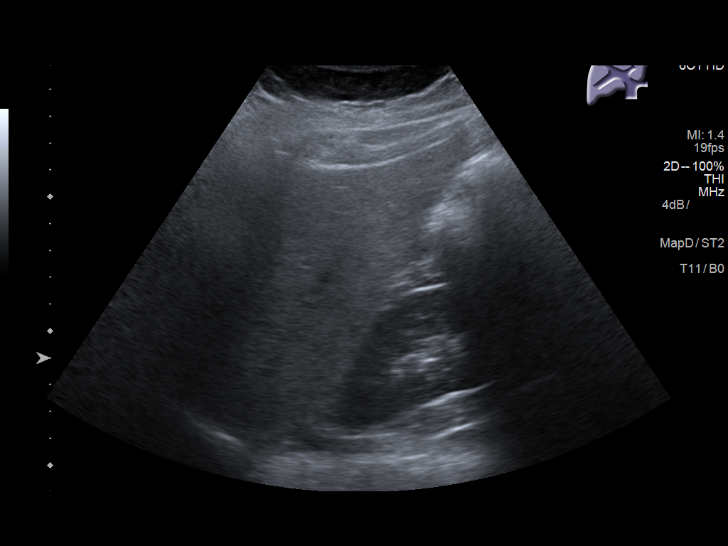
[im 43/57]
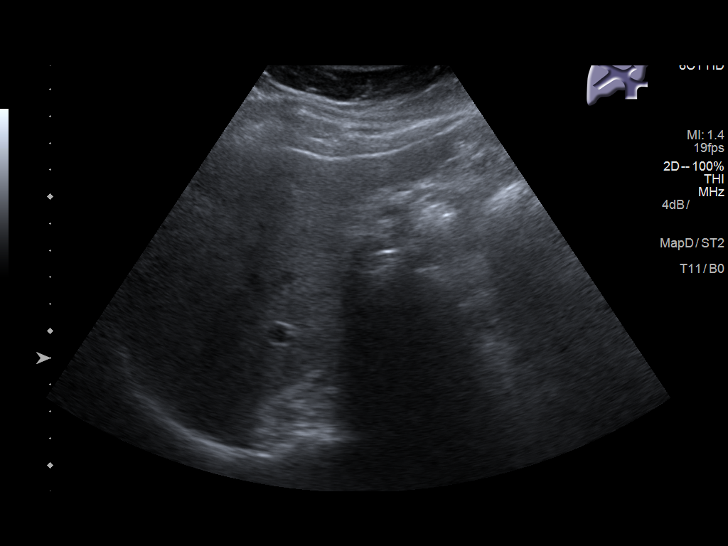
[im 47/57]
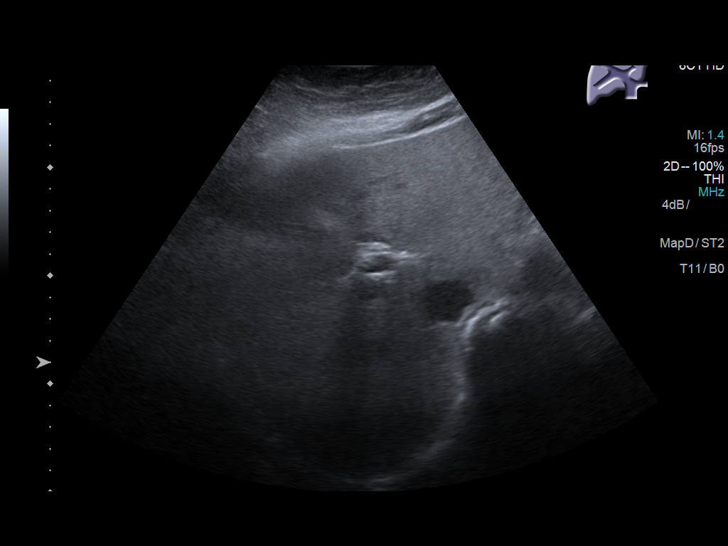
[im 52/57]
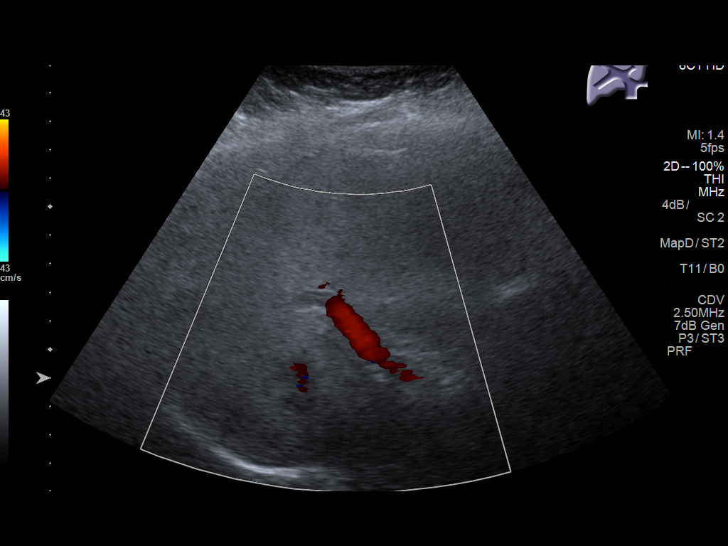
[im 57/57]
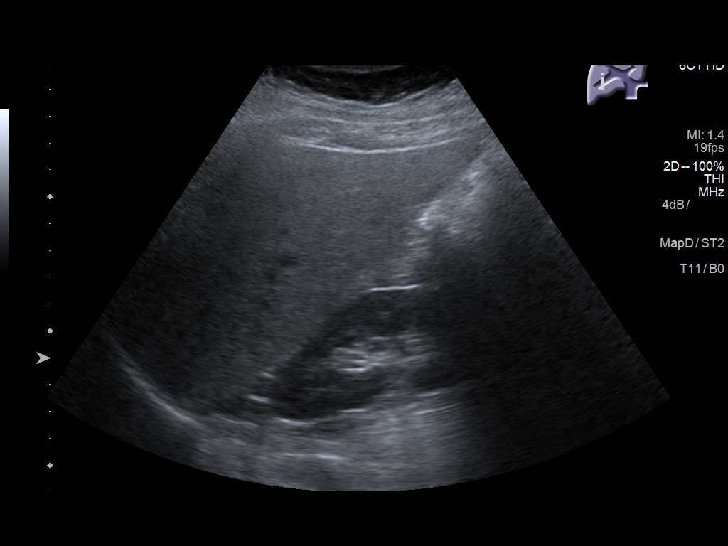

[14 of 25 positions shown; findings below may reference images not displayed]

FINDINGS: Gallbladder:

There is no gallbladder wall thickening. There is no significant
pericholecystic free fluid. There is a wall echo shadow sign
consistent with a gallbladder full of stones.

Common bile duct:

Diameter: 3.5 mm

Liver:

No focal lesion identified. There is diffusely increased hepatic
echogenicity. Portal vein is patent on color Doppler imaging with
normal direction of blood flow towards the liver.
IMPRESSION: 1. Cholelithiasis without sonographic evidence of acute
cholecystitis.
2. Hepatic steatosis.

## 2020-08-31 ENCOUNTER — Other Ambulatory Visit: Payer: Self-pay

## 2020-08-31 ENCOUNTER — Ambulatory Visit (INDEPENDENT_AMBULATORY_CARE_PROVIDER_SITE_OTHER): Payer: 59 | Admitting: Family Medicine

## 2020-08-31 ENCOUNTER — Encounter: Payer: Self-pay | Admitting: Family Medicine

## 2020-08-31 VITALS — BP 120/76 | HR 99 | Temp 98.3°F | Resp 16 | Ht 60.0 in | Wt 154.7 lb

## 2020-08-31 DIAGNOSIS — E1169 Type 2 diabetes mellitus with other specified complication: Secondary | ICD-10-CM | POA: Diagnosis not present

## 2020-08-31 DIAGNOSIS — D649 Anemia, unspecified: Secondary | ICD-10-CM

## 2020-08-31 DIAGNOSIS — R590 Localized enlarged lymph nodes: Secondary | ICD-10-CM

## 2020-08-31 DIAGNOSIS — Z23 Encounter for immunization: Secondary | ICD-10-CM

## 2020-08-31 DIAGNOSIS — Z6830 Body mass index (BMI) 30.0-30.9, adult: Secondary | ICD-10-CM

## 2020-08-31 DIAGNOSIS — E119 Type 2 diabetes mellitus without complications: Secondary | ICD-10-CM

## 2020-08-31 DIAGNOSIS — E669 Obesity, unspecified: Secondary | ICD-10-CM

## 2020-08-31 DIAGNOSIS — I700 Atherosclerosis of aorta: Secondary | ICD-10-CM

## 2020-08-31 DIAGNOSIS — R918 Other nonspecific abnormal finding of lung field: Secondary | ICD-10-CM

## 2020-08-31 DIAGNOSIS — Z0000 Encounter for general adult medical examination without abnormal findings: Secondary | ICD-10-CM

## 2020-08-31 DIAGNOSIS — E785 Hyperlipidemia, unspecified: Secondary | ICD-10-CM

## 2020-08-31 NOTE — Patient Instructions (Signed)
Stool cards if your blood levels are going lower.  Hemoglobin  Date Value Ref Range Status  02/29/2020 11.3 (L) 11.7 - 15.5 g/dL Final  08/17/2019 11.9 11.7 - 15.5 g/dL Final  05/31/2019 11.9 (L) 12.0 - 15.0 g/dL Final  02/21/2019 11.6 (L) 11.7 - 15.5 g/dL Final  12/26/2015 11.9 11.1 - 15.9 g/dL Final  09/19/2015 11.9 11.1 - 15.9 g/dL Final  05/16/2015 11.5 11.1 - 15.9 g/dL Final    Health Maintenance  Topic Date Due   COVID-19 Vaccine (1) Never done   Colon Cancer Screening  Never done   Pap Smear  10/28/2017   Flu Shot  06/17/2020   Hemoglobin A1C  08/30/2020   Mammogram  11/16/2020*   Complete foot exam   02/28/2021   Urine Protein Check  02/28/2021   Tetanus Vaccine  01/19/2029    Hepatitis C: One time screening is recommended by Center for Disease Control  (CDC) for  adults born from [birth year] through 1965.   Completed   HIV Screening  Completed  *Topic was postponed. The date shown is not the original due date.    Preventive Care 49-53 Years Old, Female Preventive care refers to visits with your health care provider and lifestyle choices that can promote health and wellness. This includes:  A yearly physical exam. This may also be called an annual well check.  Regular dental visits and eye exams.  Immunizations.  Screening for certain conditions.  Healthy lifestyle choices, such as eating a healthy diet, getting regular exercise, not using drugs or products that contain nicotine and tobacco, and limiting alcohol use. What can I expect for my preventive care visit? Physical exam Your health care provider will check your:  Height and weight. This may be used to calculate body mass index (BMI), which tells if you are at a healthy weight.  Heart rate and blood pressure.  Skin for abnormal spots. Counseling Your health care provider may ask you questions about your:  Alcohol, tobacco, and drug use.  Emotional well-being.  Home and relationship  well-being.  Sexual activity.  Eating habits.  Work and work Statistician.  Method of birth control.  Menstrual cycle.  Pregnancy history. What immunizations do I need?  Influenza (flu) vaccine  This is recommended every year. Tetanus, diphtheria, and pertussis (Tdap) vaccine  You may need a Td booster every 10 years. Varicella (chickenpox) vaccine  You may need this if you have not been vaccinated. Zoster (shingles) vaccine  You may need this after age 43. Measles, mumps, and rubella (MMR) vaccine  You may need at least one dose of MMR if you were born in 1957 or later. You may also need a second dose. Pneumococcal conjugate (PCV13) vaccine  You may need this if you have certain conditions and were not previously vaccinated. Pneumococcal polysaccharide (PPSV23) vaccine  You may need one or two doses if you smoke cigarettes or if you have certain conditions. Meningococcal conjugate (MenACWY) vaccine  You may need this if you have certain conditions. Hepatitis A vaccine  You may need this if you have certain conditions or if you travel or work in places where you may be exposed to hepatitis A. Hepatitis B vaccine  You may need this if you have certain conditions or if you travel or work in places where you may be exposed to hepatitis B. Haemophilus influenzae type b (Hib) vaccine  You may need this if you have certain conditions. Human papillomavirus (HPV) vaccine  If recommended by  your health care provider, you may need three doses over 6 months. You may receive vaccines as individual doses or as more than one vaccine together in one shot (combination vaccines). Talk with your health care provider about the risks and benefits of combination vaccines. What tests do I need? Blood tests  Lipid and cholesterol levels. These may be checked every 5 years, or more frequently if you are over 22 years old.  Hepatitis C test.  Hepatitis B test. Screening  Lung  cancer screening. You may have this screening every year starting at age 10 if you have a 30-pack-year history of smoking and currently smoke or have quit within the past 15 years.  Colorectal cancer screening. All adults should have this screening starting at age 22 and continuing until age 29. Your health care provider may recommend screening at age 48 if you are at increased risk. You will have tests every 1-10 years, depending on your results and the type of screening test.  Diabetes screening. This is done by checking your blood sugar (glucose) after you have not eaten for a while (fasting). You may have this done every 1-3 years.  Mammogram. This may be done every 1-2 years. Talk with your health care provider about when you should start having regular mammograms. This may depend on whether you have a family history of breast cancer.  BRCA-related cancer screening. This may be done if you have a family history of breast, ovarian, tubal, or peritoneal cancers.  Pelvic exam and Pap test. This may be done every 3 years starting at age 6. Starting at age 53, this may be done every 5 years if you have a Pap test in combination with an HPV test. Other tests  Sexually transmitted disease (STD) testing.  Bone density scan. This is done to screen for osteoporosis. You may have this scan if you are at high risk for osteoporosis. Follow these instructions at home: Eating and drinking  Eat a diet that includes fresh fruits and vegetables, whole grains, lean protein, and low-fat dairy.  Take vitamin and mineral supplements as recommended by your health care provider.  Do not drink alcohol if: ? Your health care provider tells you not to drink. ? You are pregnant, may be pregnant, or are planning to become pregnant.  If you drink alcohol: ? Limit how much you have to 0-1 drink a day. ? Be aware of how much alcohol is in your drink. In the [place] one drink equals one 12 oz bottle of beer (355  mL), one 5 oz glass of wine (148 mL), or one 1 oz glass of hard liquor (44 mL). Lifestyle  Take daily care of your teeth and gums.  Stay active. Exercise for at least 30 minutes on 5 or more days each week.  Do not use any products that contain nicotine or tobacco, such as cigarettes, e-cigarettes, and chewing tobacco. If you need help quitting, ask your health care provider.  If you are sexually active, practice safe sex. Use a condom or other form of birth control (contraception) in order to prevent pregnancy and STIs (sexually transmitted infections).  If told by your health care provider, take low-dose aspirin daily starting at age 8. What's next?  Visit your health care provider once a year for a well check visit.  Ask your health care provider how often you should have your eyes and teeth checked.  Stay up to date on all vaccines. This information is not intended to  replace advice given to you by your health care provider. Make sure you discuss any questions you have with your health care provider. Document Revised: 07/15/2018 Document Reviewed: 07/15/2018 Elsevier Patient Education  2020 [employer].

## 2020-08-31 NOTE — Progress Notes (Signed)
Patient: Kristine Alexander, Female    DOB: 1956-05-21, 64 y.o.   MRN: [id number] Kristine Berry, PA-C Visit Date: 08/31/2020  Today's Provider: Danelle Berry, PA-C   Chief Complaint  Patient presents with  . Annual Exam   Subjective:   Patient presents here with her daughter who helps answer questions for her and also a interpreter  Annual physical exam:  Kristine Alexander is a 64 y.o. female who presents today for complete physical exam:  Exercise/Activity:   Very physical work and walking, at least 30 min daily  Diet/nutrition: Healthy diet Sleep: Sleeps good  Mild anemia - denies melena hematochezia   USPSTF grade A and B recommendations - reviewed and addressed today  Depression:  Phq 9 completed today by patient, was reviewed by me with patient in the room PHQ score is neg, pt feels good PHQ 2/9 Scores 08/31/2020 02/29/2020 11/17/2019 08/17/2019  PHQ - 2 Score 0 0 0 0  PHQ- 9 Score - 0 0 0   Depression screen [facility] 2/9 08/31/2020 02/29/2020 11/17/2019 08/17/2019 02/21/2019  Decreased Interest 0 0 0 0 0  Down, Depressed, Hopeless 0 0 0 0 0  PHQ - 2 Score 0 0 0 0 0  Altered sleeping - 0 0 0 0  Tired, decreased energy - 0 0 0 0  Change in appetite - 0 0 0 0  Feeling bad or failure about yourself  - 0 0 0 0  Trouble concentrating - 0 0 0 0  Moving slowly or fidgety/restless - 0 0 0 0  Suicidal thoughts - 0 0 0 0  PHQ-9 Score - 0 0 0 0  Difficult doing work/chores - Not difficult at all Not difficult at all Not difficult at all Not difficult at all  Some recent data might be hidden    Alcohol screening:   Office Visit from 08/31/2020 in [facility]  AUDIT-C Score 0      Immunizations and Health Maintenance: Health Maintenance  Topic Date Due  . COVID-19 Vaccine (1) Never done  . COLONOSCOPY  Never done  . PAP SMEAR-Modifier  10/28/2017  . INFLUENZA VACCINE  06/17/2020  . HEMOGLOBIN A1C  08/30/2020  . MAMMOGRAM  11/16/2020 (Originally 12/01/1973)   . FOOT EXAM  02/28/2021  . URINE MICROALBUMIN  02/28/2021  . TETANUS/TDAP  01/19/2029  . Hepatitis C Screening  Completed  . HIV Screening  Completed     Hep C Screening: done  STD testing and prevention (HIV/chl/gon/syphilis):  see above, no additional testing desired by pt today  Intimate partner violence:safe at home  Sexual History/Pain during Intercourse: Married  Menstrual History/LMP/Abnormal Bleeding: denies any abnormal bleeding No LMP recorded. Patient is postmenopausal.  Incontinence Symptoms: none  Breast cancer: refuses screening  Cervical cancer screening: refuses screening  Osteoporosis:   Discussion on osteoporosis per age, including high calcium and vitamin D supplementation, weight bearing exercises  Skin cancer:  Hx of skin CA -  NO Discussed atypical lesions   Colorectal cancer:   Colonoscopy is due - pt refuses screening - willing to do stool card only but not colonoscopy  Lung cancer:   Low Dose CT Chest recommended if Age 70-80 years, 30 pack-year currently smoking OR have quit w/in 15years. Patient does not qualify.    Social History   Tobacco Use  . Smoking status: Never Smoker  . Smokeless tobacco: Never Used  Vaping Use  . Vaping Use: Never used  Substance Use Topics  . Alcohol use:  No    Alcohol/week: 0.0 standard drinks  . Drug use: No       Office Visit from 08/31/2020 in [facility]  AUDIT-C Score 0      Family History  Problem Relation Age of Onset  . Hyperlipidemia Mother   . Hyperlipidemia Father   . Hyperlipidemia Sister   . Diabetes Brother   . Hyperlipidemia Brother      Blood pressure/Hypertension: BP Readings from Last 3 Encounters:  08/31/20 120/76  02/29/20 118/78  11/17/19 126/68    Weight/Obesity: Wt Readings from Last 3 Encounters:  08/31/20 154 lb 11.2 oz (70.2 kg)  02/29/20 162 lb 3.2 oz (73.6 kg)  11/17/19 169 lb 4.8 oz (76.8 kg)   BMI Readings from Last 3 Encounters:    08/31/20 30.21 kg/m  02/29/20 31.68 kg/m  11/17/19 33.06 kg/m     Lipids:  Lab Results  Component Value Date   CHOL 128 02/29/2020   CHOL 144 08/17/2019   CHOL 150 08/20/2018   Lab Results  Component Value Date   HDL 45 (L) 02/29/2020   HDL 43 (L) 08/17/2019   HDL 46 (L) 08/20/2018   Lab Results  Component Value Date   LDLCALC 70 02/29/2020   LDLCALC 84 08/17/2019   LDLCALC 86 08/20/2018   Lab Results  Component Value Date   TRIG 57 02/29/2020   TRIG 82 08/17/2019   TRIG 87 08/20/2018   Lab Results  Component Value Date   CHOLHDL 2.8 02/29/2020   CHOLHDL 3.3 08/17/2019   CHOLHDL 3.3 08/20/2018   No results found for: LDLDIRECT Based on the results of lipid panel his/her cardiovascular risk factor ( using [place] Cohort )  in the next 10 years is: The ASCVD Risk score Denman Kristine DC Jr., et al., 2013) failed to calculate for the following reasons:   The valid total cholesterol range is 130 to 320 mg/dL Glucose:  Glucose, Bld  Date Value Ref Range Status  02/29/2020 86 65 - 99 mg/dL Final    Comment:    .            Fasting reference interval .   08/17/2019 122 (H) 65 - 99 mg/dL Final    Comment:    .            Fasting reference interval . For someone without known diabetes, a glucose value between 100 and 125 mg/dL is consistent with prediabetes and should be confirmed with a follow-up test. .   05/31/2019 121 (H) 70 - 99 mg/dL Final   Glucose-Capillary  Date Value Ref Range Status  05/19/2019 132 (H) 70 - 99 mg/dL Final  58/07/9832 98 70 - 99 mg/dL Final  82/50/5397 91 70 - 99 mg/dL Final   Hypertension: BP Readings from Last 3 Encounters:  08/31/20 120/76  02/29/20 118/78  11/17/19 126/68   Obesity: Wt Readings from Last 3 Encounters:  08/31/20 154 lb 11.2 oz (70.2 kg)  02/29/20 162 lb 3.2 oz (73.6 kg)  11/17/19 169 lb 4.8 oz (76.8 kg)   BMI Readings from Last 3 Encounters:  08/31/20 30.21 kg/m  02/29/20 31.68 kg/m  11/17/19 33.06  kg/m     Social History      She        Social History   Socioeconomic History  . Marital status: Married    Spouse name: Not on file  . Number of children: Not on file  . Years of education: Not on file  .  Highest education level: Not on file  Occupational History  . Not on file  Tobacco Use  . Smoking status: Never Smoker  . Smokeless tobacco: Never Used  Vaping Use  . Vaping Use: Never used  Substance and Sexual Activity  . Alcohol use: No    Alcohol/week: 0.0 standard drinks  . Drug use: No  . Sexual activity: Not Currently  Other Topics Concern  . Not on file  Social History Narrative  . Not on file   Social Determinants of Health   Financial Resource Strain: Low Risk   . Difficulty of Paying Living Expenses: Not hard at all  Food Insecurity: No Food Insecurity  . Worried About Programme researcher, broadcasting/film/video in the Last Year: Never true  . Ran Out of Food in the Last Year: Never true  Transportation Needs: No Transportation Needs  . Lack of Transportation (Medical): No  . Lack of Transportation (Non-Medical): No  Physical Activity: Insufficiently Active  . Days of Exercise per Week: 5 days  . Minutes of Exercise per Session: 10 min  Stress: No Stress Concern Present  . Feeling of Stress : Only a little  Social Connections: Moderately Integrated  . Frequency of Communication with Friends and Family: Once a week  . Frequency of Social Gatherings with Friends and Family: Once a week  . Attends Religious Services: 1 to 4 times per year  . Active Member of Clubs or Organizations: Yes  . Attends Banker Meetings: Never  . Marital Status: Married    Family History        Family History  Problem Relation Age of Onset  . Hyperlipidemia Mother   . Hyperlipidemia Father   . Hyperlipidemia Sister   . Diabetes Brother   . Hyperlipidemia Brother     Patient Active Problem List   Diagnosis Date Noted  . Mass of upper lobe of lung 02/29/2020  . Thoracic  aorta atherosclerosis (HCC) 08/17/2019  . Hyperlipidemia associated with type 2 diabetes mellitus (HCC) 08/17/2019  . Mediastinal lymphadenopathy 08/17/2019  . Lung nodule, multiple 08/17/2019  . Aortic systolic murmur on examination 08/17/2019  . S/P cholecystectomy 08/17/2019  . Hepatic steatosis 03/22/2019  . Prediabetes 08/20/2017  . Anemia 08/20/2017  . Vitamin B12 deficiency 08/20/2017  . Cardiac murmur 09/18/2015  . Class 1 obesity with serious comorbidity and body mass index (BMI) of 33.0 to 33.9 in adult 05/16/2015  . Osteoarthrosis, generalized, multiple joints 05/16/2015  . Hyperlipidemia 05/16/2015    Past Surgical History:  Procedure Laterality Date  . CATARACT EXTRACTION W/PHACO Right 02/25/2016   Procedure: CATARACT EXTRACTION PHACO AND INTRAOCULAR LENS PLACEMENT (IOC);  Surgeon: Sallee Lange, MD;  Location: [facility] ORS;  Service: Ophthalmology;  Laterality: Right;  [place]    00:48.2 AP%   23.5 CDE   22.58 fluid pack lot # [id number]  . CATARACT EXTRACTION W/PHACO Left 06/08/2018   Procedure: CATARACT EXTRACTION PHACO AND INTRAOCULAR LENS PLACEMENT (IOC);  Surgeon: Galen Manila, MD;  Location: [facility] ORS;  Service: Ophthalmology;  Laterality: Left;  [place] 00:28.4 AP% 13.0 CDE 3.68 Fluid pack Lot # [id number]  . CHOLECYSTECTOMY N/A 05/19/2019   Procedure: LAPAROSCOPIC CHOLECYSTECTOMY;  Surgeon: Duanne Guess, MD;  Location: [facility] ORS;  Service: General;  Laterality: N/A;  . TUBAL LIGATION       Current Outpatient Medications:  .  metFORMIN (GLUCOPHAGE-XR) 500 MG 24 hr tablet, TAKE 2 TABLETS(1000 MG) BY MOUTH DAILY, Disp: 180 tablet, Rfl: 3 .  atorvastatin (LIPITOR) 10 MG tablet,  TAKE 1 TABLET BY MOUTH DAILY, Disp: 90 tablet, Rfl: 3  No Known Allergies  Patient Care Team: Kristine Berry, PA-C as PCP - General (Family Medicine)  Review of Systems  Constitutional: Negative.   HENT: Negative.   Eyes: Negative.   Respiratory: Negative.   Cardiovascular: Negative.     Gastrointestinal: Negative.   Endocrine: Negative.   Genitourinary: Negative.   Musculoskeletal: Negative.   Skin: Negative.   Allergic/Immunologic: Negative.   Neurological: Negative.   Hematological: Negative.   Psychiatric/Behavioral: Negative.   All other systems reviewed and are negative.    I personally reviewed active problem list, medication list, allergies, family history, social history, health maintenance, notes from last encounter, lab results, imaging with the patient/caregiver today.        Objective:   Vitals:  Vitals:   08/31/20 1531  BP: 120/76  Pulse: 99  Resp: 16  Temp: 98.3 F (36.8 C)  TempSrc: Oral  SpO2: 99%  Weight: 154 lb 11.2 oz (70.2 kg)  Height: 5' (1.524 m)    Body mass index is 30.21 kg/m.  Physical Exam Vitals and nursing note reviewed.  Constitutional:      General: She is not in acute distress.    Appearance: Normal appearance. She is well-developed. She is obese. She is not ill-appearing, toxic-appearing or diaphoretic.     Interventions: Face mask in place.  HENT:     Head: Normocephalic and atraumatic.     Right Ear: External ear normal.     Left Ear: External ear normal.     Mouth/Throat:     Mouth: Mucous membranes are moist.     Pharynx: Oropharynx is clear.  Eyes:     General: Lids are normal. No scleral icterus.       Right eye: No discharge.        Left eye: No discharge.     Conjunctiva/sclera: Conjunctivae normal.     Pupils: Pupils are equal, round, and reactive to light.  Neck:     Trachea: Phonation normal. No tracheal deviation.  Cardiovascular:     Rate and Rhythm: Normal rate and regular rhythm.     Pulses: Normal pulses.          Radial pulses are 2+ on the right side and 2+ on the left side.       Posterior tibial pulses are 2+ on the right side and 2+ on the left side.     Heart sounds: Normal heart sounds. No murmur heard.  No friction rub. No gallop.   Pulmonary:     Effort: Pulmonary effort is  normal. No respiratory distress.     Breath sounds: Normal breath sounds. No stridor. No wheezing, rhonchi or rales.  Chest:     Chest wall: No tenderness.  Abdominal:     General: Bowel sounds are normal. There is no distension.     Palpations: Abdomen is soft.     Tenderness: There is no abdominal tenderness. There is no right CVA tenderness, left CVA tenderness or guarding.  Musculoskeletal:        General: No tenderness.     Cervical back: Normal range of motion and neck supple.     Right lower leg: No edema.     Left lower leg: No edema.  Skin:    General: Skin is warm and dry.     Capillary Refill: Capillary refill takes less than 2 seconds.     Coloration: Skin is not jaundiced or pale.  Findings: No bruising or rash.  Neurological:     Mental Status: She is alert. Mental status is at baseline.     Motor: No abnormal muscle tone.     Gait: Gait normal.  Psychiatric:        Mood and Affect: Mood normal.        Speech: Speech normal.        Behavior: Behavior normal.       Fall Risk: Fall Risk  08/31/2020 02/29/2020 11/17/2019 08/17/2019 03/03/2019  Falls in the past year? 0 0 0 0 0  Number falls in past yr: 0 0 0 0 -  Injury with Fall? 0 0 0 0 -  Follow up Falls evaluation completed - - - -    Functional Status Survey: Is the patient deaf or have difficulty hearing?: No Does the patient have difficulty seeing, even when wearing glasses/contacts?: No Does the patient have difficulty concentrating, remembering, or making decisions?: No Does the patient have difficulty walking or climbing stairs?: No Does the patient have difficulty dressing or bathing?: No Does the patient have difficulty doing errands alone such as visiting a doctor's office or shopping?: No   Assessment & Plan:    CPE completed today  . USPSTF grade A and B recommendations reviewed with patient; age-appropriate recommendations, preventive care, screening tests, etc discussed and encouraged;  healthy living encouraged; see AVS for patient education given to patient  . Discussed importance of 150 minutes of physical activity weekly, AHA exercise recommendations given to pt in AVS/handout  . Discussed importance of healthy diet:  eating lean meats and proteins, avoiding trans fats and saturated fats, avoid simple sugars and excessive carbs in diet, eat 6 servings of fruit/vegetables daily and drink plenty of water and avoid sweet beverages.    . Recommended pt to do annual eye exam and routine dental exams/cleanings  . Depression, alcohol, fall screening completed as documented above and per flowsheets  . Reviewed Health Maintenance: Health Maintenance  Topic Date Due  . COVID-19 Vaccine (1) Never done  . COLONOSCOPY  Never done  . PAP SMEAR-Modifier  10/28/2017  . INFLUENZA VACCINE  06/17/2020  . HEMOGLOBIN A1C  08/30/2020  . MAMMOGRAM  11/16/2020 (Originally 12/01/1973)  . FOOT EXAM  02/28/2021  . URINE MICROALBUMIN  02/28/2021  . TETANUS/TDAP  01/19/2029  . Hepatitis C Screening  Completed  . HIV Screening  Completed    . Immunizations: Immunization History  Administered Date(s) Administered  . Influenza,inj,Quad PF,6+ Mos 01/30/2017, 08/18/2017, 08/20/2018, 11/17/2019  . Tdap 01/20/2019      ICD-10-CM   1. Adult general medical exam  Z00.00 CBC with Differential/Platelet    COMPLETE METABOLIC PANEL WITH GFR    Lipid panel    Hemoglobin A1c  2. Need for influenza vaccination  Z23 Flu Vaccine QUAD 6+ mos PF IM (Fluarix Quad PF)  3. Hyperlipidemia associated with type 2 diabetes mellitus (HCC)  E11.69 COMPLETE METABOLIC PANEL WITH GFR   E78.5 Lipid panel   Compliant with statin, continues to deny any claudication symptoms, chest pain  4. Type 2 diabetes mellitus without complication, without long-term current use of insulin (HCC)  E11.9 COMPLETE METABOLIC PANEL WITH GFR    Lipid panel    Hemoglobin A1c   Has been well controlled with metformin, she is losing  weight encouraged continued Metformin diet and exercise, recheck A1c  5. Mediastinal lymphadenopathy  R59.0    Patient has follow-up CT scan  6. Thoracic aorta atherosclerosis (HCC)  I70.0    Compliant with statin, monitoring  7. Mass of upper lobe of lung  R91.8    Patient has her follow-up CT chest scan ordered reviewed with her, no new or concerning symptoms  8. Anemia, unspecified type  D64.9    Patient denies blood loss, slight decrease in H/H with last lab work will repeat, if any worsening will need to rule out GI blood loss  9. Class 1 obesity with serious comorbidity and body mass index (BMI) of 30.0 to 30.9 in adult, unspecified obesity type  E66.9    Z68.30    Patient has lost weight since last office visit       Kristine Alexander, Cordelia Poche 08/31/20 3:48 PM  [facility]

## 2020-09-01 LAB — COMPLETE METABOLIC PANEL WITH GFR
AG Ratio: 1.2 (calc) (ref 1.0–2.5)
ALT: 12 U/L (ref 6–29)
AST: 17 U/L (ref 10–35)
Albumin: 4 g/dL (ref 3.6–5.1)
Alkaline phosphatase (APISO): 55 U/L (ref 37–153)
BUN: 15 mg/dL (ref 7–25)
CO2: 28 mmol/L (ref 20–32)
Calcium: 9.7 mg/dL (ref 8.6–10.4)
Chloride: 105 mmol/L (ref 98–110)
Creat: 0.59 mg/dL (ref 0.50–0.99)
GFR, Est African American: 112 mL/min/{1.73_m2} (ref >=60)
GFR, Est Non African American: 97 mL/min/{1.73_m2} (ref >=60)
Globulin: 3.4 g/dL (calc) (ref 1.9–3.7)
Glucose, Bld: 99 mg/dL (ref 65–99)
Potassium: 4 mmol/L (ref 3.5–5.3)
Sodium: 139 mmol/L (ref 135–146)
Total Bilirubin: 0.3 mg/dL (ref 0.2–1.2)
Total Protein: 7.4 g/dL (ref 6.1–8.1)

## 2020-09-01 LAB — CBC WITH DIFFERENTIAL/PLATELET
Absolute Monocytes: 637 cells/uL (ref 200–950)
Basophils Absolute: 27 cells/uL (ref 0–200)
Basophils Relative: 0.3 %
Eosinophils Absolute: 127 cells/uL (ref 15–500)
Eosinophils Relative: 1.4 %
HCT: 35.8 % (ref 35.0–45.0)
Hemoglobin: 11.6 g/dL — ABNORMAL LOW (ref 11.7–15.5)
Lymphs Abs: 2930 cells/uL (ref 850–3900)
MCH: 25.5 pg — ABNORMAL LOW (ref 27.0–33.0)
MCHC: 32.4 g/dL (ref 32.0–36.0)
MCV: 78.7 fL — ABNORMAL LOW (ref 80.0–100.0)
MPV: 10 fL (ref 7.5–12.5)
Monocytes Relative: 7 %
Neutro Abs: 5378 cells/uL (ref 1500–7800)
Neutrophils Relative %: 59.1 %
Platelets: 274 10*3/uL (ref 140–400)
RBC: 4.55 10*6/uL (ref 3.80–5.10)
RDW: 14.4 % (ref 11.0–15.0)
Total Lymphocyte: 32.2 %
WBC: 9.1 10*3/uL (ref 3.8–10.8)

## 2020-09-01 LAB — LIPID PANEL
Cholesterol: 157 mg/dL (ref <200)
HDL: 49 mg/dL — ABNORMAL LOW (ref >=50)
LDL Cholesterol (Calc): 93 mg/dL (calc)
Non-HDL Cholesterol (Calc): 108 mg/dL (calc) (ref <130)
Total CHOL/HDL Ratio: 3.2 (calc) (ref <5.0)
Triglycerides: 63 mg/dL (ref <150)

## 2020-09-01 LAB — HEMOGLOBIN A1C
Hgb A1c MFr Bld: 5.8 % of total Hgb — ABNORMAL HIGH (ref <5.7)
Mean Plasma Glucose: 120 (calc)
eAG (mmol/L): 6.6 (calc)

## 2020-09-04 ENCOUNTER — Telehealth: Payer: Self-pay | Admitting: Family Medicine

## 2020-09-04 NOTE — Telephone Encounter (Signed)
Patients daughter called for her mothers lab results. Contacted office for permission to read results to daughter. I read lab note by Danelle Berry PA written 09/03/20.  She verbalized understanding of all information.

## 2020-09-05 ENCOUNTER — Encounter: Payer: Self-pay | Admitting: Family Medicine

## 2020-11-19 ENCOUNTER — Other Ambulatory Visit: Payer: Self-pay

## 2020-11-19 ENCOUNTER — Ambulatory Visit (INDEPENDENT_AMBULATORY_CARE_PROVIDER_SITE_OTHER): Payer: 59 | Admitting: Internal Medicine

## 2020-11-19 ENCOUNTER — Encounter: Payer: Self-pay | Admitting: Internal Medicine

## 2020-11-19 VITALS — BP 132/70 | HR 92 | Temp 98.0°F | Resp 16 | Wt 152.1 lb

## 2020-11-19 DIAGNOSIS — E785 Hyperlipidemia, unspecified: Secondary | ICD-10-CM

## 2020-11-19 DIAGNOSIS — M79604 Pain in right leg: Secondary | ICD-10-CM | POA: Diagnosis not present

## 2020-11-19 DIAGNOSIS — E119 Type 2 diabetes mellitus without complications: Secondary | ICD-10-CM

## 2020-11-19 DIAGNOSIS — H6692 Otitis media, unspecified, left ear: Secondary | ICD-10-CM

## 2020-11-19 DIAGNOSIS — H9203 Otalgia, bilateral: Secondary | ICD-10-CM | POA: Diagnosis not present

## 2020-11-19 DIAGNOSIS — E1169 Type 2 diabetes mellitus with other specified complication: Secondary | ICD-10-CM

## 2020-11-19 MED ORDER — AMOXICILLIN-POT CLAVULANATE 875-125 MG PO TABS: 1.0000 | ORAL_TABLET | Freq: Two times a day (BID) | ORAL | 0 refills | Status: DC

## 2020-11-19 NOTE — Patient Instructions (Signed)
Please pick up the antibiotic prescribed and take twice daily as directed.

## 2020-11-19 NOTE — Progress Notes (Signed)
Patient ID: Kristine Alexander, female    DOB: 04/09/1956, 65 y.o.   MRN: [id number]  PCP: Danelle Berry, PA-C  No chief complaint on file.   Subjective:   Kristine Alexander is a 65 y.o. female, presents to clinic with CC of the following:  No chief complaint on file.   HPI:  Patient is a 65 year old female patient of Danelle Berry Last visit with her was in October 2021. Follows up today with ear pain. Also noted some pain in her right leg at times. Is here with her daughter who helps answer questions for her and as an interpreter.  The ear pain started about 2 weeks ago, and is both sides, although much more problematic on the left.  She denies any fevers, no cough, no sore throats or increased congestion, no increased postnasal drip.  Denies any diminished hearing.  She has had a mild headache with this.  Denies any neck swelling or lumps, no rigidity. Noted at times a tingling in the ear, when asked if had any ringing in the ears. She has not had the Covid vaccine.  She also noted over the past couple weeks some discomfort felt down the front of the right leg, not specific to the knee joint or the ankle joint or the hip joint.  Is intermittent.  Has had no swelling of the joints.  She noted she was on Lipitor, although stopped taking that a couple weeks ago as she was having some pains and aches and felt could be related to this medicine. Her PCP is Danelle Berry, as she has not been involved after this medicine was stopped by the patient.  She denies any allergies to medicines.  She is on Metformin for diabetes.  Patient Active Problem List   Diagnosis Date Noted  . Mass of upper lobe of lung 02/29/2020  . Thoracic aorta atherosclerosis (HCC) 08/17/2019  . Hyperlipidemia associated with type 2 diabetes mellitus (HCC) 08/17/2019  . Mediastinal lymphadenopathy 08/17/2019  . Lung nodule, multiple 08/17/2019  . Aortic systolic murmur on examination 08/17/2019  . S/P cholecystectomy  08/17/2019  . Hepatic steatosis 03/22/2019  . Prediabetes 08/20/2017  . Anemia 08/20/2017  . Vitamin B12 deficiency 08/20/2017  . Cardiac murmur 09/18/2015  . Class 1 obesity with serious comorbidity and body mass index (BMI) of 33.0 to 33.9 in adult 05/16/2015  . Osteoarthrosis, generalized, multiple joints 05/16/2015  . Hyperlipidemia 05/16/2015      Current Outpatient Medications:  .  atorvastatin (LIPITOR) 10 MG tablet, TAKE 1 TABLET BY MOUTH DAILY, Disp: 90 tablet, Rfl: 3 .  metFORMIN (GLUCOPHAGE-XR) 500 MG 24 hr tablet, TAKE 2 TABLETS(1000 MG) BY MOUTH DAILY, Disp: 180 tablet, Rfl: 3   No Known Allergies   Past Surgical History:  Procedure Laterality Date  . CATARACT EXTRACTION W/PHACO Right 02/25/2016   Procedure: CATARACT EXTRACTION PHACO AND INTRAOCULAR LENS PLACEMENT (IOC);  Surgeon: Sallee Lange, MD;  Location: [facility] ORS;  Service: Ophthalmology;  Laterality: Right;  [place]    00:48.2 AP%   23.5 CDE   22.58 fluid pack lot # [id number]  . CATARACT EXTRACTION W/PHACO Left 06/08/2018   Procedure: CATARACT EXTRACTION PHACO AND INTRAOCULAR LENS PLACEMENT (IOC);  Surgeon: Galen Manila, MD;  Location: [facility] ORS;  Service: Ophthalmology;  Laterality: Left;  [place] 00:28.4 AP% 13.0 CDE 3.68 Fluid pack Lot # [id number]  . CHOLECYSTECTOMY N/A 05/19/2019   Procedure: LAPAROSCOPIC CHOLECYSTECTOMY;  Surgeon: Duanne Guess, MD;  Location: [facility] ORS;  Service: General;  Laterality: N/A;  .  TUBAL LIGATION       Family History  Problem Relation Age of Onset  . Hyperlipidemia Mother   . Hyperlipidemia Father   . Hyperlipidemia Sister   . Diabetes Brother   . Hyperlipidemia Brother      Social History   Tobacco Use  . Smoking status: Never Smoker  . Smokeless tobacco: Never Used  Substance Use Topics  . Alcohol use: No    Alcohol/week: 0.0 standard drinks    With staff assistance, above reviewed with the patient/caregiver today.  ROS: As per HPI, otherwise no specific  complaints on a limited and focused system review   No results found for this or any previous visit (from the past 72 hour(s)).   PHQ2/9: Depression screen [facility] 2/9 11/19/2020 08/31/2020 02/29/2020 11/17/2019 08/17/2019  Decreased Interest 0 0 0 0 0  Down, Depressed, Hopeless 0 0 0 0 0  PHQ - 2 Score 0 0 0 0 0  Altered sleeping - - 0 0 0  Tired, decreased energy - - 0 0 0  Change in appetite - - 0 0 0  Feeling bad or failure about yourself  - - 0 0 0  Trouble concentrating - - 0 0 0  Moving slowly or fidgety/restless - - 0 0 0  Suicidal thoughts - - 0 0 0  PHQ-9 Score - - 0 0 0  Difficult doing work/chores - - Not difficult at all Not difficult at all Not difficult at all  Some recent data might be hidden   PHQ-2/9 Result is neg  Fall Risk: Fall Risk  11/19/2020 08/31/2020 02/29/2020 11/17/2019 08/17/2019  Falls in the past year? 0 0 0 0 0  Number falls in past yr: 0 0 0 0 0  Injury with Fall? 0 0 0 0 0  Follow up - Falls evaluation completed - - -      Objective:   Vitals:   11/19/20 1017  BP: 132/70  Pulse: 92  Resp: 16  Temp: 98 F (36.7 C)  TempSrc: Oral  SpO2: 98%  Weight: 152 lb 1.6 oz (69 kg)    Body mass index is 29.7 kg/m.  Physical Exam   NAD, masked, not ill-appearing.  Daughter interpreted for her today. HEENT - [place]/AT, sclera anicteric, PERRL, conj - non-inj'ed, nontender tugging on the lobes bilaterally, with some mild cerumen in the canals noted bilaterally as well.  Her right canal was clear with the drum difficult to visualize due to the cerumen.  The left canal was mildly erythematous more towards the TM, with some mild swelling evident in this area.  The TM that could be visualized was without marked bulging, pharynx clear.  No focal sinus tenderness, nares patent Neck - supple, no marked adenopathy, no rigidity  Car - RRR without m/g/r Pulm- RR and effort normal at rest, CTA without wheeze or rales Ext - no LE edema, the hip, knee, and ankle joint had  good range of motion, no tenderness palpating these joints and no knee or ankle swelling evident.  No bruising or erythema noted in the lower extremity.  She had adequate strength. Neuro/psychiatric - affect was not flat, appropriate with conversation  Alert with her speech normal.  Results for orders placed or performed in visit on 08/31/20  CBC with Differential/Platelet  Result Value Ref Range   WBC 9.1 3.8 - 10.8 Thousand/uL   RBC 4.55 3.80 - 5.10 Million/uL   Hemoglobin 11.6 (L) 11.7 - 15.5 g/dL   HCT 35.8  35.0 - 45.0 %   MCV 78.7 (L) 80.0 - 100.0 fL   MCH 25.5 (L) 27.0 - 33.0 pg   MCHC 32.4 32.0 - 36.0 g/dL   RDW 09.9 83.3 - 82.5 %   Platelets 274 140 - 400 Thousand/uL   MPV 10.0 7.5 - 12.5 fL   Neutro Abs 5,378 1,500 - 7,800 cells/uL   Lymphs Abs 2,930 850 - 3,900 cells/uL   Absolute Monocytes 637 200 - 950 cells/uL   Eosinophils Absolute 127 15 - 500 cells/uL   Basophils Absolute 27 0 - 200 cells/uL   Neutrophils Relative % 59.1 %   Total Lymphocyte 32.2 %   Monocytes Relative 7.0 %   Eosinophils Relative 1.4 %   Basophils Relative 0.3 %  COMPLETE METABOLIC PANEL WITH GFR  Result Value Ref Range   Glucose, Bld 99 65 - 99 mg/dL   BUN 15 7 - 25 mg/dL   Creat 0.53 9.76 - 7.34 mg/dL   GFR, Est Non African American 97 > OR = 60 mL/min/1.50m2   GFR, Est African American 112 > OR = 60 mL/min/1.31m2   BUN/Creatinine Ratio NOT APPLICABLE 6 - 22 (calc)   Sodium 139 135 - 146 mmol/L   Potassium 4.0 3.5 - 5.3 mmol/L   Chloride 105 98 - 110 mmol/L   CO2 28 20 - 32 mmol/L   Calcium 9.7 8.6 - 10.4 mg/dL   Total Protein 7.4 6.1 - 8.1 g/dL   Albumin 4.0 3.6 - 5.1 g/dL   Globulin 3.4 1.9 - 3.7 g/dL (calc)   AG Ratio 1.2 1.0 - 2.5 (calc)   Total Bilirubin 0.3 0.2 - 1.2 mg/dL   Alkaline phosphatase (APISO) 55 37 - 153 U/L   AST 17 10 - 35 U/L   ALT 12 6 - 29 U/L  Lipid panel  Result Value Ref Range   Cholesterol 157 <200 mg/dL   HDL 49 (L) > OR = 50 mg/dL   Triglycerides 63  <193 mg/dL   LDL Cholesterol (Calc) 93 mg/dL (calc)   Total CHOL/HDL Ratio 3.2 <5.0 (calc)   Non-HDL Cholesterol (Calc) 108 <130 mg/dL (calc)  Hemoglobin X9K  Result Value Ref Range   Hgb A1c MFr Bld 5.8 (H) <5.7 % of total Hgb   Mean Plasma Glucose 120 (calc)   eAG (mmol/L) 6.6 (calc)       Assessment & Plan:   1. Otalgia of both ears 2. Left otitis media, unspecified otitis media type Do have concerns for a possible infectious source involving the left ear, and do feel adding an antibiotic is appropriate.  Added Augmentin-1 tab twice daily If symptoms not improving or more problematic over time, emphasized the importance of following up Did have some mild cerumen in both canals, although was hesitant to try some forceful irrigation, especially in that left ear which was uncomfortable for her today.  May need in the future pending her status.  3. Leg pain, anterior, right The exact source of the intermittent right leg pain is unclear, with no focal joint concerns. She did just stop the Lipitor product due to some increasing aches and pains, and question if it could be related to that. Felt reasonable to continue off of that until she has a follow-up with her PCP in the near future Recommended continuing to monitor presently.  4. Hyperlipidemia associated with type 2 diabetes mellitus (HCC) 5. Type 2 diabetes mellitus without complication, without long-term current use of insulin (HCC) As above, she stopped the  Lipitor. Did feel she needs a follow-up with her PCP in the next month or 2 at the latest to help continue to monitor. Should follow-up sooner as needed.        Towanda Malkin, MD 11/19/20 10:45 AM

## 2020-11-20 ENCOUNTER — Ambulatory Visit: Payer: 59 | Admitting: Internal Medicine

## 2020-11-22 ENCOUNTER — Telehealth: Payer: Self-pay

## 2020-11-22 NOTE — Telephone Encounter (Signed)
Copied from CRM [id number]. Topic: General - Other >> Nov 22, 2020  1:34 PM Jaquita Rector A wrote: Reason for CRM: Patient daughter in [place] called in to inform Dr Dorris Fetch that the antibiotics amoxicillin-clavulanate (AUGMENTIN) 875-125 MG tablet prescribed to patient at her last visit has no effect on her states that patient had this medication before and it did not help. [place] for a different antibiotic to be prescribed please. Any questions please call   Ph# [phone]

## 2020-11-22 NOTE — Telephone Encounter (Signed)
The patient was seen and the medicine started 11/19/20, needs to give the medicine more time to assess response.

## 2020-11-22 NOTE — Telephone Encounter (Signed)
Called patient. Spoke with daughter, she verbalized understanding.

## 2020-11-27 ENCOUNTER — Telehealth: Payer: Self-pay

## 2020-11-27 NOTE — Telephone Encounter (Signed)
lvm on both numbers for pt to call and schedule an appt Copied from CRM [id number]. Topic: General - Inquiry >> Nov 27, 2020 10:24 AM Adrian Prince D wrote: Reason for CRM: Patients daughter in law called and said that the patient is still experencing ear pain, and she may need another antibiotic. She was seen on 11-22-20 and no relief. She would like a call back. She can be reached at [phone]. Please advise

## 2020-11-28 NOTE — Telephone Encounter (Signed)
Please advise 

## 2020-11-28 NOTE — Telephone Encounter (Signed)
Please have her follow-up with Lavada Mesi, who offered to see her Does need a follow-up visit to reassess rather than just trying a different antibiotic. thanks

## 2020-11-29 NOTE — Telephone Encounter (Signed)
lvm on both numbers for pt to call and schedule an appt

## 2020-11-30 ENCOUNTER — Other Ambulatory Visit: Payer: Self-pay

## 2020-11-30 ENCOUNTER — Ambulatory Visit (INDEPENDENT_AMBULATORY_CARE_PROVIDER_SITE_OTHER): Payer: 59 | Admitting: Family Medicine

## 2020-11-30 ENCOUNTER — Encounter: Payer: Self-pay | Admitting: Family Medicine

## 2020-11-30 VITALS — BP 124/72 | HR 94 | Temp 98.5°F | Resp 16 | Ht 60.0 in | Wt 153.5 lb

## 2020-11-30 DIAGNOSIS — E785 Hyperlipidemia, unspecified: Secondary | ICD-10-CM

## 2020-11-30 DIAGNOSIS — E1169 Type 2 diabetes mellitus with other specified complication: Secondary | ICD-10-CM | POA: Diagnosis not present

## 2020-11-30 DIAGNOSIS — H9202 Otalgia, left ear: Secondary | ICD-10-CM

## 2020-11-30 DIAGNOSIS — M79606 Pain in leg, unspecified: Secondary | ICD-10-CM | POA: Diagnosis not present

## 2020-11-30 MED ORDER — NEOMYCIN-POLYMYXIN-HC 3.5-10000-1 OT SOLN: 4.0000 [drp] | Freq: Four times a day (QID) | OTIC | 0 refills | Status: AC

## 2020-11-30 MED ORDER — ATORVASTATIN CALCIUM 10 MG PO TABS: ORAL_TABLET | ORAL | 3 refills | Status: DC

## 2020-11-30 NOTE — Patient Instructions (Signed)
Use the ear drops on the left ear for the next 5 to 7 days  You can use debrox ear drops on the right ear and gently irrigate at home with warm clean water and hydrogen peroxide (or come in for procedure).  You do not have blocking ear wax or reason to do procedure here today.  Treat your ears and nose with over the counter antihistamine like zyrtec claritin or allegra once a day for the next 3-4 weeks and try a intranasal steroid spray like flonase or nasonex - 2 sprays each nostril once a day    Otitis Externa  Otitis externa is an infection of the outer ear canal. The outer ear canal is the area between the outside of the ear and the eardrum. Otitis externa is sometimes called swimmer's ear. What are the causes? Common causes of this condition include:  Swimming in dirty water.  Moisture in the ear.  An injury to the inside of the ear.  An object stuck in the ear.  A cut or scrape on the outside of the ear. What increases the risk? You are more likely to get this condition if you go swimming often. What are the signs or symptoms?  Itching in the ear. This is often the first symptom.  Swelling of the ear.  Redness in the ear.  Ear pain. The pain may get worse when you pull on your ear.  Pus coming from the ear. How is this treated? This condition may be treated with:  Antibiotic ear drops. These are often given for 10-14 days.  Medicines to reduce itching and swelling. Follow these instructions at home:  If you were given antibiotic ear drops, use them as told by your doctor. Do not stop using them even if your condition gets better.  Take over-the-counter and prescription medicines only as told by your doctor.  Avoid getting water in your ears as told by your doctor. You may be told to avoid swimming or water sports for a few days.  Keep all follow-up visits as told by your doctor. This is important. How is this prevented?  Keep your ears dry. Use the corner of  a towel to dry your ears after you swim or bathe.  Try not to scratch or put things in your ear. Doing these things makes it easier for germs to grow in your ear.  Avoid swimming in lakes, dirty water, or pools that may not have the right amount of a chemical called chlorine. Contact a doctor if:  You have a fever.  Your ear is still red, swollen, or painful after 3 days.  You still have pus coming from your ear after 3 days.  Your redness, swelling, or pain gets worse.  You have a really bad headache.  You have redness, swelling, pain, or tenderness behind your ear. Summary  Otitis externa is an infection of the outer ear canal.  Symptoms include pain, redness, and swelling of the ear.  If you were given antibiotic ear drops, use them as told by your doctor. Do not stop using them even if your condition gets better.  Try not to scratch or put things in your ear. This information is not intended to replace advice given to you by your health care provider. Make sure you discuss any questions you have with your health care provider. Document Revised: 04/09/2018 Document Reviewed: 04/09/2018 Elsevier Patient Education  2021 [employer].

## 2020-11-30 NOTE — Progress Notes (Signed)
Patient ID: Kristine Alexander, female    DOB: 02-25-1956, 65 y.o.   MRN: [id number]  PCP: Danelle Berry, PA-C  Chief Complaint  Patient presents with  . Ear Pain    Left worst then right     Subjective:   Kristine Alexander is a 65 y.o. female, presents to clinic with CC of the following: Pt presents with family member interpreting Punjabi for her  HPI  Pt presents for f/up on otalgia, was seen on 11/19/2020 by Dr. Rexene Edison for same complaint.  Ear pain had been bilateral and ongoing for 2 weeks at time of eval, PE noted some left canal erythema and mild edema otherwise b/l ears, TM, canal unremarkable.  Treated with oral augmentin.  Some presence of cerumen partially obstructing TM but not impacted and not removed due to inflammation/pain. She returns today to have her left ear rechecked, there are multiple notes that her concern is worsening pain to her left ear however in exam room she states that she has not been having any pain but notes some tingling in the ear She did complete the antibiotics She has no tenderness or swelling to her external ear no discharge Hearing is grossly normal She has had some fevers on and off but she cannot tell me the temperature and she does deny any cough, congestion, sore throat, body aches, no headaches worse than her normal  Reviewed her leg pain and statin medications  Patient has had the same complaint of intermittent leg pain which is closely correlated with her degree of physical activity, in the past we have worked up for the leg pain, done labs, held and resumed statins and I did not have any effect on frequency or severity of her leg pain.  She was encouraged to restart her statin medication    Patient Active Problem List   Diagnosis Date Noted  . Mass of upper lobe of lung 02/29/2020  . Thoracic aorta atherosclerosis (HCC) 08/17/2019  . Hyperlipidemia associated with type 2 diabetes mellitus (HCC) 08/17/2019  . Mediastinal lymphadenopathy  08/17/2019  . Lung nodule, multiple 08/17/2019  . Aortic systolic murmur on examination 08/17/2019  . S/P cholecystectomy 08/17/2019  . Hepatic steatosis 03/22/2019  . Prediabetes 08/20/2017  . Anemia 08/20/2017  . Vitamin B12 deficiency 08/20/2017  . Cardiac murmur 09/18/2015  . Class 1 obesity with serious comorbidity and body mass index (BMI) of 33.0 to 33.9 in adult 05/16/2015  . Osteoarthrosis, generalized, multiple joints 05/16/2015  . Hyperlipidemia 05/16/2015      Current Outpatient Medications:  .  atorvastatin (LIPITOR) 10 MG tablet, TAKE 1 TABLET BY MOUTH DAILY, Disp: 90 tablet, Rfl: 3 .  metFORMIN (GLUCOPHAGE-XR) 500 MG 24 hr tablet, TAKE 2 TABLETS(1000 MG) BY MOUTH DAILY, Disp: 180 tablet, Rfl: 3 .  amoxicillin-clavulanate (AUGMENTIN) 875-125 MG tablet, Take 1 tablet by mouth 2 (two) times daily. (Patient not taking: Reported on 11/30/2020), Disp: 20 tablet, Rfl: 0   No Known Allergies   Social History   Tobacco Use  . Smoking status: Never Smoker  . Smokeless tobacco: Never Used  Vaping Use  . Vaping Use: Never used  Substance Use Topics  . Alcohol use: No    Alcohol/week: 0.0 standard drinks  . Drug use: No      Chart Review Today: I personally reviewed active problem list, medication list, allergies, family history, social history, health maintenance, notes from last encounter, lab results, imaging with the patient/caregiver today.   Review of Systems  Constitutional:  Negative.   HENT: Negative.   Eyes: Negative.   Respiratory: Negative.   Cardiovascular: Negative.   Gastrointestinal: Negative.   Endocrine: Negative.   Genitourinary: Negative.   Musculoskeletal: Negative.   Skin: Negative.   Allergic/Immunologic: Negative.   Neurological: Negative.   Hematological: Negative.   Psychiatric/Behavioral: Negative.   All other systems reviewed and are negative.      Objective:   Vitals:   11/30/20 1147  BP: 124/72  Pulse: 94  Resp: 16   Temp: 98.5 F (36.9 C)  TempSrc: Oral  SpO2: 98%  Weight: 153 lb 8 oz (69.6 kg)  Height: 5' (1.524 m)    Body mass index is 29.98 kg/m.  Physical Exam Vitals and nursing note reviewed.  Constitutional:      General: She is not in acute distress.    Appearance: Normal appearance. She is well-developed. She is not ill-appearing, toxic-appearing or diaphoretic.     Interventions: Face mask in place.  HENT:     Head: Normocephalic and atraumatic.     Right Ear: Hearing and external ear normal. There is no impacted cerumen. No mastoid tenderness.     Left Ear: Hearing, tympanic membrane and external ear normal. No drainage or tenderness.  No middle ear effusion. There is no impacted cerumen. No mastoid tenderness.     Ears:     Comments: Very minimal distal canal injection on left Normal canal and visible portions of TM on right with cerumen obstruction large amount of TM    Nose: Congestion present. No rhinorrhea.     Mouth/Throat:     Mouth: Mucous membranes are moist.     Pharynx: No oropharyngeal exudate or posterior oropharyngeal erythema.  Eyes:     General: Lids are normal. No scleral icterus.       Right eye: No discharge.        Left eye: No discharge.     Conjunctiva/sclera: Conjunctivae normal.     Pupils: Pupils are equal, round, and reactive to light.  Neck:     Trachea: Phonation normal. No tracheal deviation.  Cardiovascular:     Rate and Rhythm: Normal rate and regular rhythm.     Pulses: Normal pulses.          Radial pulses are 2+ on the right side and 2+ on the left side.       Posterior tibial pulses are 2+ on the right side and 2+ on the left side.     Heart sounds: Normal heart sounds. No murmur heard. No friction rub. No gallop.   Pulmonary:     Effort: Pulmonary effort is normal. No respiratory distress.     Breath sounds: Normal breath sounds. No stridor. No wheezing, rhonchi or rales.  Chest:     Chest wall: No tenderness.  Abdominal:      General: Bowel sounds are normal. There is no distension.     Palpations: Abdomen is soft.  Musculoskeletal:        General: No tenderness.     Right lower leg: No edema.     Left lower leg: No edema.  Lymphadenopathy:     Head:     Right side of head: No submental, submandibular, tonsillar, preauricular, posterior auricular or occipital adenopathy.     Left side of head: No submental, submandibular, tonsillar, preauricular, posterior auricular or occipital adenopathy.     Cervical: No cervical adenopathy.  Skin:    General: Skin is warm and dry.  Coloration: Skin is not jaundiced or pale.     Findings: No rash.  Neurological:     Mental Status: She is alert.     Motor: No abnormal muscle tone.     Gait: Gait normal.  Psychiatric:        Mood and Affect: Mood normal.        Speech: Speech normal.        Behavior: Behavior normal.      Results for orders placed or performed in visit on 08/31/20  CBC with Differential/Platelet  Result Value Ref Range   WBC 9.1 3.8 - 10.8 Thousand/uL   RBC 4.55 3.80 - 5.10 Million/uL   Hemoglobin 11.6 (L) 11.7 - 15.5 g/dL   HCT 16.135.8 09.635.0 - 04.545.0 %   MCV 78.7 (L) 80.0 - 100.0 fL   MCH 25.5 (L) 27.0 - 33.0 pg   MCHC 32.4 32.0 - 36.0 g/dL   RDW 40.914.4 81.111.0 - 91.415.0 %   Platelets 274 140 - 400 Thousand/uL   MPV 10.0 7.5 - 12.5 fL   Neutro Abs 5,378 1,500 - 7,800 cells/uL   Lymphs Abs 2,930 850 - 3,900 cells/uL   Absolute Monocytes 637 200 - 950 cells/uL   Eosinophils Absolute 127 15 - 500 cells/uL   Basophils Absolute 27 0 - 200 cells/uL   Neutrophils Relative % 59.1 %   Total Lymphocyte 32.2 %   Monocytes Relative 7.0 %   Eosinophils Relative 1.4 %   Basophils Relative 0.3 %  COMPLETE METABOLIC PANEL WITH GFR  Result Value Ref Range   Glucose, Bld 99 65 - 99 mg/dL   BUN 15 7 - 25 mg/dL   Creat 7.820.59 9.560.50 - 2.130.99 mg/dL   GFR, Est Non African American 97 > OR = 60 mL/min/1.1373m2   GFR, Est African American 112 > OR = 60 mL/min/1.2273m2    BUN/Creatinine Ratio NOT APPLICABLE 6 - 22 (calc)   Sodium 139 135 - 146 mmol/L   Potassium 4.0 3.5 - 5.3 mmol/L   Chloride 105 98 - 110 mmol/L   CO2 28 20 - 32 mmol/L   Calcium 9.7 8.6 - 10.4 mg/dL   Total Protein 7.4 6.1 - 8.1 g/dL   Albumin 4.0 3.6 - 5.1 g/dL   Globulin 3.4 1.9 - 3.7 g/dL (calc)   AG Ratio 1.2 1.0 - 2.5 (calc)   Total Bilirubin 0.3 0.2 - 1.2 mg/dL   Alkaline phosphatase (APISO) 55 37 - 153 U/L   AST 17 10 - 35 U/L   ALT 12 6 - 29 U/L  Lipid panel  Result Value Ref Range   Cholesterol 157 <200 mg/dL   HDL 49 (L) > OR = 50 mg/dL   Triglycerides 63 <086<150 mg/dL   LDL Cholesterol (Calc) 93 mg/dL (calc)   Total CHOL/HDL Ratio 3.2 <5.0 (calc)   Non-HDL Cholesterol (Calc) 108 <130 mg/dL (calc)  Hemoglobin [id number]  Result Value Ref Range   Hgb A1c MFr Bld 5.8 (H) <5.7 % of total Hgb   Mean Plasma Glucose 120 (calc)   eAG (mmol/L) 6.6 (calc)       Assessment & Plan:     ICD-10-CM   1. Left ear pain  H92.02    TM clear, mild deep canal erythema - trial abx/steroid drops, tx for possibe ETD?  2. Hyperlipidemia associated with type 2 diabetes mellitus (HCC)  E11.69    E78.5    encouraged to resume statin - refill sent - f/up appt planned, encouraged to  do every other day dosing is she has SE  3. Pain of lower extremity, unspecified laterality  M79.606    recurrent, intermittent worked up previously, has not been better or worse related to statin use or holding        Danelle Berry, PA-C 11/30/20 11:54 AM

## 2020-12-18 ENCOUNTER — Telehealth: Payer: Self-pay

## 2020-12-18 DIAGNOSIS — H9202 Otalgia, left ear: Secondary | ICD-10-CM

## 2020-12-18 NOTE — Telephone Encounter (Signed)
Order put in sysyem

## 2020-12-18 NOTE — Telephone Encounter (Signed)
Copied from CRM [id number]. Topic: General - Inquiry >> Dec 18, 2020 12:51 PM Daphine Deutscher D wrote: Reason for CRM: pt's daughter called saying mom was treated for ear pain and she is still having bad ear pain.  She would like to be referred to ENT   CB#  [phone]

## 2021-01-04 ENCOUNTER — Other Ambulatory Visit: Payer: Self-pay | Admitting: Otolaryngology

## 2021-01-04 DIAGNOSIS — H9312 Tinnitus, left ear: Secondary | ICD-10-CM

## 2021-01-18 ENCOUNTER — Ambulatory Visit: Payer: 59

## 2021-01-29 ENCOUNTER — Ambulatory Visit: Admission: RE | Admit: 2021-01-29 | Payer: 59 | Source: Ambulatory Visit

## 2021-02-12 ENCOUNTER — Ambulatory Visit
Admission: RE | Admit: 2021-02-12 | Discharge: 2021-02-12 | Disposition: A | Discharge status: Home / Self Care | Payer: 59 | Source: Ambulatory Visit | Attending: Otolaryngology | Admitting: Otolaryngology

## 2021-02-12 ENCOUNTER — Other Ambulatory Visit: Payer: Self-pay

## 2021-02-12 DIAGNOSIS — H9312 Tinnitus, left ear: Secondary | ICD-10-CM | POA: Diagnosis not present

## 2021-02-12 MED ORDER — GADOBUTROL 1 MMOL/ML IV SOLN
7.5000 mL | Freq: Once | INTRAVENOUS | Status: AC | PRN
  Administered 2021-02-12: 6 mL via INTRAVENOUS

## 2021-03-12 ENCOUNTER — Ambulatory Visit (INDEPENDENT_AMBULATORY_CARE_PROVIDER_SITE_OTHER): Payer: 59 | Admitting: Family Medicine

## 2021-03-12 ENCOUNTER — Other Ambulatory Visit: Payer: Self-pay

## 2021-03-12 ENCOUNTER — Encounter: Payer: Self-pay | Admitting: Family Medicine

## 2021-03-12 VITALS — BP 122/68 | HR 98 | Temp 98.1°F | Resp 16 | Ht 61.0 in | Wt 153.9 lb

## 2021-03-12 DIAGNOSIS — E119 Type 2 diabetes mellitus without complications: Secondary | ICD-10-CM | POA: Diagnosis not present

## 2021-03-12 DIAGNOSIS — E2839 Other primary ovarian failure: Secondary | ICD-10-CM

## 2021-03-12 DIAGNOSIS — Z5181 Encounter for therapeutic drug level monitoring: Secondary | ICD-10-CM

## 2021-03-12 DIAGNOSIS — Z780 Asymptomatic menopausal state: Secondary | ICD-10-CM | POA: Diagnosis not present

## 2021-03-12 DIAGNOSIS — I700 Atherosclerosis of aorta: Secondary | ICD-10-CM

## 2021-03-12 DIAGNOSIS — E1169 Type 2 diabetes mellitus with other specified complication: Secondary | ICD-10-CM | POA: Diagnosis not present

## 2021-03-12 DIAGNOSIS — E785 Hyperlipidemia, unspecified: Secondary | ICD-10-CM

## 2021-03-12 DIAGNOSIS — D649 Anemia, unspecified: Secondary | ICD-10-CM

## 2021-03-12 DIAGNOSIS — M79604 Pain in right leg: Secondary | ICD-10-CM

## 2021-03-12 DIAGNOSIS — Z1211 Encounter for screening for malignant neoplasm of colon: Secondary | ICD-10-CM

## 2021-03-12 DIAGNOSIS — Z1231 Encounter for screening mammogram for malignant neoplasm of breast: Secondary | ICD-10-CM

## 2021-03-12 NOTE — Progress Notes (Signed)
Name: Kristine Alexander   MRN: [id number]    DOB: [birth year]   Date:03/12/2021       Progress Note  Chief Complaint  Patient presents with   Follow-up   Diabetes   Hyperlipidemia   Anemia     Subjective:   Kristine Alexander is a 65 y.o. female, presents to clinic for routine f/up  Patient is a 64 year old female with past medical history for hyperlipidemia, aortic systolic murmur, hepatic steatosis, prediabetes, anemia, obesity, osteoarthrosis and more recently cholelithiasis requiring cholecystectomy.  CT abdomen pelvis revealed a nodular-like structure in right upper lobe that warranted follow-up.  Ct chest 03/2020:  IMPRESSION: 1. The dominant pleural-based nodularity in the right upper lobe is unchanged in volume compared to the prior exam, currently 8 by 6 by 5 mm, previously 8 by 5 by 6 mm. Stable small additional bilateral pulmonary nodules. If the patient is low risk for lung cancer, then consider additional CT in 8-14 months. If the patient is considered high risk for lung cancer, then CT in 8-14 months would be formerly recommended. This recommendation follows the consensus statement: Guidelines for Management of Incidental Pulmonary Nodules Detected on CT Images: From the Fleischner Society 2017; Radiology 2017; 284:228-243. 2. Stable borderline enlarged subcarinal lymph node, 1.4 cm in short axis, previously 1.3 cm by my measurements. 3. Faintly calcified small AP window and bilateral hilar and infrahilar lymph nodes, compatible with prior granulomatous disease. 4. Aortic atherosclerosis.   Aortic Atherosclerosis (ICD10-I70.0).     Electronically Signed   By: Gaylyn Rong M.D.   On: 03/21/2020 09:03  Pt has f/up CT May 2022   HLD - taking lipitor 10 mg daily Last labs Lab Results  Component Value Date   CHOL 157 08/31/2020   HDL 49 (L) 08/31/2020   LDLCALC 93 08/31/2020   TRIG 63 08/31/2020   CHOLHDL 3.2 08/31/2020   DM:   Pt managing DM with  metformin Reports good med compliance Pt has no SE from meds. Blood sugars not monitoring Denies: Polyuria, polydipsia, vision changes, neuropathy, hypoglycemia Recent pertinent labs: Lab Results  Component Value Date   HGBA1C 5.8 (H) 08/31/2020   HGBA1C 5.8 (H) 02/29/2020   HGBA1C 5.9 (H) 08/17/2019   Standard of care and health maintenance: Urine Microalbumin:  Due  Foot exam:  due DM eye exam:   ACEI/ARB:  Not taking Statin:  Yes  Due for most CA screening Health Maintenance  Topic Date Due   MAMMOGRAM  Never done   DEXA SCAN  Never done   PNA vac Low Risk Adult (1 of 2 - PCV13) Never done   FOOT EXAM  02/28/2021   URINE MICROALBUMIN  02/28/2021   HEMOGLOBIN A1C  03/01/2021   PAP SMEAR-Modifier  03/12/2021 (Originally 10/28/2017)   COVID-19 Vaccine (1) 03/28/2021 (Originally 12/01/1960)   COLONOSCOPY (Pts 45-2yrs Insurance coverage will need to be confirmed)  08/31/2021 (Originally 12/01/2000)   OPHTHALMOLOGY EXAM  03/29/2021   INFLUENZA VACCINE  06/17/2021   TETANUS/TDAP  01/19/2029   Hepatitis C Screening  Completed   HIV Screening  Completed   HPV VACCINES  Aged Out        Current Outpatient Medications:    atorvastatin (LIPITOR) 10 MG tablet, TAKE 1 TABLET BY MOUTH DAILY, Disp: 90 tablet, Rfl: 3   metFORMIN (GLUCOPHAGE-XR) 500 MG 24 hr tablet, TAKE 2 TABLETS(1000 MG) BY MOUTH DAILY, Disp: 180 tablet, Rfl: 3  Patient Active Problem List   Diagnosis Date Noted   Mass of  upper lobe of lung 02/29/2020   Thoracic aorta atherosclerosis (HCC) 08/17/2019   Hyperlipidemia associated with type 2 diabetes mellitus (HCC) 08/17/2019   Mediastinal lymphadenopathy 08/17/2019   Lung nodule, multiple 08/17/2019   Aortic systolic murmur on examination 08/17/2019   S/P cholecystectomy 08/17/2019   Hepatic steatosis 03/22/2019   Prediabetes 08/20/2017   Anemia 08/20/2017   Vitamin B12 deficiency 08/20/2017   Cardiac murmur 09/18/2015   Class 1 obesity with serious  comorbidity and body mass index (BMI) of 33.0 to 33.9 in adult 05/16/2015   Osteoarthrosis, generalized, multiple joints 05/16/2015   Hyperlipidemia 05/16/2015    Past Surgical History:  Procedure Laterality Date   CATARACT EXTRACTION W/PHACO Right 02/25/2016   Procedure: CATARACT EXTRACTION PHACO AND INTRAOCULAR LENS PLACEMENT (IOC);  Surgeon: Sallee LangeSteven Dingeldein, MD;  Location: [facility];  Service: Ophthalmology;  Laterality: Right;  US    00:48.2 AP%   23.5 CDE   22.58 fluid pack lot # [id number]   CATARACT EXTRACTION W/PHACO Left 06/08/2018   Procedure: CATARACT EXTRACTION PHACO AND INTRAOCULAR LENS PLACEMENT (IOC);  Surgeon: Galen ManilaPorfilio, William, MD;  Location: [facility];  Service: Ophthalmology;  Laterality: Left;  US 00:28.4 AP% 13.0 CDE 3.68 Fluid pack Lot # [id number]   CHOLECYSTECTOMY N/A 05/19/2019   Procedure: LAPAROSCOPIC CHOLECYSTECTOMY;  Surgeon: Duanne Guessannon, Jennifer, MD;  Location: [facility];  Service: General;  Laterality: N/A;   TUBAL LIGATION      Family History  Problem Relation Age of Onset   Hyperlipidemia Mother    Hyperlipidemia Father    Hyperlipidemia Sister    Diabetes Brother    Hyperlipidemia Brother     Social History   Tobacco Use   Smoking status: Never Smoker   Smokeless tobacco: Never Used  Building services engineerVaping Use   Vaping Use: Never used  Substance Use Topics   Alcohol use: No    Alcohol/week: 0.0 standard drinks   Drug use: No     No Known Allergies  Health Maintenance  Topic Date Due   MAMMOGRAM  Never done   DEXA SCAN  Never done   PNA vac Low Risk Adult (1 of 2 - PCV13) Never done   FOOT EXAM  02/28/2021   URINE MICROALBUMIN  02/28/2021   HEMOGLOBIN A1C  03/01/2021   PAP SMEAR-Modifier  03/12/2021 (Originally 10/28/2017)   COVID-19 Vaccine (1) 03/28/2021 (Originally 12/01/1960)   COLONOSCOPY (Pts 45-8716yrs Insurance coverage will need to be confirmed)  08/31/2021 (Originally 12/01/2000)   OPHTHALMOLOGY EXAM  03/29/2021   INFLUENZA VACCINE  06/17/2021    TETANUS/TDAP  01/19/2029   Hepatitis C Screening  Completed   HIV Screening  Completed   HPV VACCINES  Aged Out    Chart Review Today: I personally reviewed active problem list, medication list, allergies, family history, social history, health maintenance, notes from last encounter, lab results, imaging with the patient/caregiver today. Chart extensively reviewed as noted above in HPI  Review of Systems  Constitutional: Negative.   HENT: Negative.    Eyes: Negative.   Respiratory: Negative.    Cardiovascular: Negative.   Gastrointestinal: Negative.   Endocrine: Negative.   Genitourinary: Negative.   Musculoskeletal: Negative.   Skin: Negative.   Allergic/Immunologic: Negative.   Neurological: Negative.   Hematological: Negative.   Psychiatric/Behavioral: Negative.    All other systems reviewed and are negative.   Objective:   Vitals:   03/12/21 1515  BP: 122/68  Pulse: 98  Resp: 16  Temp: 98.1 F (36.7 C)  SpO2: 99%  Weight: 153 lb  14.4 oz (69.8 kg)  Height: 5\' 1"  (1.549 m)    Body mass index is 29.08 kg/m.  Physical Exam Vitals and nursing note reviewed.  Constitutional:      General: She is not in acute distress.    Appearance: Normal appearance. She is well-developed. She is obese. She is not ill-appearing, toxic-appearing or diaphoretic.     Interventions: Face mask in place.  HENT:     Head: Normocephalic and atraumatic.     Right Ear: External ear normal.     Left Ear: External ear normal.     Mouth/Throat:     Mouth: Mucous membranes are moist.     Pharynx: Oropharynx is clear.  Eyes:     General: Lids are normal. No scleral icterus.       Right eye: No discharge.        Left eye: No discharge.     Conjunctiva/sclera: Conjunctivae normal.     Pupils: Pupils are equal, round, and reactive to light.  Neck:     Trachea: Phonation normal. No tracheal deviation.  Cardiovascular:     Rate and Rhythm: Normal rate and regular rhythm.     Pulses:  Normal pulses.          Radial pulses are 2+ on the right side and 2+ on the left side.       Posterior tibial pulses are 2+ on the right side and 2+ on the left side.     Heart sounds: Normal heart sounds. No murmur heard.   No friction rub. No gallop.  Pulmonary:     Effort: Pulmonary effort is normal. No respiratory distress.     Breath sounds: Normal breath sounds. No stridor. No wheezing, rhonchi or rales.  Chest:     Chest wall: No tenderness.  Abdominal:     General: Bowel sounds are normal. There is no distension.     Palpations: Abdomen is soft.     Tenderness: There is no abdominal tenderness. There is no right CVA tenderness, left CVA tenderness or guarding.  Musculoskeletal:        General: No tenderness.     Cervical back: Normal range of motion and neck supple.     Right lower leg: No edema.     Left lower leg: No edema.  Skin:    General: Skin is warm and dry.     Capillary Refill: Capillary refill takes less than 2 seconds.     Coloration: Skin is not jaundiced or pale.     Findings: No bruising or rash.  Neurological:     Mental Status: She is alert. Mental status is at baseline.     Motor: No abnormal muscle tone.     Gait: Gait normal.  Psychiatric:        Mood and Affect: Mood normal.        Speech: Speech normal.        Behavior: Behavior normal.        Assessment & Plan:     ICD-10-CM   1. Hyperlipidemia associated with type 2 diabetes mellitus (HCC)  E11.69 COMPLETE METABOLIC PANEL WITH GFR   E78.5    encouraged to continue statin    2. Type 2 diabetes mellitus without complication, without long-term current use of insulin (HCC)  E11.9 Microalbumin, urine    Hemoglobin A1C    COMPLETE METABOLIC PANEL WITH GFR   well controlled    3. Encounter for screening mammogram for malignant neoplasm of  breast  Z12.31 MM 3D SCREEN BREAST BILATERAL    4. Postmenopausal estrogen deficiency  Z78.0 DG Bone Density    5. Screening for malignant neoplasm of  colon  Z12.11 Fecal Globin By Immunochemistry    Fecal Globin By Immunochemistry    6. Anemia, unspecified type  D64.9 CBC with Differential/Platelet    Fecal Globin By Immunochemistry   monitoring, do FIT or hemoccult again if any decrease in H/H    7. Thoracic aorta atherosclerosis (HCC) Chronic I70.0    on statin monitoring    8. Anterior leg pain, right  M79.604 Ambulatory referral to Sports Medicine   recurrent w/o injury or any concerning or notable physical exam findings, she held statin in the past, no change, sports med or PT eval? pain after activity    9. Medication monitoring encounter  Z51.81 Microalbumin, urine    Hemoglobin A1C    COMPLETE METABOLIC PANEL WITH GFR    CBC with Differential/Platelet       Return in about 6 months (around 09/11/2021) for welcome to medicare? in the next 6 months with provider, routine 6 month f/up HLD DM .   Danelle Berry, PA-C 03/12/21 3:20 PM

## 2021-03-12 NOTE — Patient Instructions (Signed)
See the list below of things needed to help you remain in good health  Recommend you do the follow up CT chest   Call [facility] and get your mammogram and bone density scheduled [facility] at [facility] [address] [place],  [place]  [address] Get Driving Directions Main: [phone]   Return the FIT test to make sure you have no blood loss in your GI system (history of anemia and due for colon cancer screening)  Health Maintenance  Topic Date Due  . Mammogram  Never done  . DEXA scan (bone density measurement)  Never done  . Pneumonia vaccines (1 of 2 - PCV13) Never done  . Complete foot exam   02/28/2021  . Urine Protein Check  02/28/2021  . Hemoglobin A1C  03/01/2021  . Pap Smear  03/12/2021*  . COVID-19 Vaccine (1) 03/28/2021*  . Colon Cancer Screening  08/31/2021*  . Eye exam for diabetics  03/29/2021  . Flu Shot  06/17/2021  . Tetanus Vaccine  01/19/2029  .  Hepatitis C: One time screening is recommended by Center for Disease Control  (CDC) for  adults born from [birth year] through 1965.   Completed  . HIV Screening  Completed  . HPV Vaccine  Aged Out  *Topic was postponed. The date shown is not the original due date.

## 2021-03-13 ENCOUNTER — Other Ambulatory Visit: Payer: Self-pay | Admitting: Family Medicine

## 2021-03-13 LAB — COMPLETE METABOLIC PANEL WITH GFR
AG Ratio: 1.2 (calc) (ref 1.0–2.5)
ALT: 14 U/L (ref 6–29)
AST: 19 U/L (ref 10–35)
Albumin: 4.2 g/dL (ref 3.6–5.1)
Alkaline phosphatase (APISO): 57 U/L (ref 37–153)
BUN: 18 mg/dL (ref 7–25)
CO2: 28 mmol/L (ref 20–32)
Calcium: 9.6 mg/dL (ref 8.6–10.4)
Chloride: 104 mmol/L (ref 98–110)
Creat: 0.65 mg/dL (ref 0.50–0.99)
GFR, Est African American: 108 mL/min/{1.73_m2} (ref >=60)
GFR, Est Non African American: 93 mL/min/{1.73_m2} (ref >=60)
Globulin: 3.4 g/dL (calc) (ref 1.9–3.7)
Glucose, Bld: 114 mg/dL — ABNORMAL HIGH (ref 65–99)
Potassium: 4 mmol/L (ref 3.5–5.3)
Sodium: 139 mmol/L (ref 135–146)
Total Bilirubin: 0.3 mg/dL (ref 0.2–1.2)
Total Protein: 7.6 g/dL (ref 6.1–8.1)

## 2021-03-13 LAB — CBC WITH DIFFERENTIAL/PLATELET
Absolute Monocytes: 521 cells/uL (ref 200–950)
Basophils Absolute: 42 cells/uL (ref 0–200)
Basophils Relative: 0.5 %
Eosinophils Absolute: 126 cells/uL (ref 15–500)
Eosinophils Relative: 1.5 %
HCT: 36.1 % (ref 35.0–45.0)
Hemoglobin: 11.8 g/dL (ref 11.7–15.5)
Lymphs Abs: 2848 cells/uL (ref 850–3900)
MCH: 26 pg — ABNORMAL LOW (ref 27.0–33.0)
MCHC: 32.7 g/dL (ref 32.0–36.0)
MCV: 79.5 fL — ABNORMAL LOW (ref 80.0–100.0)
MPV: 10.4 fL (ref 7.5–12.5)
Monocytes Relative: 6.2 %
Neutro Abs: 4864 cells/uL (ref 1500–7800)
Neutrophils Relative %: 57.9 %
Platelets: 259 10*3/uL (ref 140–400)
RBC: 4.54 10*6/uL (ref 3.80–5.10)
RDW: 14.5 % (ref 11.0–15.0)
Total Lymphocyte: 33.9 %
WBC: 8.4 10*3/uL (ref 3.8–10.8)

## 2021-03-13 LAB — MICROALBUMIN, URINE: Microalb, Ur: 0.4 mg/dL

## 2021-03-13 LAB — HEMOGLOBIN A1C
Hgb A1c MFr Bld: 5.8 % of total Hgb — ABNORMAL HIGH (ref <5.7)
Mean Plasma Glucose: 120 mg/dL
eAG (mmol/L): 6.6 mmol/L

## 2021-03-13 MED ORDER — METFORMIN HCL ER 500 MG PO TB24: ORAL_TABLET | ORAL | 3 refills | Status: DC

## 2021-03-22 ENCOUNTER — Ambulatory Visit
Admission: RE | Admit: 2021-03-22 | Discharge: 2021-03-22 | Disposition: A | Discharge status: Home / Self Care | Payer: 59 | Source: Ambulatory Visit | Attending: Oncology | Admitting: Oncology

## 2021-03-22 ENCOUNTER — Other Ambulatory Visit: Payer: Self-pay

## 2021-03-22 DIAGNOSIS — R918 Other nonspecific abnormal finding of lung field: Secondary | ICD-10-CM | POA: Diagnosis not present

## 2021-03-25 ENCOUNTER — Inpatient Hospital Stay: Payer: 59 | Admitting: Oncology

## 2021-03-26 ENCOUNTER — Inpatient Hospital Stay: Payer: 59 | Attending: Oncology | Admitting: Oncology

## 2021-03-26 ENCOUNTER — Other Ambulatory Visit: Payer: Self-pay

## 2021-03-26 DIAGNOSIS — R918 Other nonspecific abnormal finding of lung field: Secondary | ICD-10-CM | POA: Diagnosis not present

## 2021-03-26 NOTE — Progress Notes (Signed)
Pulmonary Nodule Clinic Consult note [facility]  [phone]) [phone] [phone]  Patient Care Team: Danelle Berryapia, Leisa, Cordelia PochePA-C as PCP - General (Family Medicine)   Name of the patient: Kristine Alexander  [id number]  Jan 21, 1956   Date of visit: 03/26/2021   I connected with Kristine Alexander on 04/02/21 at  3:30 PM EDT by telephone visit and verified that I am speaking with the correct person using two identifiers.   I discussed the limitations, risks, security and privacy concerns of performing an evaluation and management service by telemedicine and the availability of in-person appointments. I also discussed with the patient that there may be a patient responsible charge related to this service. The patient expressed understanding and agreed to proceed.   Other persons participating in the visit and their role in the encounter: Daughter   Patient's location: Home Provider's location: Clinic   Diagnosis- Lung Nodule  Chief complaint/ Reason for visit- Pulmonary Nodule Clinic Follow-up Visit  Past Medical History:  Patient is managed/referred by Henry RusselLesia Tapia, PA.   Patient is a 65 year old female with past medical history for hyperlipidemia, aortic systolic murmur, hepatic steatosis, prediabetes, anemia, obesity, osteoarthrosis and more recently cholelithiasis requiring cholecystectomy.  CT abdomen pelvis revealed a nodular-like structure in right upper lobe that warranted follow-up.  CT abdomen pelvis with contrast from 05/31/2019 which revealed an incidental finding of nodular structures around the posterior aspect of the right upper lobe.  Findings most likely representing post infection of postinflammatory changes but indeterminate and they were no comparison images for review.  Repeat CT chest recommended in 3 to 6 months to confirm stability.  CT chest without contrast from 03/20/2020 revealed dominant pleural-based nodularity in the right upper lobe remains  unchanged from prior exam currently 8 x 6 x 5 mm in size.  Stable small additional bilateral pulmonary nodules.  If the patient is low risk for lung cancer then consider additional CT scan in 8 to 14 months.  Interval history-patient presents today to review CT scan from 03/24/21.  Per review of past medical history she has never smoked or been exposed to second hand smoke.  She denies any previous work exposures that could predispose her to lung cancer.  She denies any personal or family history of cancer.  She currently is doing well.  She continues to live at home with her husband.  She is very active.  She continues to cook all meals for her family and cleans the house.  Her daughter states "she needs to slow down".  She is currently retired.  She has not worked in many years.  She denies any respiratory concerns.  She denies chest pain or shortness of breath.  She denies any diarrhea or constipation.  ECOG FS:0 - Asymptomatic  Review of systems- Review of Systems  Constitutional: Negative.  Negative for chills, fever, malaise/fatigue and weight loss.  HENT: Negative for congestion, ear pain and tinnitus.   Eyes: Negative.  Negative for blurred vision and double vision.  Respiratory: Negative.  Negative for cough, sputum production and shortness of breath.   Cardiovascular: Negative.  Negative for chest pain, palpitations and leg swelling.  Gastrointestinal: Negative.  Negative for abdominal pain, constipation, diarrhea, nausea and vomiting.  Genitourinary: Negative for dysuria, frequency and urgency.  Musculoskeletal: Negative for back pain and falls.  Skin: Negative.  Negative for rash.  Neurological: Negative.  Negative for weakness and headaches.  Endo/Heme/Allergies: Negative.  Does not bruise/bleed easily.  Psychiatric/Behavioral: Negative.  Negative for  depression. The patient is not nervous/anxious and does not have insomnia.      No Known Allergies   Past Medical History:   Diagnosis Date  . Cholelithiases 03/22/2019  . Diabetes mellitus without complication (HCC)   . Hyperlipidemia   . Iron deficiency anemia   . Microcytosis 03/01/2017   chronic  . Numbness 08/18/2017  . Osteoarthritis of multiple joints   . Prediabetes 08/20/2017     Past Surgical History:  Procedure Laterality Date  . CATARACT EXTRACTION W/PHACO Right 02/25/2016   Procedure: CATARACT EXTRACTION PHACO AND INTRAOCULAR LENS PLACEMENT (IOC);  Surgeon: Sallee Lange, MD;  Location: [facility] ORS;  Service: Ophthalmology;  Laterality: Right;  [place]    00:48.2 AP%   23.5 CDE   22.58 fluid pack lot # [id number]  . CATARACT EXTRACTION W/PHACO Left 06/08/2018   Procedure: CATARACT EXTRACTION PHACO AND INTRAOCULAR LENS PLACEMENT (IOC);  Surgeon: Galen Manila, MD;  Location: [facility] ORS;  Service: Ophthalmology;  Laterality: Left;  [place] 00:28.4 AP% 13.0 CDE 3.68 Fluid pack Lot # [id number]  . CHOLECYSTECTOMY N/A 05/19/2019   Procedure: LAPAROSCOPIC CHOLECYSTECTOMY;  Surgeon: Duanne Guess, MD;  Location: [facility] ORS;  Service: General;  Laterality: N/A;  . TUBAL LIGATION      Social History   Socioeconomic History  . Marital status: Married    Spouse name: Not on file  . Number of children: Not on file  . Years of education: Not on file  . Highest education level: Not on file  Occupational History  . Not on file  Tobacco Use  . Smoking status: Never Smoker  . Smokeless tobacco: Never Used  Vaping Use  . Vaping Use: Never used  Substance and Sexual Activity  . Alcohol use: No    Alcohol/week: 0.0 standard drinks  . Drug use: No  . Sexual activity: Not Currently  Other Topics Concern  . Not on file  Social History Narrative  . Not on file   Social Determinants of Health   Financial Resource Strain: Low Risk   . Difficulty of Paying Living Expenses: Not hard at all  Food Insecurity: No Food Insecurity  . Worried About Programme researcher, broadcasting/film/video in the Last Year: Never true  . Ran Out of Food  in the Last Year: Never true  Transportation Needs: No Transportation Needs  . Lack of Transportation (Medical): No  . Lack of Transportation (Non-Medical): No  Physical Activity: Insufficiently Active  . Days of Exercise per Week: 5 days  . Minutes of Exercise per Session: 10 min  Stress: No Stress Concern Present  . Feeling of Stress : Only a little  Social Connections: Moderately Integrated  . Frequency of Communication with Friends and Family: Once a week  . Frequency of Social Gatherings with Friends and Family: Once a week  . Attends Religious Services: 1 to 4 times per year  . Active Member of Clubs or Organizations: Yes  . Attends Banker Meetings: Never  . Marital Status: Married  Catering manager Violence: Not At Risk  . Fear of Current or Ex-Partner: No  . Emotionally Abused: No  . Physically Abused: No  . Sexually Abused: No    Family History  Problem Relation Age of Onset  . Hyperlipidemia Mother   . Hyperlipidemia Father   . Hyperlipidemia Sister   . Diabetes Brother   . Hyperlipidemia Brother      Current Outpatient Medications:  .  atorvastatin (LIPITOR) 10 MG tablet, TAKE 1 TABLET BY MOUTH  DAILY, Disp: 90 tablet, Rfl: 3 .  metFORMIN (GLUCOPHAGE-XR) 500 MG 24 hr tablet, TAKE 2 TABLETS(1000 MG) BY MOUTH DAILY, Disp: 180 tablet, Rfl: 3  Physical exam: There were no vitals filed for this visit. Physical Exam Constitutional:      Appearance: Normal appearance.  HENT:     Head: Normocephalic and atraumatic.  Eyes:     Pupils: Pupils are equal, round, and reactive to light.  Cardiovascular:     Rate and Rhythm: Normal rate and regular rhythm.     Heart sounds: Normal heart sounds. No murmur heard.   Pulmonary:     Effort: Pulmonary effort is normal.     Breath sounds: Normal breath sounds. No wheezing.  Abdominal:     General: Bowel sounds are normal. There is no distension.     Palpations: Abdomen is soft.     Tenderness: There is no  abdominal tenderness.  Musculoskeletal:        General: Normal range of motion.     Cervical back: Normal range of motion.  Skin:    General: Skin is warm and dry.     Findings: No rash.  Neurological:     Mental Status: She is alert and oriented to person, place, and time.  Psychiatric:        Judgment: Judgment normal.      CMP Latest Ref Rng & Units 03/12/2021  Glucose 65 - 99 mg/dL 240(X)  BUN 7 - 25 mg/dL 18  Creatinine 7.35 - 3.29 mg/dL 9.24  Sodium 268 - 341 mmol/L 139  Potassium 3.5 - 5.3 mmol/L 4.0  Chloride 98 - 110 mmol/L 104  CO2 20 - 32 mmol/L 28  Calcium 8.6 - 10.4 mg/dL 9.6  Total Protein 6.1 - 8.1 g/dL 7.6  Total Bilirubin 0.2 - 1.2 mg/dL 0.3  Alkaline Phos 38 - 126 U/L -  AST 10 - 35 U/L 19  ALT 6 - 29 U/L 14   CBC Latest Ref Rng & Units 03/12/2021  WBC 3.8 - 10.8 Thousand/uL 8.4  Hemoglobin 11.7 - 15.5 g/dL 96.2  Hematocrit 22.9 - 45.0 % 36.1  Platelets 140 - 400 Thousand/uL 259    No images are attached to the encounter.  CT Chest Wo Contrast  Result Date: 03/24/2021 CLINICAL DATA:  Follow up pulmonary nodule. EXAM: CT CHEST WITHOUT CONTRAST TECHNIQUE: Multidetector CT imaging of the chest was performed following the standard protocol without IV contrast. COMPARISON:  Chest CT 03/20/2020 and 05/31/2019. FINDINGS: Cardiovascular: Mild aortic and coronary artery atherosclerosis. The heart size is normal. There is no pericardial effusion. Mediastinum/Nodes: Multiple small faintly calcified mediastinal and hilar lymph nodes are again noted, not significantly changed. There are small axillary lymph nodes bilaterally. No progressive adenopathy identified. The thyroid gland, trachea and esophagus demonstrate no significant findings. Lungs/Pleura: No pleural effusion or pneumothorax. Stable pleural-based and subpleural nodularity posteriorly in the right upper lobe, largest measuring 8 mm on image 30/3. Parenchymal nodule seen on the same image is partially calcified.  Subpleural nodule in the right lower lobe adjacent to the esophagus on image 84/3 is stable. There is a stable tiny left upper lobe nodule on image 55/3. No new or enlarging nodules. Upper abdomen: The visualized upper abdomen appears stable without suspicious findings status post cholecystectomy. No adrenal mass. Musculoskeletal/Chest wall: There is no chest wall mass or suspicious osseous finding. Left-sided cervical rib articulating with the 1st rib noted. IMPRESSION: 1. Stable lung nodularity from 05/31/2019, consistent with a benign etiology. Per  consensus guidelines, no additional follow-up necessary. This recommendation follows the consensus statement: Guidelines for Management of Small Pulmonary Nodules Detected on CT Images: From the Fleischner Society 2017; Radiology 2017; 284:228-243. 2. Stable partially calcified mediastinal and hilar lymph nodes, suggesting prior granulomatous disease. In combination, these findings may represent sarcoidosis. 3. No suspicious pulmonary nodules or acute findings. 4. Mild coronary and Aortic Atherosclerosis (ICD10-I70.0). Electronically Signed   By: Carey Bullocks M.D.   On: 03/24/2021 17:53    Assessment and plan- Patient is a 65 y.o. female who presents to pulmonary nodule clinic for follow-up of incidental lung nodules.     CT scan from 03/22/2021 showed stable lung nodularity from 05/31/2019 consistent with benign etiology.  No additional follow-up needed.  Calculating malignancy probability of a pulmonary nodule: Risk factors include: 1.  Age. 2.  Cancer history. 3.  Diameter of pulmonary nodule and mm 4.  Location 5.  Smoking history 6.  Spiculation present   Based on risk factors, this patient is low risk for the development of lung cancer given she has never smoked.  She has know known risk factors for the development of lung cancer.  She does not meet criteria for a low-dose CT screening program.   During our visit, we discussed pulmonary nodules  are a common incidental finding and are often how lung cancer is discovered.  Lung cancer survival is directly related to the stage at diagnosis.  We discussed that nodules can vary in presentation from solitary pulmonary nodules to masses, 2 groundglass opacities and multiple nodules.  Pulmonary nodules in the majority of cases are benign but the probability of these becoming malignant cannot be undermined.  Early identification of malignant nodules could lead to early diagnosis and increased survival.   We discussed the probability of pulmonary nodules becoming malignant increase with age, pack years of tobacco use, size/characteristics of the nodule and location; with upper lobe involvement being most worrisome.   We discussed the goal of our clinic is to thoroughly evaluate each nodule, developed a comprehensive, individualized plan of care utilizing the most advanced technology and significantly reduce the time from detection to treatment.  A dedicated pulmonary nodule clinic has proven to indeed expedite the detection and treatment of lung cancer.   Patient education in fact sheet provided along with most recent CT scans.  Plan:  Reviewed recent CT chest without contrast with patient and daughter. All questions were answered. No additional follow-up needed  Disposition: Patient can be discharged from the lung nodule clinic.   Visit Diagnosis No diagnosis found.  Patient expressed understanding and was in agreement with this plan. She also understands that She can call clinic at any time with any questions, concerns, or complaints.   Greater than 50% was spent in counseling and coordination of care with this patient including but not limited to discussion of the relevant topics above (See A&P) including, but not limited to diagnosis and management of acute and chronic medical conditions.   Thank you for allowing me to participate in the care of this very pleasant patient.    Mauro Kaufmann, NP [facility] at [facility] Cell - [phone] Pager- [phone] 03/26/2021 3:21 PM

## 2021-08-27 ENCOUNTER — Ambulatory Visit: Payer: 59 | Admitting: Nurse Practitioner

## 2021-09-02 ENCOUNTER — Ambulatory Visit: Payer: 59 | Admitting: Family Medicine

## 2021-09-02 ENCOUNTER — Ambulatory Visit (INDEPENDENT_AMBULATORY_CARE_PROVIDER_SITE_OTHER): Payer: 59 | Admitting: Family Medicine

## 2021-09-02 ENCOUNTER — Encounter: Payer: Self-pay | Admitting: Family Medicine

## 2021-09-02 ENCOUNTER — Other Ambulatory Visit: Payer: Self-pay

## 2021-09-02 VITALS — BP 132/76 | HR 81 | Temp 97.8°F | Resp 16 | Ht 60.0 in | Wt 153.9 lb

## 2021-09-02 DIAGNOSIS — Z6833 Body mass index (BMI) 33.0-33.9, adult: Secondary | ICD-10-CM

## 2021-09-02 DIAGNOSIS — E1169 Type 2 diabetes mellitus with other specified complication: Secondary | ICD-10-CM

## 2021-09-02 DIAGNOSIS — E785 Hyperlipidemia, unspecified: Secondary | ICD-10-CM

## 2021-09-02 DIAGNOSIS — E6609 Other obesity due to excess calories: Secondary | ICD-10-CM

## 2021-09-02 DIAGNOSIS — R7303 Prediabetes: Secondary | ICD-10-CM | POA: Diagnosis not present

## 2021-09-02 DIAGNOSIS — Z23 Encounter for immunization: Secondary | ICD-10-CM

## 2021-09-02 DIAGNOSIS — Z1159 Encounter for screening for other viral diseases: Secondary | ICD-10-CM | POA: Diagnosis not present

## 2021-09-02 DIAGNOSIS — Z0000 Encounter for general adult medical examination without abnormal findings: Secondary | ICD-10-CM | POA: Diagnosis not present

## 2021-09-02 MED ORDER — ROSUVASTATIN CALCIUM 5 MG PO TABS: 5.0000 mg | ORAL_TABLET | Freq: Every day | ORAL | 3 refills | Status: DC

## 2021-09-02 NOTE — Progress Notes (Signed)
BP 132/76   Pulse 81   Temp 97.8 F (36.6 C)   Resp 16   Ht 5' (1.524 m)   Wt 153 lb 14.4 oz (69.8 kg)   SpO2 98%   BMI 30.06 kg/m    Subjective:    Patient ID: Kristine Alexander, female    DOB: 1956/08/29, 65 y.o.   MRN: [id number]  HPI: Kristine Alexander is a 65 y.o. female presenting on 09/02/2021 for comprehensive medical examination. Current medical complaints include: occasional muscle cramps  Diabetes, Type 2 - Last A1c 5.8 02/2021 - Medications: metformin - Compliance: good - Eye exam: UTD, [facility] - Foot exam: UTD - Statin: yes - PNA vaccine: due  HLD - medications: lipitor - compliance: good, sometimes skips a day - medication SEs: muscle cramps  Depression Screen done today and results listed below:  Depression screen [facility] 2/9 09/02/2021 03/12/2021 11/30/2020 11/19/2020 08/31/2020  Decreased Interest 0 0 0 0 0  Down, Depressed, Hopeless 0 0 0 0 0  PHQ - 2 Score 0 0 0 0 0  Altered sleeping 0 0 - - -  Tired, decreased energy 0 0 - - -  Change in appetite 0 0 - - -  Feeling bad or failure about yourself  0 0 - - -  Trouble concentrating 0 0 - - -  Moving slowly or fidgety/restless 0 0 - - -  Suicidal thoughts 0 0 - - -  PHQ-9 Score 0 0 - - -  Difficult doing work/chores Not difficult at all Not difficult at all - - -  Some recent data might be hidden    Past Medical History:  Past Medical History:  Diagnosis Date   Cholelithiases 03/22/2019   Diabetes mellitus without complication (HCC)    Hyperlipidemia    Iron deficiency anemia    Microcytosis 03/01/2017   chronic   Numbness 08/18/2017   Osteoarthritis of multiple joints    Prediabetes 08/20/2017    Surgical History:  Past Surgical History:  Procedure Laterality Date   CATARACT EXTRACTION W/PHACO Right 02/25/2016   Procedure: CATARACT EXTRACTION PHACO AND INTRAOCULAR LENS PLACEMENT (IOC);  Surgeon: Sallee Lange, MD;  Location: [facility] ORS;  Service: Ophthalmology;  Laterality: Right;  [place]     00:48.2 AP%   23.5 CDE   22.58 fluid pack lot # [id number]   CATARACT EXTRACTION W/PHACO Left 06/08/2018   Procedure: CATARACT EXTRACTION PHACO AND INTRAOCULAR LENS PLACEMENT (IOC);  Surgeon: Galen Manila, MD;  Location: [facility] ORS;  Service: Ophthalmology;  Laterality: Left;  [place] 00:28.4 AP% 13.0 CDE 3.68 Fluid pack Lot # [id number]   CHOLECYSTECTOMY N/A 05/19/2019   Procedure: LAPAROSCOPIC CHOLECYSTECTOMY;  Surgeon: Duanne Guess, MD;  Location: [facility] ORS;  Service: General;  Laterality: N/A;   TUBAL LIGATION      Medications:  Current Outpatient Medications on File Prior to Visit  Medication Sig   metFORMIN (GLUCOPHAGE-XR) 500 MG 24 hr tablet TAKE 2 TABLETS(1000 MG) BY MOUTH DAILY   No current facility-administered medications on file prior to visit.    Allergies:  No Known Allergies  Social History:  Social History   Socioeconomic History   Marital status: Married    Spouse name: Not on file   Number of children: Not on file   Years of education: Not on file   Highest education level: Not on file  Occupational History   Not on file  Tobacco Use   Smoking status: Never   Smokeless tobacco: Never  Vaping Use  Vaping Use: Never used  Substance and Sexual Activity   Alcohol use: No    Alcohol/week: 0.0 standard drinks   Drug use: No   Sexual activity: Not Currently  Other Topics Concern   Not on file  Social History Narrative   Not on file   Social Determinants of Health   Financial Resource Strain: Not on file  Food Insecurity: Not on file  Transportation Needs: Not on file  Physical Activity: Not on file  Stress: Not on file  Social Connections: Not on file  Intimate Partner Violence: Not on file   Social History   Tobacco Use  Smoking Status Never  Smokeless Tobacco Never   Social History   Substance and Sexual Activity  Alcohol Use No   Alcohol/week: 0.0 standard drinks    Family History:  Family History  Problem Relation Age of Onset    Hyperlipidemia Mother    Hyperlipidemia Father    Hyperlipidemia Sister    Diabetes Brother    Hyperlipidemia Brother     Past medical history, surgical history, medications, allergies, family history and social history reviewed with patient today and changes made to appropriate areas of the chart.      Objective:    BP 132/76   Pulse 81   Temp 97.8 F (36.6 C)   Resp 16   Ht 5' (1.524 m)   Wt 153 lb 14.4 oz (69.8 kg)   SpO2 98%   BMI 30.06 kg/m   Wt Readings from Last 3 Encounters:  09/02/21 153 lb 14.4 oz (69.8 kg)  03/12/21 153 lb 14.4 oz (69.8 kg)  11/30/20 153 lb 8 oz (69.6 kg)    Physical Exam Constitutional:      Appearance: Normal appearance.  HENT:     Head: Normocephalic.     Right Ear: External ear normal.     Left Ear: External ear normal.     Nose: Nose normal.  Eyes:     Extraocular Movements: Extraocular movements intact.  Cardiovascular:     Rate and Rhythm: Normal rate.  Pulmonary:     Effort: Pulmonary effort is normal.  Musculoskeletal:        General: Normal range of motion.     Cervical back: Normal range of motion.     Right lower leg: No edema.     Left lower leg: No edema.  Skin:    General: Skin is warm and dry.  Neurological:     Mental Status: She is alert and oriented to person, place, and time. Mental status is at baseline.  Psychiatric:        Mood and Affect: Mood normal.        Behavior: Behavior normal.    Results for orders placed or performed in visit on 03/12/21  Microalbumin, urine  Result Value Ref Range   Microalb, Ur 0.4 mg/dL   RAM    Hemoglobin P5F  Result Value Ref Range   Hgb A1c MFr Bld 5.8 (H) <5.7 % of total Hgb   Mean Plasma Glucose 120 mg/dL   eAG (mmol/L) 6.6 mmol/L  COMPLETE METABOLIC PANEL WITH GFR  Result Value Ref Range   Glucose, Bld 114 (H) 65 - 99 mg/dL   BUN 18 7 - 25 mg/dL   Creat 1.63 8.46 - 6.59 mg/dL   GFR, Est Non African American 93 > OR = 60 mL/min/1.11m2   GFR, Est African American  108 > OR = 60 mL/min/1.10m2   BUN/Creatinine Ratio NOT  APPLICABLE 6 - 22 (calc)   Sodium 139 135 - 146 mmol/L   Potassium 4.0 3.5 - 5.3 mmol/L   Chloride 104 98 - 110 mmol/L   CO2 28 20 - 32 mmol/L   Calcium 9.6 8.6 - 10.4 mg/dL   Total Protein 7.6 6.1 - 8.1 g/dL   Albumin 4.2 3.6 - 5.1 g/dL   Globulin 3.4 1.9 - 3.7 g/dL (calc)   AG Ratio 1.2 1.0 - 2.5 (calc)   Total Bilirubin 0.3 0.2 - 1.2 mg/dL   Alkaline phosphatase (APISO) 57 37 - 153 U/L   AST 19 10 - 35 U/L   ALT 14 6 - 29 U/L  CBC with Differential/Platelet  Result Value Ref Range   WBC 8.4 3.8 - 10.8 Thousand/uL   RBC 4.54 3.80 - 5.10 Million/uL   Hemoglobin 11.8 11.7 - 15.5 g/dL   HCT 22.0 25.4 - 27.0 %   MCV 79.5 (L) 80.0 - 100.0 fL   MCH 26.0 (L) 27.0 - 33.0 pg   MCHC 32.7 32.0 - 36.0 g/dL   RDW 62.3 76.2 - 83.1 %   Platelets 259 140 - 400 Thousand/uL   MPV 10.4 7.5 - 12.5 fL   Neutro Abs 4,864 1,500 - 7,800 cells/uL   Lymphs Abs 2,848 850 - 3,900 cells/uL   Absolute Monocytes 521 200 - 950 cells/uL   Eosinophils Absolute 126 15 - 500 cells/uL   Basophils Absolute 42 0 - 200 cells/uL   Neutrophils Relative % 57.9 %   Total Lymphocyte 33.9 %   Monocytes Relative 6.2 %   Eosinophils Relative 1.5 %   Basophils Relative 0.5 %      Assessment & Plan:   Problem List Items Addressed This Visit       Endocrine   Hyperlipidemia associated with type 2 diabetes mellitus (HCC)    Switch to rosuvastatin to help with cramps.       Relevant Medications   rosuvastatin (CRESTOR) 5 MG tablet     Other   Class 1 obesity with serious comorbidity and body mass index (BMI) of 33.0 to 33.9 in adult    Recommend weight loss through diet and exercise.      Hyperlipidemia   Relevant Medications   rosuvastatin (CRESTOR) 5 MG tablet   Prediabetes    Recheck a1c and adjust as indicated.      Relevant Orders   Lipid panel   Basic Metabolic Panel (BMET)   Hemoglobin A1c   Other Visit Diagnoses     Need for influenza  vaccination    -  Primary   Relevant Orders   Flu Vaccine QUAD High Dose(Fluad) (Completed)   Need for hepatitis C screening test       Relevant Orders   Hepatitis C antibody   Need for pneumococcal vaccination       Relevant Orders   Pneumococcal conjugate vaccine 20-valent (Prevnar 20) (Completed)        Follow up plan: Return in about 6 months (around 03/03/2022).   LABORATORY TESTING:  - Pap smear:  due, declines  IMMUNIZATIONS:   - Tdap: Tetanus vaccination status reviewed: last tetanus booster within 10 years. - Influenza: Administered today - Pneumococcal: Administered today - HPV: Not applicable - Shingrix vaccine: Not applicable, never had varicella - COVID vaccine: refused  SCREENING: - Mammogram:  due, ordered . Refused - Colonoscopy: Refused  - Bone Density: Refused  - Lung Cancer Screening: n/a  Hep C Screening: due  Osteoporosis: Discussed high  calcium and vitamin D supplementation, weight bearing exercises  Advanced Care Planning: A voluntary discussion about advance care planning including the explanation and discussion of advance directives.  Discussed health care proxy and Living will, and the patient was able to identify a health care proxy as daughter, Liam Graham.  Patient does not have a living will at present time. If patient does have living will, I have requested they bring this to the clinic to be scanned in to their chart.  PATIENT COUNSELING:   Advised to take 1 mg of folate supplement per day if capable of pregnancy.   Sexuality: Discussed sexually transmitted diseases, partner selection, use of condoms, avoidance of unintended pregnancy  and contraceptive alternatives.   Advised to avoid cigarette smoking.  I discussed with the patient that most people either abstain from alcohol or drink within safe limits (<=14/week and <=4 drinks/occasion for males, <=7/weeks and <= 3 drinks/occasion for females) and that the risk for alcohol disorders  and other health effects rises proportionally with the number of drinks per week and how often a drinker exceeds daily limits.  Discussed cessation/primary prevention of drug use and availability of treatment for abuse.   Diet: Encouraged to adjust caloric intake to maintain  or achieve ideal body weight, to reduce intake of dietary saturated fat and total fat, to limit sodium intake by avoiding high sodium foods and not adding table salt, and to maintain adequate dietary potassium and calcium preferably from fresh fruits, vegetables, and low-fat dairy products.    stressed the importance of regular exercise  Injury prevention: Discussed safety belts, safety helmets, smoke detector, smoking near bedding or upholstery.   Dental health: Discussed importance of regular tooth brushing, flossing, and dental visits.    NEXT PREVENTATIVE PHYSICAL DUE IN 1 YEAR. Return in about 6 months (around 03/03/2022).

## 2021-09-02 NOTE — Patient Instructions (Signed)
It was great to see you!  Our plans for today:  - Let us know if you change your mind about your mammogram and bone density scans.  - Stop taking the atorvastatin. Take the rosuvastatin instead.   We are checking some labs today, we will call you with these results.   Take care and seek immediate care sooner if you develop any concerns.   Dr. Linwood Dibbles

## 2021-09-02 NOTE — Assessment & Plan Note (Signed)
Recommend weight loss through diet and exercise. 

## 2021-09-02 NOTE — Assessment & Plan Note (Signed)
Switch to rosuvastatin to help with cramps.

## 2021-09-02 NOTE — Assessment & Plan Note (Signed)
Recheck a1c and adjust as indicated. 

## 2021-09-03 ENCOUNTER — Encounter: Payer: Self-pay | Admitting: General Surgery

## 2021-09-03 LAB — BASIC METABOLIC PANEL
BUN: 13 mg/dL (ref 7–25)
CO2: 27 mmol/L (ref 20–32)
Calcium: 9.6 mg/dL (ref 8.6–10.4)
Chloride: 104 mmol/L (ref 98–110)
Creat: 0.59 mg/dL (ref 0.50–1.05)
Glucose, Bld: 98 mg/dL (ref 65–139)
Potassium: 4 mmol/L (ref 3.5–5.3)
Sodium: 139 mmol/L (ref 135–146)

## 2021-09-03 LAB — LIPID PANEL
Cholesterol: 153 mg/dL (ref <200)
HDL: 56 mg/dL (ref >=50)
LDL Cholesterol (Calc): 83 mg/dL (calc)
Non-HDL Cholesterol (Calc): 97 mg/dL (calc) (ref <130)
Total CHOL/HDL Ratio: 2.7 (calc) (ref <5.0)
Triglycerides: 65 mg/dL (ref <150)

## 2021-09-03 LAB — HEMOGLOBIN A1C
Hgb A1c MFr Bld: 5.7 % of total Hgb — ABNORMAL HIGH (ref <5.7)
Mean Plasma Glucose: 117 mg/dL
eAG (mmol/L): 6.5 mmol/L

## 2021-09-03 LAB — HEPATITIS C ANTIBODY
Hepatitis C Ab: NONREACTIVE
SIGNAL TO CUT-OFF: 0.08 (ref <1.00)

## 2021-09-12 ENCOUNTER — Ambulatory Visit: Payer: 59 | Admitting: Family Medicine

## 2021-10-05 ENCOUNTER — Other Ambulatory Visit: Payer: Self-pay

## 2021-10-05 ENCOUNTER — Emergency Department
Admission: EM | Admit: 2021-10-05 | Discharge: 2021-10-05 | Disposition: A | Discharge status: Home / Self Care | Payer: 59 | Attending: Emergency Medicine | Admitting: Emergency Medicine

## 2021-10-05 DIAGNOSIS — E785 Hyperlipidemia, unspecified: Secondary | ICD-10-CM | POA: Diagnosis not present

## 2021-10-05 DIAGNOSIS — Z1152 Encounter for screening for COVID-19: Secondary | ICD-10-CM | POA: Diagnosis present

## 2021-10-05 DIAGNOSIS — E1169 Type 2 diabetes mellitus with other specified complication: Secondary | ICD-10-CM | POA: Insufficient documentation

## 2021-10-05 DIAGNOSIS — Z20822 Contact with and (suspected) exposure to covid-19: Secondary | ICD-10-CM | POA: Diagnosis not present

## 2021-10-05 LAB — RESP PANEL BY RT-PCR (FLU A&B, COVID) ARPGX2
Influenza A by PCR: NEGATIVE
Influenza B by PCR: NEGATIVE
SARS Coronavirus 2 by RT PCR: NEGATIVE

## 2021-10-05 NOTE — ED Provider Notes (Signed)
Emergency Medicine Provider Triage Evaluation Note  Kristine Alexander , a 65 y.o. female  was evaluated in triage.  Pt complains of request for COVID screening.  Patient is set to travel out of country in a few days, is requesting a test to present to the airport.  Review of Systems  Positive: N/A Negative: FCS  Physical Exam  BP 139/74   Pulse 64   Temp 97.9 F (36.6 C) (Oral)   Resp 20   Ht 5\' 2"  (1.575 m)   Wt 90.7 kg   SpO2 96%   BMI 36.58 kg/m  Gen:   Awake, no distress  NAD Resp:  Normal effort CTA MSK:   Moves extremities without difficulty  Other:  CVS: RRR  Medical Decision Making  Medically screening exam initiated at 6:49 PM.  Appropriate orders placed.  Terrian Wardrip was informed that the remainder of the evaluation will be completed by another provider, this initial triage assessment does not replace that evaluation, and the importance of remaining in the ED until their evaluation is complete.  Patient with a ED request for screening testing for COVID prior to travel.   , PA-C 10/05/21 2345    10/07/21, MD 10/07/21 [phone]

## 2021-10-05 NOTE — ED Triage Notes (Signed)
Pt to ED for covid test before flight on Monday. Wants to know if can come back in a few hours for print out of results because has a birthday party to attend. Denies sx

## 2021-10-05 NOTE — Discharge Instructions (Addendum)
You may follow your results on [facility] MyChart.  Use the activation code to set up your account.

## 2021-10-14 ENCOUNTER — Encounter: Payer: Self-pay | Admitting: Physician Assistant

## 2021-10-14 NOTE — ED Provider Notes (Signed)
[facility] Emergency Department Provider Note ____________________________________________  Time seen: 42  I have reviewed the triage vital signs and the nursing notes.  HISTORY  Chief Complaint  Labs Only (Covid test requested)   HPI Kristine Alexander is a 65 y.o. female presents to the ED for COVID testing.  Patient is planned airline travel next week, needs a medical clearance with a COVID test.  No complaints or reports of exposures at this time.  Past Medical History:  Diagnosis Date   Cholelithiases 03/22/2019   Diabetes mellitus without complication (HCC)    Hyperlipidemia    Iron deficiency anemia    Microcytosis 03/01/2017   chronic   Numbness 08/18/2017   Osteoarthritis of multiple joints    Prediabetes 08/20/2017    Patient Active Problem List   Diagnosis Date Noted   Mass of upper lobe of lung 02/29/2020   Thoracic aorta atherosclerosis (HCC) 08/17/2019   Hyperlipidemia associated with type 2 diabetes mellitus (HCC) 08/17/2019   Mediastinal lymphadenopathy 08/17/2019   Lung nodule, multiple 08/17/2019   Aortic systolic murmur on examination 08/17/2019   S/P cholecystectomy 08/17/2019   Hepatic steatosis 03/22/2019   Prediabetes 08/20/2017   Anemia 08/20/2017   Vitamin B12 deficiency 08/20/2017   Cardiac murmur 09/18/2015   Class 1 obesity with serious comorbidity and body mass index (BMI) of 33.0 to 33.9 in adult 05/16/2015   Osteoarthrosis, generalized, multiple joints 05/16/2015   Hyperlipidemia 05/16/2015    Past Surgical History:  Procedure Laterality Date   CATARACT EXTRACTION W/PHACO Right 02/25/2016   Procedure: CATARACT EXTRACTION PHACO AND INTRAOCULAR LENS PLACEMENT (IOC);  Surgeon: Sallee Lange, MD;  Location: [facility] ORS;  Service: Ophthalmology;  Laterality: Right;  [place]    00:48.2 AP%   23.5 CDE   22.58 fluid pack lot # [id number]   CATARACT EXTRACTION W/PHACO Left 06/08/2018   Procedure: CATARACT EXTRACTION PHACO AND  INTRAOCULAR LENS PLACEMENT (IOC);  Surgeon: Galen Manila, MD;  Location: [facility] ORS;  Service: Ophthalmology;  Laterality: Left;  [place] 00:28.4 AP% 13.0 CDE 3.68 Fluid pack Lot # [id number]   CHOLECYSTECTOMY N/A 05/19/2019   Procedure: LAPAROSCOPIC CHOLECYSTECTOMY;  Surgeon: Duanne Guess, MD;  Location: [facility] ORS;  Service: General;  Laterality: N/A;   TUBAL LIGATION      Prior to Admission medications   Medication Sig Start Date End Date Taking? Authorizing Provider  metFORMIN (GLUCOPHAGE-XR) 500 MG 24 hr tablet TAKE 2 TABLETS(1000 MG) BY MOUTH DAILY 03/13/21   Danelle Berry, PA-C  rosuvastatin (CRESTOR) 5 MG tablet Take 1 tablet (5 mg total) by mouth daily. 09/02/21   Caro Laroche, DO    Allergies Patient has no known allergies.  Family History  Problem Relation Age of Onset   Hyperlipidemia Mother    Hyperlipidemia Father    Hyperlipidemia Sister    Diabetes Brother    Hyperlipidemia Brother     Social History Social History   Tobacco Use   Smoking status: Never   Smokeless tobacco: Never  Vaping Use   Vaping Use: Never used  Substance Use Topics   Alcohol use: No    Alcohol/week: 0.0 standard drinks   Drug use: No    Review of Systems  Constitutional: Negative for fever. Eyes: Negative for visual changes. ENT: Negative for sore throat. Cardiovascular: Negative for chest pain. Respiratory: Negative for shortness of breath. Gastrointestinal: Negative for abdominal pain, vomiting and diarrhea. Genitourinary: Negative for dysuria. Musculoskeletal: Negative for back pain. Skin: Negative for rash. Neurological: Negative for headaches, focal  weakness or numbness. ____________________________________________  PHYSICAL EXAM:  VITAL SIGNS: ED Triage Vitals  Enc Vitals Group     BP 10/05/21 1813 139/74     Pulse Rate 10/05/21 1812 64     Resp 10/05/21 1812 20     Temp 10/05/21 1812 97.9 F (36.6 C)     Temp Source 10/05/21 1812 Oral     SpO2 10/05/21 1813  96 %     Weight 10/05/21 1812 200 lb (90.7 kg)     Height 10/05/21 1812 5\' 2"  (1.575 m)     Head Circumference --      Peak Flow --      Pain Score 10/05/21 1812 0     Pain Loc --      Pain Edu? --      Excl. in GC? --     Constitutional: Alert and oriented. Well appearing and in no distress. Head: Normocephalic and atraumatic. Eyes: Conjunctivae are normal. Normal extraocular movements Cardiovascular: Normal rate, regular rhythm. Normal distal pulses. Respiratory: Normal respiratory effort. No wheezes/rales/rhonchi. Musculoskeletal: Nontender with normal range of motion in all extremities.  Neurologic:  Normal gait without ataxia. Normal speech and language. No gross focal neurologic deficits are appreciated. Skin:  Skin is warm, dry and intact. No rash noted. Psychiatric: Mood and affect are normal. Patient exhibits appropriate insight and judgment. ____________________________________________    {LABS (pertinent positives/negatives) . Labs Reviewed  RESP PANEL BY RT-PCR (FLU A&B, COVID) [id number]  ___________________________________________  {EKG  ____________________________________________   RADIOLOGY Official radiology report(s): No results found. ____________________________________________  PROCEDURES  Procedures ____________________________________________   INITIAL IMPRESSION / ASSESSMENT AND PLAN / ED COURSE  As part of my medical decision making, I reviewed the following data within the electronic MEDICAL RECORD NUMBER Labs reviewed pending and Notes from prior ED visits   Patient with a ED request for clearance with a COVID test.  Patient has plan airline travel next week, needs a medical clearance test.  Patient is in no acute distress without any complaints of exposure or symptoms.  They will follow-up with test results on call MyChart.   Demeka Pinkus was evaluated in Emergency Department on 10/14/2021 for the symptoms described in the history of present  illness. She was evaluated in the context of the global COVID-19 pandemic, which necessitated consideration that the patient might be at risk for infection with the SARS-CoV-2 virus that causes COVID-19. Institutional protocols and algorithms that pertain to the evaluation of patients at risk for COVID-19 are in a state of rapid change based on information released by regulatory bodies including the CDC and federal and state organizations. These policies and algorithms were followed during the patient's care in the ED. ____________________________________________  FINAL CLINICAL IMPRESSION(S) / ED DIAGNOSES  Final diagnoses:  Encounter for screening laboratory testing for COVID-19 virus in asymptomatic patient      10/16/2021, PA-C 10/14/21 1904    10/16/21, MD 10/16/21 [id number]

## 2021-10-18 IMAGING — CT CT CHEST W/O CM
2 of 4 series · 15 of 36 positions shown, 18 images · non-contrast
Comparison: Chest CT 03/20/2020 and 05/31/2019.

CLINICAL DATA: Follow up pulmonary nodule.

EXAM:
CT CHEST WITHOUT CONTRAST
TECHNIQUE: Multidetector CT imaging of the chest was performed following the
standard protocol without IV contrast.

[Series 2: chest 2.00 · axial · 0.66mm/px · z∈[-1176,-928]mm · 12 of 148 slices shown, 15 images]
[im 12/148  mediastinal]
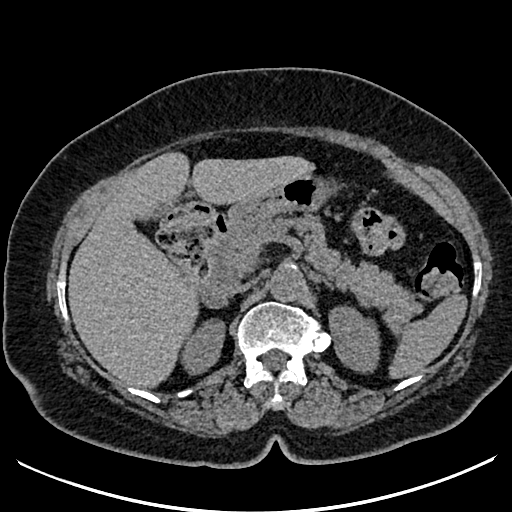
[im 12/148  lung]
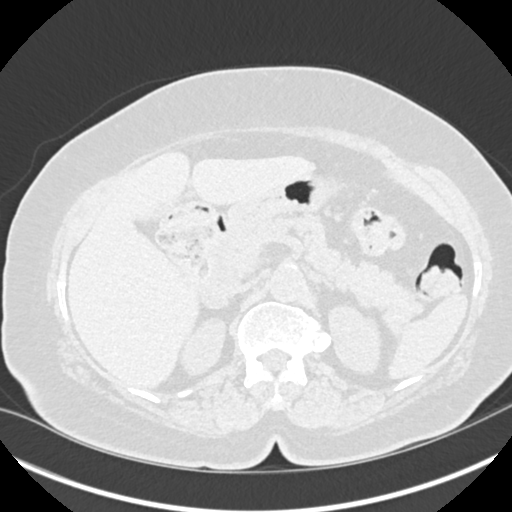
[im 23/148  lung]
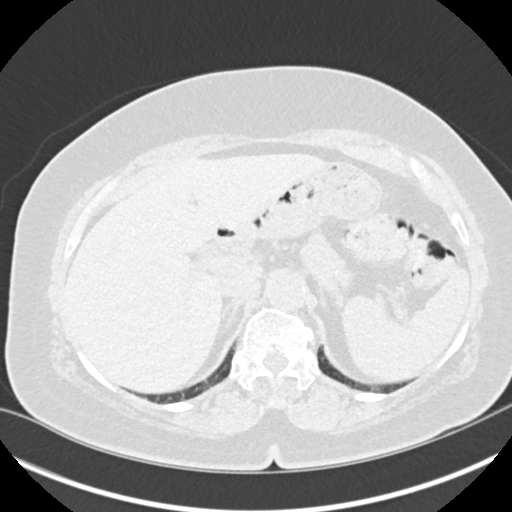
[im 34/148  lung]
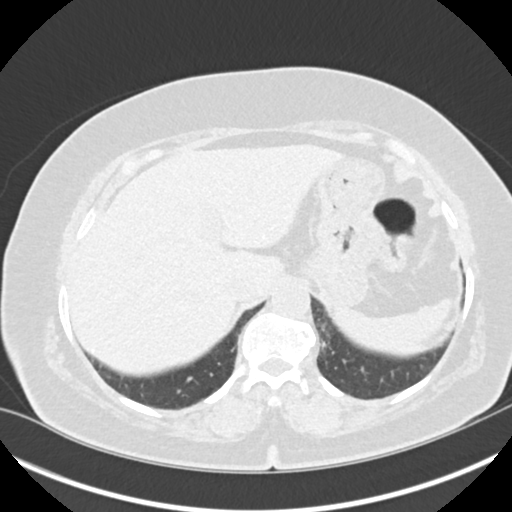
[im 46/148  lung]
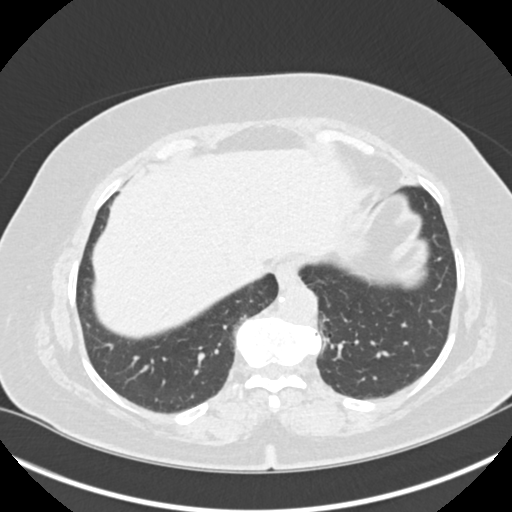
[im 57/148  mediastinal]
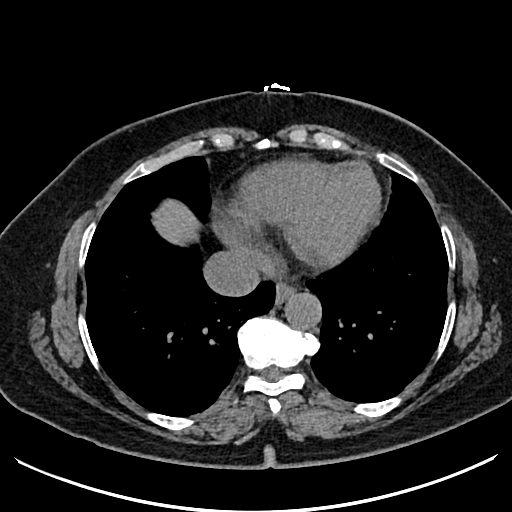
[im 57/148  lung]
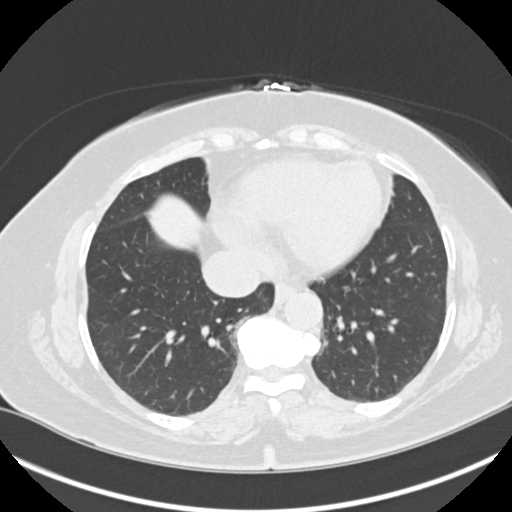
[im 68/148  lung]
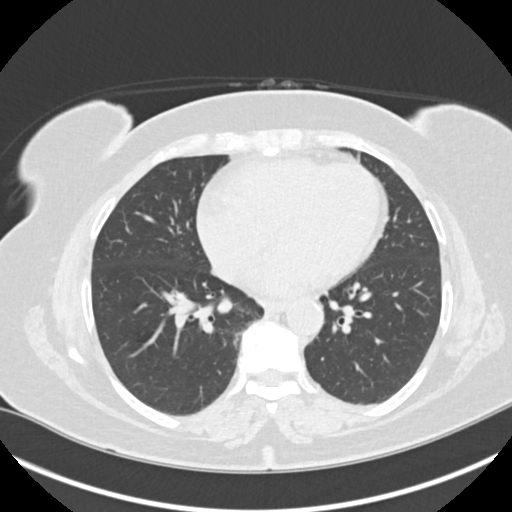
[im 80/148  lung]
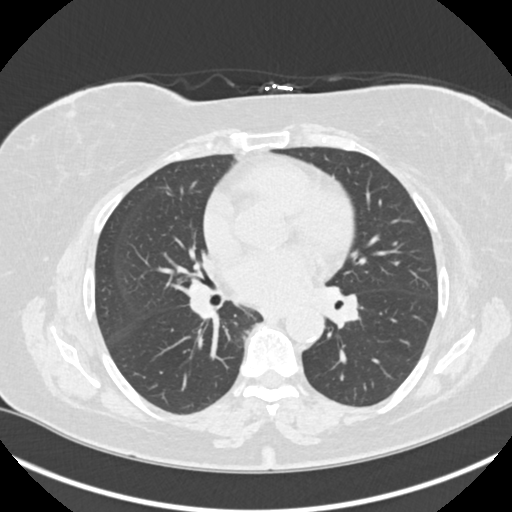
[im 91/148  lung]
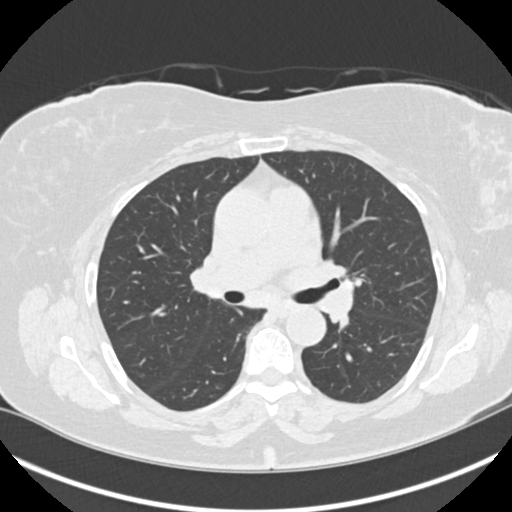
[im 102/148  mediastinal]
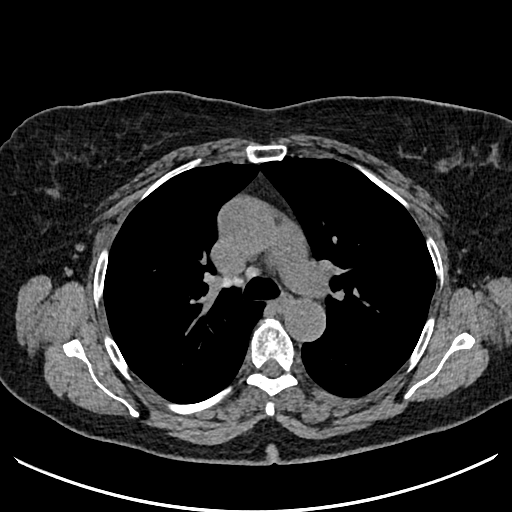
[im 102/148  lung]
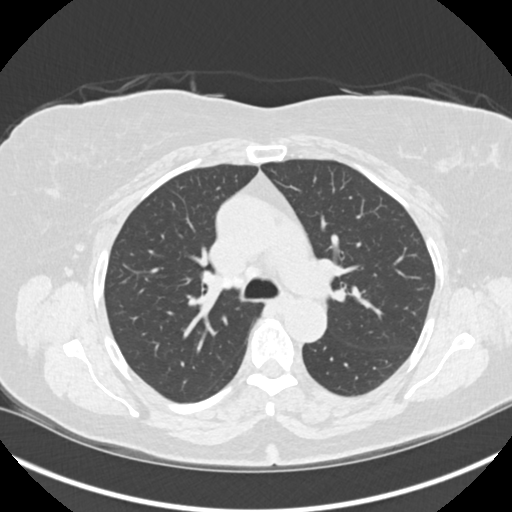
[im 114/148  lung]
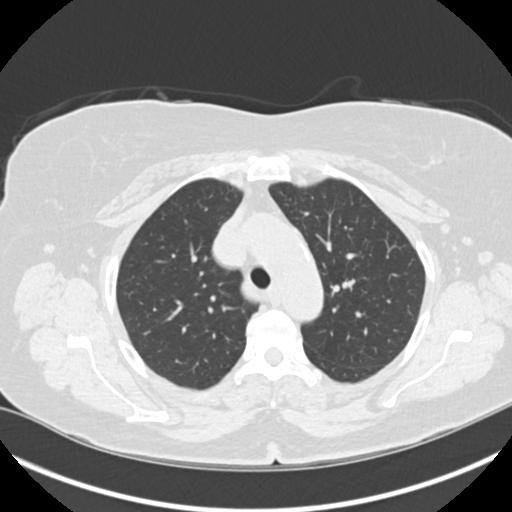
[im 125/148  lung]
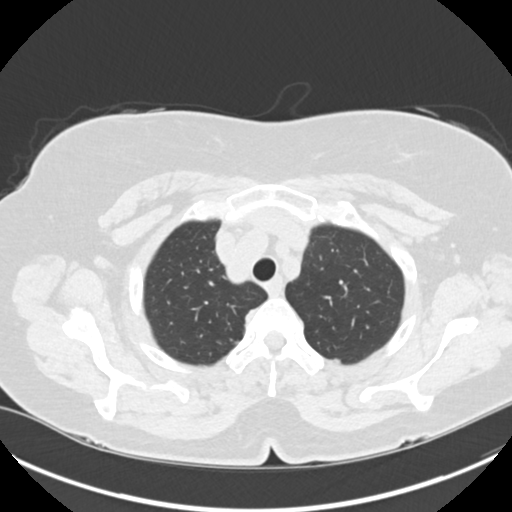
[im 136/148  lung]
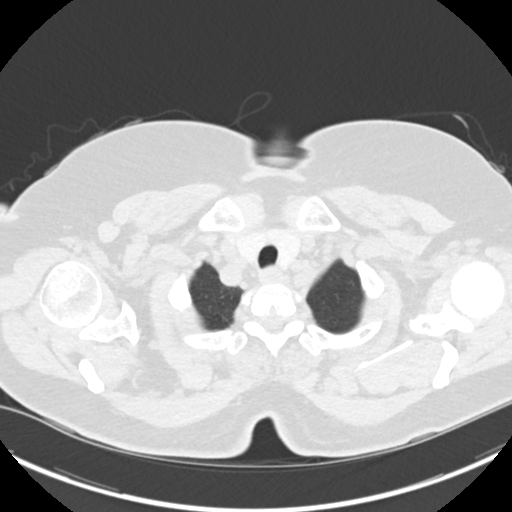

[Series 5: coronals chest 2.00 cor · coronal · 0.58mm/px · 3 of 142 slices shown]
[im 29/142  lung]
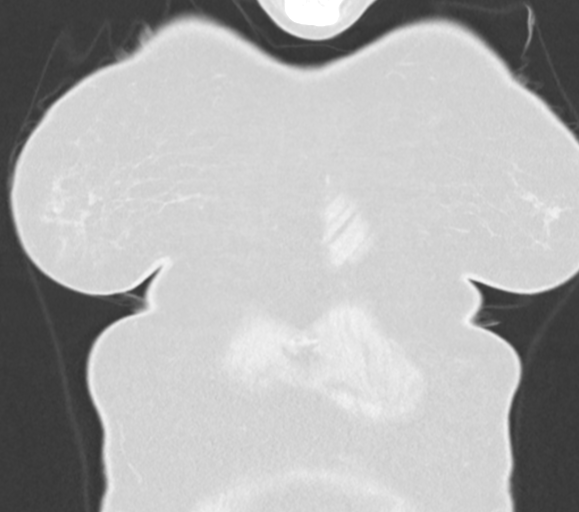
[im 57/142  lung]
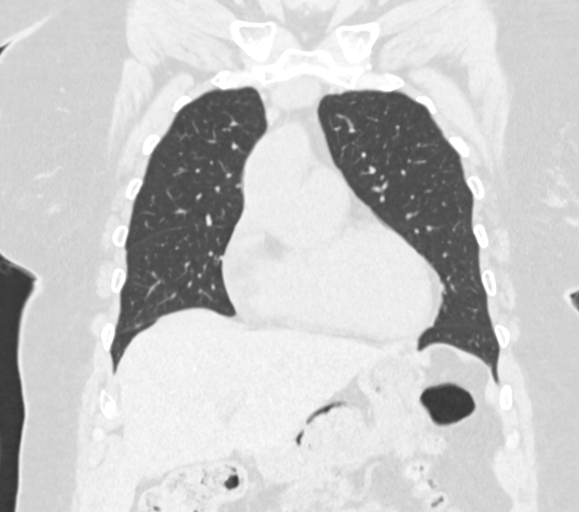
[im 85/142  lung]
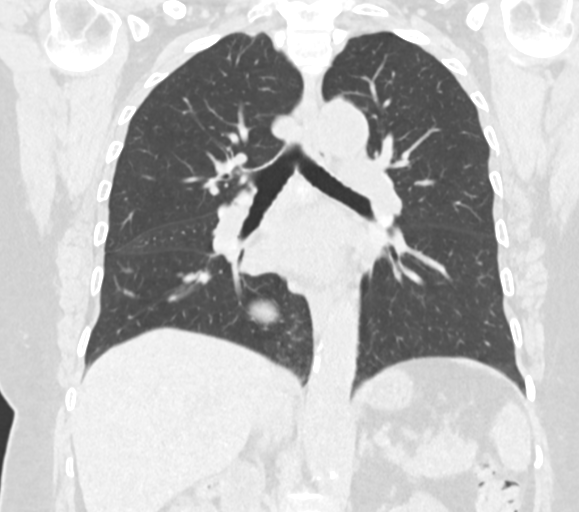

[15 of 36 positions shown; findings below may reference images not displayed]

FINDINGS: Cardiovascular: Mild aortic and coronary artery atherosclerosis. The
heart size is normal. There is no pericardial effusion.

Mediastinum/Nodes: Multiple small faintly calcified mediastinal and
hilar lymph nodes are again noted, not significantly changed. There
are small axillary lymph nodes bilaterally. No progressive
adenopathy identified. The thyroid gland, trachea and esophagus
demonstrate no significant findings.

Lungs/Pleura: No pleural effusion or pneumothorax. Stable
pleural-based and subpleural nodularity posteriorly in the right
upper lobe, largest measuring 8 mm on image [DATE]. Parenchymal nodule
seen on the same image is partially calcified. Subpleural nodule in
the right lower lobe adjacent to the esophagus on image 84/3 is
stable. There is a stable tiny left upper lobe nodule on image 55/3.
No new or enlarging nodules.

Upper abdomen: The visualized upper abdomen appears stable without
suspicious findings status post cholecystectomy. No adrenal mass.

Musculoskeletal/Chest wall: There is no chest wall mass or
suspicious osseous finding. Left-sided cervical rib articulating
with the 1st rib noted.
IMPRESSION: 1. Stable lung nodularity from 05/31/2019, consistent with a benign
etiology. Per consensus guidelines, no additional follow-up
necessary. This recommendation follows the consensus statement:
Guidelines for Management of Small Pulmonary Nodules Detected on CT
Images: From the [HOSPITAL] 9230; Radiology 9230;
[DATE].
2. Stable partially calcified mediastinal and hilar lymph nodes,
suggesting prior granulomatous disease. In combination, these
findings may represent sarcoidosis.
3. No suspicious pulmonary nodules or acute findings.
4. Mild coronary and Aortic Atherosclerosis ([id number]).

## 2022-01-20 ENCOUNTER — Other Ambulatory Visit: Payer: Self-pay | Admitting: Family Medicine

## 2022-02-24 ENCOUNTER — Other Ambulatory Visit: Payer: Self-pay

## 2022-02-24 DIAGNOSIS — Z13820 Encounter for screening for osteoporosis: Secondary | ICD-10-CM

## 2022-02-24 DIAGNOSIS — Z1231 Encounter for screening mammogram for malignant neoplasm of breast: Secondary | ICD-10-CM

## 2022-03-03 ENCOUNTER — Ambulatory Visit (INDEPENDENT_AMBULATORY_CARE_PROVIDER_SITE_OTHER): Payer: 59 | Admitting: Family Medicine

## 2022-03-03 ENCOUNTER — Encounter: Payer: Self-pay | Admitting: Family Medicine

## 2022-03-03 VITALS — BP 118/74 | HR 85 | Temp 97.7°F | Resp 16 | Ht 60.0 in | Wt 158.5 lb

## 2022-03-03 DIAGNOSIS — M79604 Pain in right leg: Secondary | ICD-10-CM

## 2022-03-03 DIAGNOSIS — E785 Hyperlipidemia, unspecified: Secondary | ICD-10-CM | POA: Diagnosis not present

## 2022-03-03 DIAGNOSIS — I700 Atherosclerosis of aorta: Secondary | ICD-10-CM

## 2022-03-03 DIAGNOSIS — E1169 Type 2 diabetes mellitus with other specified complication: Secondary | ICD-10-CM

## 2022-03-03 DIAGNOSIS — R7303 Prediabetes: Secondary | ICD-10-CM

## 2022-03-03 DIAGNOSIS — M79605 Pain in left leg: Secondary | ICD-10-CM

## 2022-03-03 MED ORDER — METFORMIN HCL ER 500 MG PO TB24: ORAL_TABLET | ORAL | 3 refills | Status: DC

## 2022-03-03 NOTE — Progress Notes (Signed)
? ?Name: Kristine Alexander   MRN: [id number]    DOB: [birth year]   Date:03/03/2022 ? ?     Progress Note ? ?Chief Complaint  ?Patient presents with  ? Follow-up  ? Hyperlipidemia  ? PreDM  ? ? ? ?Subjective:  ? ?Kristine Alexander is a 66 y.o. female, presents to clinic for routine f/up ? ? ?Predm on metformin 1000 mg once daily ?Denies: Polyuria, polydipsia, vision changes, neuropathy, hypoglycemia ?Recent pertinent labs: ?Lab Results  ?Component Value Date  ? HGBA1C 5.7 (H) 09/02/2021  ? HGBA1C 5.8 (H) 03/12/2021  ? HGBA1C 5.8 (H) 08/31/2020  ? ? ? ?Hyperlipidemia: ?Currently treated with crestor 5 mg, pt reports good med compliance - occasionally will hold for a few days when having leg pain - we have held for months in the past and she dontinued to have LE/calf pain intermittently w/o improvement ?Last Lipids: ?Lab Results  ?Component Value Date  ? CHOL 153 09/02/2021  ? HDL 56 09/02/2021  ? LDLCALC 83 09/02/2021  ? TRIG 65 09/02/2021  ? CHOLHDL 2.7 09/02/2021  ? ?- Denies: Chest pain, shortness of breath, myalgias, claudication ? ? ?Leg pain complaints unchanged, no swelling, redness ?No joint pain ?Daughter also mentions when leaving room numbness to fingers and toes, no pallor, no weakness, no swelling ? ?Recent UTI tx with bactrim - much better, no current sx ? ? ? ? ? ? ?Current Outpatient Medications:  ?  metFORMIN (GLUCOPHAGE-XR) 500 MG 24 hr tablet, TAKE 2 TABLETS(1000 MG) BY MOUTH DAILY, Disp: 180 tablet, Rfl: 3 ?  rosuvastatin (CRESTOR) 5 MG tablet, Take 1 tablet (5 mg total) by mouth daily., Disp: 90 tablet, Rfl: 3 ?  sulfamethoxazole-trimethoprim (BACTRIM DS) 800-160 MG tablet, Take 1 tablet by mouth 2 (two) times daily., Disp: , Rfl:  ? ?Patient Active Problem List  ? Diagnosis Date Noted  ? Mass of upper lobe of lung 02/29/2020  ? Thoracic aorta atherosclerosis (HCC) 08/17/2019  ? Hyperlipidemia associated with type 2 diabetes mellitus (HCC) 08/17/2019  ? Mediastinal lymphadenopathy 08/17/2019  ? Lung  nodule, multiple 08/17/2019  ? Aortic systolic murmur on examination 08/17/2019  ? S/P cholecystectomy 08/17/2019  ? Hepatic steatosis 03/22/2019  ? Prediabetes 08/20/2017  ? Anemia 08/20/2017  ? Vitamin B12 deficiency 08/20/2017  ? Cardiac murmur 09/18/2015  ? Class 1 obesity with serious comorbidity and body mass index (BMI) of 33.0 to 33.9 in adult 05/16/2015  ? Osteoarthrosis, generalized, multiple joints 05/16/2015  ? Hyperlipidemia 05/16/2015  ? ? ?Past Surgical History:  ?Procedure Laterality Date  ? CATARACT EXTRACTION W/PHACO Right 02/25/2016  ? Procedure: CATARACT EXTRACTION PHACO AND INTRAOCULAR LENS PLACEMENT (IOC);  Surgeon: Sallee Lange, MD;  Location: [facility] ORS;  Service: Ophthalmology;  Laterality: Right;  [place]    00:48.2 ?AP%   23.5 ?CDE   22.58 ?fluid pack lot # [id number]  ? CATARACT EXTRACTION W/PHACO Left 06/08/2018  ? Procedure: CATARACT EXTRACTION PHACO AND INTRAOCULAR LENS PLACEMENT (IOC);  Surgeon: Galen Manila, MD;  Location: [facility] ORS;  Service: Ophthalmology;  Laterality: Left;  [place] 00:28.4 ?AP% 13.0 ?CDE 3.68 ?Fluid pack Lot # [id number]  ? CHOLECYSTECTOMY N/A 05/19/2019  ? Procedure: LAPAROSCOPIC CHOLECYSTECTOMY;  Surgeon: Duanne Guess, MD;  Location: [facility] ORS;  Service: General;  Laterality: N/A;  ? TUBAL LIGATION    ? ? ?Family History  ?Problem Relation Age of Onset  ? Hyperlipidemia Mother   ? Hyperlipidemia Father   ? Hyperlipidemia Sister   ? Diabetes Brother   ? Hyperlipidemia Brother   ? ? ?  Social History  ? ?Tobacco Use  ? Smoking status: Never  ? Smokeless tobacco: Never  ?Vaping Use  ? Vaping Use: Never used  ?Substance Use Topics  ? Alcohol use: No  ?  Alcohol/week: 0.0 standard drinks  ? Drug use: No  ?  ? ?No Known Allergies ? ?Health Maintenance  ?Topic Date Due  ? MAMMOGRAM  Never done  ? DEXA SCAN  Never done  ? HEMOGLOBIN A1C  03/03/2022  ? URINE MICROALBUMIN  03/12/2022  ? Zoster Vaccines- Shingrix (1 of 2) 06/02/2022 (Originally 12/01/2005)  ? COLONOSCOPY (Pts  45-59yrs Insurance coverage will need to be confirmed)  09/02/2022 (Originally 12/01/2000)  ? FOOT EXAM  03/12/2022  ? OPHTHALMOLOGY EXAM  05/17/2022  ? INFLUENZA VACCINE  06/17/2022  ? TETANUS/TDAP  01/19/2029  ? Pneumonia Vaccine 66+ Years old  Completed  ? Hepatitis C Screening  Completed  ? HPV VACCINES  Aged Out  ? COVID-19 Vaccine  Discontinued  ? ? ?Chart Review Today: ?I personally reviewed active problem list, medication list, allergies, family history, social history, health maintenance, notes from last encounter, lab results, imaging with the patient/caregiver today. ? ? ?Review of Systems  ?Constitutional: Negative.   ?HENT: Negative.    ?Eyes: Negative.   ?Respiratory: Negative.    ?Cardiovascular: Negative.   ?Gastrointestinal: Negative.   ?Endocrine: Negative.   ?Genitourinary: Negative.   ?Musculoskeletal: Negative.   ?Skin: Negative.   ?Allergic/Immunologic: Negative.   ?Neurological: Negative.   ?Hematological: Negative.   ?Psychiatric/Behavioral: Negative.    ?All other systems reviewed and are negative.  ? ?Objective:  ? ?Vitals:  ? 03/03/22 1053  ?BP: 118/74  ?Pulse: 85  ?Resp: 16  ?Temp: 97.7 ?F (36.5 ?C)  ?TempSrc: Oral  ?SpO2: 94%  ?Weight: 158 lb 8 oz (71.9 kg)  ?Height: 5' (1.524 m)  ? ? ?Body mass index is 30.95 kg/m?. ? ?Physical Exam ?Vitals and nursing note reviewed.  ?Constitutional:   ?   General: She is not in acute distress. ?   Appearance: Normal appearance. She is not ill-appearing, toxic-appearing or diaphoretic.  ?HENT:  ?   Head: Normocephalic and atraumatic.  ?   Right Ear: External ear normal.  ?   Left Ear: External ear normal.  ?Eyes:  ?   General: No scleral icterus.    ?   Right eye: No discharge.     ?   Left eye: No discharge.  ?   Conjunctiva/sclera: Conjunctivae normal.  ?Cardiovascular:  ?   Rate and Rhythm: Normal rate and regular rhythm.  ?   Pulses: Normal pulses.  ?   Heart sounds: Normal heart sounds.  ?Pulmonary:  ?   Effort: Pulmonary effort is normal. No  respiratory distress.  ?   Breath sounds: Normal breath sounds. No stridor. No wheezing, rhonchi or rales.  ?Abdominal:  ?   General: Bowel sounds are normal.  ?   Palpations: Abdomen is soft.  ?Musculoskeletal:     ?   General: No swelling or tenderness. Normal range of motion.  ?   Right lower leg: No edema.  ?   Left lower leg: No edema.  ?Skin: ?   General: Skin is warm and dry.  ?   Coloration: Skin is not jaundiced or pale.  ?   Findings: No lesion or rash.  ?Neurological:  ?   Mental Status: She is alert.  ?   Gait: Gait normal.  ?Psychiatric:     ?   Mood and Affect:  Mood normal.     ?   Behavior: Behavior normal.  ?  ? ? ? ? ?Assessment & Plan:  ? ?1. Prediabetes ?A1C's have been stable, tolerating metformin ?No progression of disease or sx concerning for hyperglycemia ?Recheck A1C - may also monitor this annually  ?- Hemoglobin A1c ?- metFORMIN (GLUCOPHAGE-XR) 500 MG 24 hr tablet; TAKE 2 TABLETS(1000 MG) BY MOUTH DAILY  Dispense: 180 tablet; Refill: 3 ?- COMPLETE METABOLIC PANEL WITH GFR ? ?2. Hyperlipidemia, unspecified hyperlipidemia type ?Last lipids were at goal, tolerating crestor 5 mg almost daily - she will occasionally hold meds for leg pain - which has previously been worked up (neg B12, iron panel, CK and no change with holding statin for several months.) ?- COMPLETE METABOLIC PANEL WITH GFR ? ?3. Thoracic aorta atherosclerosis (HCC) ?On statin, monitoring ?Reviewed last CT scan - atherosclerotic burden stable ?- COMPLETE METABOLIC PANEL WITH GFR ? ?4. Leg pain, bilateral ?Previously worked up - (neg B12, iron panel, CK and no change with holding statin for several months ? ? ? ?No follow-ups on file.  ? ?Danelle BerryLeisa Taimi Towe, PA-C ?03/03/22 11:06 AM ? ? ?

## 2022-03-04 LAB — COMPLETE METABOLIC PANEL WITH GFR
AG Ratio: 1.2 (calc) (ref 1.0–2.5)
ALT: 15 U/L (ref 6–29)
AST: 20 U/L (ref 10–35)
Albumin: 4.2 g/dL (ref 3.6–5.1)
Alkaline phosphatase (APISO): 58 U/L (ref 37–153)
BUN: 11 mg/dL (ref 7–25)
CO2: 27 mmol/L (ref 20–32)
Calcium: 9.4 mg/dL (ref 8.6–10.4)
Chloride: 104 mmol/L (ref 98–110)
Creat: 0.81 mg/dL (ref 0.50–1.05)
Globulin: 3.6 g/dL (calc) (ref 1.9–3.7)
Glucose, Bld: 86 mg/dL (ref 65–99)
Potassium: 4.5 mmol/L (ref 3.5–5.3)
Sodium: 137 mmol/L (ref 135–146)
Total Bilirubin: 0.2 mg/dL (ref 0.2–1.2)
Total Protein: 7.8 g/dL (ref 6.1–8.1)
eGFR: 80 mL/min/{1.73_m2} (ref >=60)

## 2022-03-04 LAB — HEMOGLOBIN A1C
Hgb A1c MFr Bld: 5.7 % of total Hgb — ABNORMAL HIGH (ref <5.7)
Mean Plasma Glucose: 117 mg/dL
eAG (mmol/L): 6.5 mmol/L

## 2022-07-17 ENCOUNTER — Other Ambulatory Visit: Payer: Self-pay | Admitting: Internal Medicine

## 2022-07-17 DIAGNOSIS — R7303 Prediabetes: Secondary | ICD-10-CM

## 2022-07-17 NOTE — Telephone Encounter (Signed)
Pt called in to request a refill for 2 medications. Updated pt's pharmacy in chart.   rosuvastatin (CRESTOR) 5 MG tablet  metFORMIN (GLUCOPHAGE-XR) 500 MG 24 hr tablet   Future appt: 09/02/22  Pharmacy: [id number] [place], [place] - [address]

## 2022-07-18 MED ORDER — ROSUVASTATIN CALCIUM 5 MG PO TABS: 5.0000 mg | ORAL_TABLET | Freq: Every day | ORAL | 0 refills | Status: DC

## 2022-07-18 MED ORDER — METFORMIN HCL ER 500 MG PO TB24: ORAL_TABLET | ORAL | 3 refills | Status: DC

## 2022-07-18 NOTE — Telephone Encounter (Signed)
Requested medication (s) are due for refill today:no  Requested medication (s) are on the active medication list: yes  /Last refill:  03/03/22  Future visit scheduled: yes  Notes to clinic:  Unable to refill per protocol, request is too soon, last refill was 03/03/22 for 180 and 3 refills. Routing for review.     Requested Prescriptions  Pending Prescriptions Disp Refills   metFORMIN (GLUCOPHAGE-XR) 500 MG 24 hr tablet 180 tablet 3    Sig: TAKE 2 TABLETS(1000 MG) BY MOUTH DAILY     Endocrinology:  Diabetes - Biguanides Failed - 07/17/2022  2:15 PM      Failed - B12 Level in normal range and within 720 days    Vitamin B-12  Date Value Ref Range Status  08/18/2017 399 200 - 1,100 pg/mL Final    Comment:    . Please Note: Although the reference range for vitamin B12 is [phone] pg/mL, it has been reported that between 5 and 10% of patients with values between 200 and 400 pg/mL may experience neuropsychiatric and hematologic abnormalities due to occult B12 deficiency; less than 1% of patients with values above 400 pg/mL will have symptoms. .          Failed - CBC within normal limits and completed in the last 12 months    WBC  Date Value Ref Range Status  03/12/2021 8.4 3.8 - 10.8 Thousand/uL Final   RBC  Date Value Ref Range Status  03/12/2021 4.54 3.80 - 5.10 Million/uL Final   Hemoglobin  Date Value Ref Range Status  03/12/2021 11.8 11.7 - 15.5 g/dL Final  12/26/2015 11.9 11.1 - 15.9 g/dL Final   HCT  Date Value Ref Range Status  03/12/2021 36.1 35.0 - 45.0 % Final   Hematocrit  Date Value Ref Range Status  12/26/2015 34.9 34.0 - 46.6 % Final   MCHC  Date Value Ref Range Status  03/12/2021 32.7 32.0 - 36.0 g/dL Final   [facility]  Date Value Ref Range Status  03/12/2021 26.0 (L) 27.0 - 33.0 pg Final   MCV  Date Value Ref Range Status  03/12/2021 79.5 (L) 80.0 - 100.0 fL Final  12/26/2015 77 (L) 79 - 97 fL Final   No results found for: "PLTCOUNTKUC",  "LABPLAT", "POCPLA" RDW  Date Value Ref Range Status  03/12/2021 14.5 11.0 - 15.0 % Final  12/26/2015 14.7 12.3 - 15.4 % Final         Passed - Cr in normal range and within 360 days    Creat  Date Value Ref Range Status  03/03/2022 0.81 0.50 - 1.05 mg/dL Final         Passed - HBA1C is between 0 and 7.9 and within 180 days    Hgb A1c MFr Bld  Date Value Ref Range Status  03/03/2022 5.7 (H) <5.7 % of total Hgb Final    Comment:    For someone without known diabetes, a hemoglobin  A1c value between 5.7% and 6.4% is consistent with prediabetes and should be confirmed with a  follow-up test. . For someone with known diabetes, a value <7% indicates that their diabetes is well controlled. A1c targets should be individualized based on duration of diabetes, age, comorbid conditions, and other considerations. . This assay result is consistent with an increased risk of diabetes. . Currently, no consensus exists regarding use of hemoglobin A1c for diagnosis of diabetes for children. .          Passed - eGFR in  normal range and within 360 days    GFR, Est African American  Date Value Ref Range Status  03/12/2021 108 > OR = 60 mL/min/1.31m Final   GFR, Est Non African American  Date Value Ref Range Status  03/12/2021 93 > OR = 60 mL/min/1.783mFinal   eGFR  Date Value Ref Range Status  03/03/2022 80 > OR = 60 mL/min/1.7376minal    Comment:    The eGFR is based on the CKD-EPI 2021 equation. To calculate  the new eGFR from a previous Creatinine or Cystatin C result, go to https://www.kidney.org/professionals/ kdoqi/gfr%5Fcalculator          Passed - Valid encounter within last 6 months    Recent Outpatient Visits           4 months ago [facility]   10 months ago Need for influenza vaccination   [facility]   1 year ago Hyperlipidemia associated with type 2 diabetes  [facility]   1 year ago Left ear pain   [facility]   1 year ago Otalgia of both ears   [facility]       Future Appointments             [facility] 1 month [facility]            Signed Prescriptions Disp Refills   rosuvastatin (CRESTOR) 5 MG tablet 90 tablet 0    Sig: Take 1 tablet (5 mg total) by mouth daily.     Cardiovascular:  Antilipid - Statins 2 Failed - 07/17/2022  2:15 PM      Failed - Lipid Panel in normal range within the last 12 months    Cholesterol, Total  Date Value Ref Range Status  09/19/2015 178 100 - 199 mg/dL Final   Cholesterol  Date Value Ref Range Status  09/02/2021 153 <200 mg/dL Final   LDL Cholesterol (Calc)  Date Value Ref Range Status  09/02/2021 83 mg/dL (calc) Final    Comment:    Reference range: <100 . Desirable range <100 mg/dL for primary prevention;   <70 mg/dL for patients with CHD or diabetic patients  with > or = 2 CHD risk factors. . LMarland KitchenL-C is now calculated using the Martin-Hopkins  calculation, which is a validated novel method providing  better accuracy than the Friedewald equation in the  estimation of LDL-C.  MarCresenciano Genre al. JAMAnnamaria [id number];[id number]  (http://education.[facility].com/faq/FAQ164)    HDL  Date Value Ref Range Status  09/02/2021 56 > OR = 50 mg/dL Final  09/19/2015 47 >39 mg/dL Final    Comment:    According to ATP-III Guidelines, HDL-C >59 mg/dL is considered a negative risk factor for CHD.    Triglycerides  Date Value Ref Range Status  09/02/2021 65 <150 mg/dL Final         Passed - Cr in normal range and within 360 days    Creat  Date Value Ref Range Status  03/03/2022 0.81 0.50 - 1.05 mg/dL Final         Passed - Patient is not pregnant      Passed - Valid encounter within last 12 months     Recent Outpatient Visits  4 months ago Prediabetes   [facility] Delsa Grana, PA-C   10 months ago Need for influenza vaccination   [facility] Myles Gip, DO   1 year ago Hyperlipidemia associated with type 2 diabetes mellitus [facility])   [facility] Delsa Grana, PA-C   1 year ago Left ear pain   [facility] Delsa Grana, PA-C   1 year ago Otalgia of both [facility] Towanda Malkin, MD       Future Appointments             In 1 month Teodora Medici, [facility], [facility]

## 2022-07-18 NOTE — Telephone Encounter (Signed)
Requested Prescriptions  Pending Prescriptions Disp Refills  . metFORMIN (GLUCOPHAGE-XR) 500 MG 24 hr tablet 180 tablet 3    Sig: TAKE 2 TABLETS(1000 MG) BY MOUTH DAILY     Endocrinology:  Diabetes - Biguanides Failed - 07/17/2022  2:15 PM      Failed - B12 Level in normal range and within 720 days    Vitamin B-12  Date Value Ref Range Status  08/18/2017 399 200 - 1,100 pg/mL Final    Comment:    . Please Note: Although the reference range for vitamin B12 is [phone] pg/mL, it has been reported that between 5 and 10% of patients with values between 200 and 400 pg/mL may experience neuropsychiatric and hematologic abnormalities due to occult B12 deficiency; less than 1% of patients with values above 400 pg/mL will have symptoms. .          Failed - CBC within normal limits and completed in the last 12 months    WBC  Date Value Ref Range Status  03/12/2021 8.4 3.8 - 10.8 Thousand/uL Final   RBC  Date Value Ref Range Status  03/12/2021 4.54 3.80 - 5.10 Million/uL Final   Hemoglobin  Date Value Ref Range Status  03/12/2021 11.8 11.7 - 15.5 g/dL Final  12/26/2015 11.9 11.1 - 15.9 g/dL Final   HCT  Date Value Ref Range Status  03/12/2021 36.1 35.0 - 45.0 % Final   Hematocrit  Date Value Ref Range Status  12/26/2015 34.9 34.0 - 46.6 % Final   MCHC  Date Value Ref Range Status  03/12/2021 32.7 32.0 - 36.0 g/dL Final   [facility]  Date Value Ref Range Status  03/12/2021 26.0 (L) 27.0 - 33.0 pg Final   MCV  Date Value Ref Range Status  03/12/2021 79.5 (L) 80.0 - 100.0 fL Final  12/26/2015 77 (L) 79 - 97 fL Final   No results found for: "PLTCOUNTKUC", "LABPLAT", "POCPLA" RDW  Date Value Ref Range Status  03/12/2021 14.5 11.0 - 15.0 % Final  12/26/2015 14.7 12.3 - 15.4 % Final         Passed - Cr in normal range and within 360 days    Creat  Date Value Ref Range Status  03/03/2022 0.81 0.50 - 1.05 mg/dL Final         Passed - HBA1C is between 0 and 7.9 and within  180 days    Hgb A1c MFr Bld  Date Value Ref Range Status  03/03/2022 5.7 (H) <5.7 % of total Hgb Final    Comment:    For someone without known diabetes, a hemoglobin  A1c value between 5.7% and 6.4% is consistent with prediabetes and should be confirmed with a  follow-up test. . For someone with known diabetes, a value <7% indicates that their diabetes is well controlled. A1c targets should be individualized based on duration of diabetes, age, comorbid conditions, and other considerations. . This assay result is consistent with an increased risk of diabetes. . Currently, no consensus exists regarding use of hemoglobin A1c for diagnosis of diabetes for children. .          Passed - eGFR in normal range and within 360 days    GFR, Est African American  Date Value Ref Range Status  03/12/2021 108 > OR = 60 mL/min/1.37m2 Final   GFR, Est Non African American  Date Value Ref Range Status  03/12/2021 93 > OR = 60 mL/min/1.16m2 Final   eGFR  Date Value Ref Range Status  03/03/2022 80 > OR = 60 mL/min/1.22m2 Final    Comment:    The eGFR is based on the CKD-EPI 2021 equation. To calculate  the new eGFR from a previous Creatinine or Cystatin C result, go to https://www.kidney.org/professionals/ kdoqi/gfr%5Fcalculator          Passed - Valid encounter within last 6 months    Recent Outpatient Visits          4 months ago Prediabetes   [facility] Delsa Grana, PA-C   10 months ago Need for influenza vaccination   [facility] Rory Percy M, DO   1 year ago Hyperlipidemia associated with type 2 diabetes mellitus [facility])   [facility] Delsa Grana, PA-C   1 year ago Left ear pain   [facility] Delsa Grana, PA-C   1 year ago Otalgia of both [place], MD      Future Appointments            In 1 month Teodora Medici, [facility], [place]           . rosuvastatin (CRESTOR) 5 MG tablet 90 tablet 0    Sig: Take 1 tablet (5 mg total) by mouth daily.     Cardiovascular:  Antilipid - Statins 2 Failed - 07/17/2022  2:15 PM      Failed - Lipid Panel in normal range within the last 12 months    Cholesterol, Total  Date Value Ref Range Status  09/19/2015 178 100 - 199 mg/dL Final   Cholesterol  Date Value Ref Range Status  09/02/2021 153 <200 mg/dL Final   LDL Cholesterol (Calc)  Date Value Ref Range Status  09/02/2021 83 mg/dL (calc) Final    Comment:    Reference range: <100 . Desirable range <100 mg/dL for primary prevention;   <70 mg/dL for patients with CHD or diabetic patients  with > or = 2 CHD risk factors. Marland Kitchen LDL-C is now calculated using the Martin-Hopkins  calculation, which is a validated novel method providing  better accuracy than the Friedewald equation in the  estimation of LDL-C.  Cresenciano Genre et al. Annamaria Helling. 8315;176(16): 2061-2068  (http://education.[facility].com/faq/FAQ164)    HDL  Date Value Ref Range Status  09/02/2021 56 > OR = 50 mg/dL Final  09/19/2015 47 >39 mg/dL Final    Comment:    According to ATP-III Guidelines, HDL-C >59 mg/dL is considered a negative risk factor for CHD.    Triglycerides  Date Value Ref Range Status  09/02/2021 65 <150 mg/dL Final         Passed - Cr in normal range and within 360 days    Creat  Date Value Ref Range Status  03/03/2022 0.81 0.50 - 1.05 mg/dL Final         Passed - Patient is not pregnant      Passed - Valid encounter within last 12 months    Recent Outpatient Visits          4 months ago Prediabetes   [facility] Delsa Grana, PA-C   10 months ago Need for influenza vaccination   [facility] Rory Percy M, DO   1 year ago Hyperlipidemia associated with type 2 diabetes mellitus [facility])   [facility] Delsa Grana, PA-C   1 year ago Left  ear pain   [facility] Delsa Grana,  PA-C   1 year ago Otalgia of both [facility] Towanda Malkin, MD      Future Appointments            In 1 month Teodora Medici, [facility], [facility]

## 2022-08-19 ENCOUNTER — Ambulatory Visit (INDEPENDENT_AMBULATORY_CARE_PROVIDER_SITE_OTHER): Payer: 59 | Admitting: Family Medicine

## 2022-08-19 ENCOUNTER — Ambulatory Visit: Payer: Self-pay

## 2022-08-19 ENCOUNTER — Encounter: Payer: Self-pay | Admitting: Family Medicine

## 2022-08-19 VITALS — BP 130/72 | HR 79 | Temp 97.9°F | Resp 16 | Ht 60.0 in | Wt 158.0 lb

## 2022-08-19 DIAGNOSIS — R42 Dizziness and giddiness: Secondary | ICD-10-CM | POA: Diagnosis not present

## 2022-08-19 MED ORDER — MECLIZINE HCL 25 MG PO TABS: 12.5000 mg | ORAL_TABLET | Freq: Three times a day (TID) | ORAL | 0 refills | Status: DC | PRN

## 2022-08-19 NOTE — Progress Notes (Signed)
Name: Kristine Alexander   MRN: [id number]    DOB: [birth year]   Date:08/19/2022       Progress Note  Chief Complaint  Patient presents with   Dizziness    Ongoing since Sunday especially looking down she gets even more dizzy. Did orthostatic on pt she stated several times she felt dizzy     Subjective:   Kristine Alexander is a 66 y.o. female, presents to clinic for dizziness onset 2 days ago and occurs with head movements Kristine Alexander daughter is here and give history and helps asks questions of pt  Pt noted spinning dizzy episodes that are brief lasting a few seconds and occurs with position changes and with head movements.  She feels dizzy, a loss of balance and is afraid to fall for a few seconds. She denies blacking out, vision changes, HA, confusion, focal weakness, numbness, facial droop No recent congestion No falls or head injury  Further denies CP SOB diaphoresis, palpitations exertional sx  Orthostatic VS for the past 24 hrs (Last 3 readings):  BP- Lying Pulse- Lying BP- Sitting Pulse- Sitting BP- Standing at 0 minutes Pulse- Standing at 0 minutes  08/19/22 1152 134/76 76 132/72 78 130/72 79       Current Outpatient Medications:    metFORMIN (GLUCOPHAGE-XR) 500 MG 24 hr tablet, TAKE 2 TABLETS(1000 MG) BY MOUTH DAILY, Disp: 180 tablet, Rfl: 3   rosuvastatin (CRESTOR) 5 MG tablet, Take 1 tablet (5 mg total) by mouth daily., Disp: 90 tablet, Rfl: 0  Patient Active Problem List   Diagnosis Date Noted   Mass of upper lobe of lung 02/29/2020   Thoracic aorta atherosclerosis (HCC) 08/17/2019   Hyperlipidemia associated with type 2 diabetes mellitus (HCC) 08/17/2019   Mediastinal lymphadenopathy 08/17/2019   Lung nodule, multiple 08/17/2019   Aortic systolic murmur on examination 08/17/2019   S/P cholecystectomy 08/17/2019   Hepatic steatosis 03/22/2019   Prediabetes 08/20/2017   Anemia 08/20/2017   Vitamin B12 deficiency 08/20/2017   Cardiac murmur 09/18/2015   Class 1  obesity with serious comorbidity and body mass index (BMI) of 33.0 to 33.9 in adult 05/16/2015   Osteoarthrosis, generalized, multiple joints 05/16/2015   Hyperlipidemia 05/16/2015    Past Surgical History:  Procedure Laterality Date   CATARACT EXTRACTION W/PHACO Right 02/25/2016   Procedure: CATARACT EXTRACTION PHACO AND INTRAOCULAR LENS PLACEMENT (IOC);  Surgeon: Sallee Lange, MD;  Location: [facility];  Service: Ophthalmology;  Laterality: Right;  [place]    00:48.2 AP%   23.5 CDE   22.58 fluid pack lot # [id number]   CATARACT EXTRACTION W/PHACO Left 06/08/2018   Procedure: CATARACT EXTRACTION PHACO AND INTRAOCULAR LENS PLACEMENT (IOC);  Surgeon: Galen Manila, MD;  Location: [facility];  Service: Ophthalmology;  Laterality: Left;  [place] 00:28.4 AP% 13.0 CDE 3.68 Fluid pack Lot # [id number]   CHOLECYSTECTOMY N/A 05/19/2019   Procedure: LAPAROSCOPIC CHOLECYSTECTOMY;  Surgeon: Duanne Guess, MD;  Location: [facility];  Service: General;  Laterality: N/A;   TUBAL LIGATION      Family History  Problem Relation Age of Onset   Hyperlipidemia Mother    Hyperlipidemia Father    Hyperlipidemia Sister    Diabetes Brother    Hyperlipidemia Brother     Social History   Tobacco Use   Smoking status: Never   Smokeless tobacco: Never  Vaping Use   Vaping Use: Never used  Substance Use Topics   Alcohol use: No    Alcohol/week: 0.0 standard drinks of alcohol   Drug  use: No     No Known Allergies  Health Maintenance  Topic Date Due   Diabetic kidney evaluation - Urine ACR  Never done   MAMMOGRAM  Never done   DEXA SCAN  Never done   FOOT EXAM  03/12/2022   OPHTHALMOLOGY EXAM  05/17/2022   COLONOSCOPY (Pts 45-77yrs Insurance coverage will need to be confirmed)  09/02/2022 (Originally 12/01/2000)   INFLUENZA VACCINE  02/15/2023 (Originally 06/17/2022)   HEMOGLOBIN A1C  09/02/2022   Diabetic kidney evaluation - GFR measurement  03/04/2023   TETANUS/TDAP  01/19/2029   Pneumonia Vaccine 16+  Years old  Completed   Hepatitis C Screening  Completed   HPV VACCINES  Aged Out   COVID-19 Vaccine  Discontinued   Zoster Vaccines- Shingrix  Discontinued    Chart Review Today: I personally reviewed active problem list, medication list, allergies, family history, social history, health maintenance, notes from last encounter, lab results, imaging with the patient/caregiver today.   Review of Systems  Constitutional: Negative.   HENT: Negative.    Eyes: Negative.   Respiratory: Negative.    Cardiovascular: Negative.   Gastrointestinal: Negative.   Endocrine: Negative.   Genitourinary: Negative.   Musculoskeletal: Negative.   Skin: Negative.   Allergic/Immunologic: Negative.   Neurological: Negative.   Hematological: Negative.   Psychiatric/Behavioral: Negative.    All other systems reviewed and are negative.    Objective:   Vitals:   08/19/22 1152  BP: 130/72  Pulse: 79  Resp: 16  Temp: 97.9 F (36.6 C)  TempSrc: Oral  SpO2: 97%  Weight: 158 lb (71.7 kg)  Height: 5' (1.524 m)    Body mass index is 30.86 kg/m.  Physical Exam Vitals and nursing note reviewed.  Constitutional:      General: She is not in acute distress.    Appearance: Normal appearance. She is not ill-appearing, toxic-appearing or diaphoretic.  HENT:     Head: Normocephalic and atraumatic.     Right Ear: Tympanic membrane, ear canal and external ear normal. There is no impacted cerumen.     Left Ear: Tympanic membrane, ear canal and external ear normal.     Nose: Nose normal. No congestion or rhinorrhea.     Mouth/Throat:     Mouth: Mucous membranes are moist.     Pharynx: Oropharynx is clear. No oropharyngeal exudate or posterior oropharyngeal erythema.  Eyes:     General: No scleral icterus.       Right eye: No discharge.        Left eye: No discharge.     Conjunctiva/sclera: Conjunctivae normal.     Pupils: Pupils are equal, round, and reactive to light.  Cardiovascular:     Rate and  Rhythm: Normal rate and regular rhythm.     Pulses: Normal pulses.     Heart sounds: Normal heart sounds.  Pulmonary:     Effort: Pulmonary effort is normal.     Breath sounds: Normal breath sounds.  Musculoskeletal:     Cervical back: Normal range of motion. No rigidity.     Right lower leg: No edema.     Left lower leg: No edema.  Lymphadenopathy:     Cervical: No cervical adenopathy.  Skin:    General: Skin is warm and dry.     Coloration: Skin is not jaundiced or pale.  Neurological:     Mental Status: She is alert.     Cranial Nerves: No cranial nerve deficit, dysarthria or facial asymmetry.  Sensory: Sensation is intact.     Motor: No weakness, tremor, abnormal muscle tone or pronator drift.     Coordination: Coordination is intact.     Gait: Gait is intact.     Comments: MENTAL STATUS: AAOx3  LANG/SPEECH: speech clear and per her baseline, no dysarthria, follows 3-step commands   CRANIAL NERVES:   II: Pupils equal and reactive, no RAPD   III, IV, VI: EOM intact, no gaze preference or deviation, no nystagmus.   V: normal sensation in V1, V2, and V3 segments bilaterally   VII: no asymmetry, no nasolabial fold flattening   VIII: normal hearing to speech   IX, X: normal palatal elevation, no uvular deviation   XI: 5/5 head turn and 5/5 shoulder shrug bilaterally   XII: midline tongue protrusion  MOTOR:  5/5 bilateral grip strength 5/5 strength dorsiflexion/plantarflexion b/l  SENSORY:  Normal to light touch Romberg absent  COORD: Normal finger to nose and heel to shin, no tremor, no dysmetria  STATION: normal stance, no truncal ataxia  GAIT: Normal    Psychiatric:        Mood and Affect: Mood normal.        Behavior: Behavior normal.         Assessment & Plan:   1. Vertigo Pt presents with vertigo sx onset 2 days ago, reproducible with head movement and position changes, with negative orthostatics, sx brief, non-focal neuro exam and no other red  flags concerning for central vertigo or stroke. VSS, pt well appearing, normal ambulation here in the room. Suspect BPPV, discussed meclizine very low dose which may by minimally helpful if she is having frequent sx - I have encouraged her to avoid head and position movements that cause the vertigo - and to be cautions with getting out of bed or laying to sitting up, also to avoid looking down then up.  Discussed and reviewed that meclizine may help sx some but cause drowsiness and it would be best to get into PT for eval and tx - likely pts sx will improve with epley maneuver which I am unable to do correctly or safely in clinic.   - Ambulatory referral to Physical Therapy - meclizine (ANTIVERT) 25 MG tablet; Take 0.5 tablets (12.5 mg total) by mouth 3 (three) times daily as needed for dizziness.  Dispense: 20 tablet; Refill: 0 - Orthostatic vital signs - reviewed and normal - though pt was sx when sitting up and with standing when she looked down then up   Reviewed all instructions and meds and f/up care and PT referral with pt and with her daughter - all questions asked and answered She will be due for labs soon - she has been my patient until earlier this year - per her request she will be seeing a different provider for the first time in about 2 weeks - at that time DM and HLD care should be done  I explained I was happy to still see her since I know of her hx over the past several years - but appt and PCP is definitely up to her.   DM - well controlled on metformin XR 1000 mg once daily  Lab Results  Component Value Date   HGBA1C 5.7 (H) 03/03/2022   She has chronic leg pain does not seem to be related to DM, statins, or any deficiencies - we have checked for this several times, holding statin did not make any difference, prior ABI normal bilaterally 02/2020  HLD  was well controlled, last labs done Oct 2022 - will be due at f/up appt  Prior lung nodules and mediastinal lymphadenopathy with  stable imaging 2020-2022 - requiring no further monitoring: Last CT chest 03/22/21: IMPRESSION: 1. Stable lung nodularity from 05/31/2019, consistent with a benign etiology. Per consensus guidelines, no additional follow-up necessary. This recommendation follows the consensus statement: Guidelines for Management of Small Pulmonary Nodules Detected on CT Images: From the Fleischner Society 2017; Radiology 2017; 284:228-243. 2. Stable partially calcified mediastinal and hilar lymph nodes, suggesting prior granulomatous disease. In combination, these findings may represent sarcoidosis. 3. No suspicious pulmonary nodules or acute findings. 4. Mild coronary and Aortic Atherosclerosis (ICD10-I70.0).  Pt often hesitant to do cancer screening or preventative med/vaccines etc.  Often comes with one of her two daughters    Return for follow up with PT, let us know if they do not improve symptoms.  Keep scheduled f/up appt in about 2 weeks  Danelle Berry, PA-C 08/19/22 12:19 PM

## 2022-08-19 NOTE — Patient Instructions (Signed)
Benign Positional Vertigo Vertigo is the feeling that you or your surroundings are moving when they are not. Benign positional vertigo is the most common form of vertigo. This is usually a harmless condition (benign). This condition is positional. This means that symptoms are triggered by certain movements and positions. This condition can be dangerous if it occurs while you are doing something that could cause harm to yourself or others. This includes activities such as driving or operating machinery. What are the causes? The inner ear has fluid-filled canals that help your brain sense movement and balance. When the fluid moves, the brain receives messages about your body's position. With benign positional vertigo, calcium crystals in the inner ear break free and disturb the inner ear area. This causes your brain to receive confusing messages about your body's position. What increases the risk? You are more likely to develop this condition if: You are a woman. You are 50 years of age or older. You have recently had a head injury. You have an inner ear disease. What are the signs or symptoms? Symptoms of this condition usually happen when you move your head or your eyes in different directions. Symptoms may start suddenly and usually last for less than a minute. They include: Loss of balance and falling. Feeling like you are spinning or moving. Feeling like your surroundings are spinning or moving. Nausea and vomiting. Blurred vision. Dizziness. Involuntary eye movement (nystagmus). Symptoms can be mild and cause only minor problems, or they can be severe and interfere with daily life. Episodes of benign positional vertigo may return (recur) over time. Symptoms may also improve over time. How is this diagnosed? This condition may be diagnosed based on: Your medical history. A physical exam of the head, neck, and ears. Positional tests to check for or stimulate vertigo. You may be asked to  turn your head and change positions, such as going from sitting to lying down. A health care provider will watch for symptoms of vertigo. You may be referred to a health care provider who specializes in ear, nose, and throat problems (ENT or otolaryngologist) or a provider who specializes in disorders of the nervous system (neurologist). How is this treated?  This condition may be treated in a session in which your health care provider moves your head in specific positions to help the displaced crystals in your inner ear move. Treatment for this condition may take several sessions. Surgery may be needed in severe cases, but this is rare. In some cases, benign positional vertigo may resolve on its own in 2-4 weeks. Follow these instructions at home: Safety Move slowly. Avoid sudden body or head movements or certain positions, as told by your health care provider. Avoid driving or operating machinery until your health care provider says it is safe. Avoid doing any tasks that would be dangerous to you or others if vertigo occurs. If you have trouble walking or keeping your balance, try using a cane for stability. If you feel dizzy or unstable, sit down right away. Return to your normal activities as told by your health care provider. Ask your health care provider what activities are safe for you. General instructions Take over-the-counter and prescription medicines only as told by your health care provider. Drink enough fluid to keep your urine pale yellow. Keep all follow-up visits. This is important. Contact a health care provider if: You have a fever. Your condition gets worse or you develop new symptoms. Your family or friends notice any behavioral changes.   You have nausea or vomiting that gets worse. You have numbness or a prickling and tingling sensation. Get help right away if you: Have difficulty speaking or moving. Are always dizzy or faint. Develop severe headaches. Have weakness in  your legs or arms. Have changes in your hearing or vision. Develop a stiff neck. Develop sensitivity to light. These symptoms may represent a serious problem that is an emergency. Do not wait to see if the symptoms will go away. Get medical help right away. Call your local emergency services (911 in the [place] Do not drive yourself to the hospital. Summary Vertigo is the feeling that you or your surroundings are moving when they are not. Benign positional vertigo is the most common form of vertigo. This condition is caused by calcium crystals in the inner ear that become displaced. This causes a disturbance in an area of the inner ear that helps your brain sense movement and balance. Symptoms include loss of balance and falling, feeling that you or your surroundings are moving, nausea and vomiting, and blurred vision. This condition can be diagnosed based on symptoms, a physical exam, and positional tests. Follow safety instructions as told by your health care provider and keep all follow-up visits. This is important. This information is not intended to replace advice given to you by your health care provider. Make sure you discuss any questions you have with your health care provider. Document Revised: 10/03/2020 Document Reviewed: 10/03/2020 Elsevier Patient Education  2023 Elsevier Inc.  

## 2022-08-19 NOTE — Telephone Encounter (Signed)
Summary: dizziness   Pt daughter stated on the line that pt is feeling dizzy for about two days. Pt is currently not with her; pt has mentioned she feels like she is going to fall when she walks.  Daughter mentioned that pt says when she looks at the ground, it feels like everything is going in circles.    Pt daughter stated no other symptoms at this time.   Pt daughter seeking clinical advice.      Chief Complaint: dizziness and vertigo Symptoms: see above Frequency: 2 days ago Pertinent Negatives: Patient denies  fever, chest pain, vomiting, diarrhea, bleeding Disposition: [] ED /[] Urgent Care (no appt availability in office) / [x] Appointment(In office/virtual)/ []  [facility]/ [] Home Care/ [] Refused Recommended Disposition /[] [facility]/ []  Follow-up with PCP Additional Notes: n/a  Reason for Disposition  [1] MILD dizziness (e.g., walking normally) AND [2] has been evaluated by doctor (or NP/PA) for this  Answer Assessment - Initial Assessment Questions 1. DESCRIPTION: "Describe your dizziness."     When walking, pt stated she feels like is going to pass out 2. LIGHTHEADED: "Do you feel lightheaded?" (e.g., somewhat faint, woozy, weak upon standing)     yes 3. VERTIGO: "Do you feel like either you or the room is spinning or tilting?" (i.e. vertigo)     yes 4. SEVERITY: "How bad is it?"  "Do you feel like you are going to faint?" "Can you stand and walk?"   - MILD: Feels slightly dizzy, but walking normally.   - MODERATE: Feels unsteady when walking, but not falling; interferes with normal activities (e.g., school, work).   - SEVERE: Unable to walk without falling, or requires assistance to walk without falling; feels like passing out now.      mild 5. ONSET:  "When did the dizziness begin?"     2 days 6. AGGRAVATING FACTORS: "Does anything make it worse?" (e.g., standing, change in head position)     Standing, looking down 7. HEART RATE: "Can you tell me  your heart rate?" "How many beats in 15 seconds?"  (Note: not all patients can do this)       N/a 8. CAUSE: "What do you think is causing the dizziness?"     unsure 9. RECURRENT SYMPTOM: "Have you had dizziness before?" If Yes, ask: "When was the last time?" "What happened that time?"     Last year- head flu - med abx 10. OTHER SYMPTOMS: "Do you have any other symptoms?" (e.g., fever, chest pain, vomiting, diarrhea, bleeding)       no 11. PREGNANCY: "Is there any chance you are pregnant?" "When was your last menstrual period?"       N/a  Protocols used: Dizziness - Lightheadedness-A-AH

## 2022-08-21 DIAGNOSIS — R42 Dizziness and giddiness: Secondary | ICD-10-CM | POA: Diagnosis not present

## 2022-09-02 ENCOUNTER — Ambulatory Visit: Payer: PRIVATE HEALTH INSURANCE | Admitting: Internal Medicine

## 2022-09-02 NOTE — Progress Notes (Deleted)
   Established Patient Office Visit  Subjective   Patient ID: Vali Capano, female    DOB: 1956/09/18  Age: 66 y.o. MRN: [id number]  No chief complaint on file.   HPI  Patient is here for follow up on chronic  medical conditions.   HLD: -Medications: Crestor 5 mg  -Patient is compliant with above medications and reports no side effects. *** -Last lipid panel: Lipid Panel     Component Value Date/Time   CHOL 153 09/02/2021 1503   CHOL 178 09/19/2015 0853   TRIG 65 09/02/2021 1503   HDL 56 09/02/2021 1503   HDL 47 09/19/2015 0853   CHOLHDL 2.7 09/02/2021 1503   VLDL 10 01/30/2017 1613   LDLCALC 83 09/02/2021 1503   LABVLDL 13 09/19/2015 0853   The 10-year ASCVD risk score (Arnett DK, et al., 2019) is: 10.5%   Values used to calculate the score:     Age: 27 years     Sex: Female     Is Non-Hispanic African American: No     Diabetic: Yes     Tobacco smoker: No     Systolic Blood Pressure: 502 mmHg     Is BP treated: No     HDL Cholesterol: 56 mg/dL     Total Cholesterol: 153 mg/dL  Pre-Diabetes: -Last A1c 5.7% -She is not on any medications   Health Maintenance: -Blood work due -Mammogram:  -Colon cancer screening:     {History (Optional):23778}  ROS    Objective:     There were no vitals taken for this visit. {Vitals History (Optional):23777}  Physical Exam   No results found for any visits on 09/02/22.  {Labs (Optional):23779}  The 10-year ASCVD risk score (Arnett DK, et al., 2019) is: 10.5%    Assessment & Plan:   Problem List Items Addressed This Visit   None   No follow-ups on file.    Teodora Medici, DO

## 2022-09-17 NOTE — Progress Notes (Unsigned)
   Established Patient Office Visit  Subjective   Patient ID: Kristine Alexander, female    DOB: 1956-04-27  Age: 66 y.o. MRN: [id number]  No chief complaint on file.   HPI  Patient is here for follow up on chronic medical conditions.   Diabetes, Type 2:  -Last A1c 5.7 4/23 -Medications: Metformin 500 mg XR -Patient is compliant with the above medications and reports no side effects. *** -Checking BG at home: *** -Fasting home BG: *** -Post-prandial home BG: *** -Highest home BG since last visit: *** -Lowest home BG since last visit: *** -Diet: *** -Exercise: *** -Eye exam: Due -Foot exam: Due -Microalbumin: Due -Statin: Yes -PNA vaccine: Yes -Denies symptoms of hypoglycemia, polyuria, polydipsia, numbness extremities, foot ulcers/trauma. ***  HLD: -Medications: Crestor 5 mg -Patient is compliant with above medications and reports no side effects.  -Last lipid panel: Lipid Panel     Component Value Date/Time   CHOL 153 09/02/2021 1503   CHOL 178 09/19/2015 0853   TRIG 65 09/02/2021 1503   HDL 56 09/02/2021 1503   HDL 47 09/19/2015 0853   CHOLHDL 2.7 09/02/2021 1503   VLDL 10 01/30/2017 1613   LDLCALC 83 09/02/2021 1503   LABVLDL 13 09/19/2015 0853   The 10-year ASCVD risk score (Arnett DK, et al., 2019) is: 10.5%   Values used to calculate the score:     Age: 7 years     Sex: Female     Is Non-Hispanic African American: No     Diabetic: Yes     Tobacco smoker: No     Systolic Blood Pressure: 469 mmHg     Is BP treated: No     HDL Cholesterol: 56 mg/dL     Total Cholesterol: 153 mg/dL  Health maintenance: -Blood work -Mammogram due -Colon cancer screening due   {History (Optional):23778}  ROS    Objective:     There were no vitals taken for this visit. {Vitals History (Optional):23777}  Physical Exam   No results found for any visits on 09/18/22.  {Labs (Optional):23779}  The 10-year ASCVD risk score (Arnett DK, et al., 2019) is:  10.5%    Assessment & Plan:   Problem List Items Addressed This Visit   None   No follow-ups on file.    Teodora Medici, DO

## 2022-09-18 ENCOUNTER — Encounter: Payer: Self-pay | Admitting: Internal Medicine

## 2022-09-18 ENCOUNTER — Ambulatory Visit (INDEPENDENT_AMBULATORY_CARE_PROVIDER_SITE_OTHER): Payer: 59 | Admitting: Internal Medicine

## 2022-09-18 VITALS — BP 122/68 | HR 93 | Temp 98.1°F | Resp 18 | Ht 60.0 in | Wt 159.3 lb

## 2022-09-18 DIAGNOSIS — E785 Hyperlipidemia, unspecified: Secondary | ICD-10-CM

## 2022-09-18 DIAGNOSIS — E119 Type 2 diabetes mellitus without complications: Secondary | ICD-10-CM

## 2022-09-18 DIAGNOSIS — Z23 Encounter for immunization: Secondary | ICD-10-CM | POA: Diagnosis not present

## 2022-09-18 MED ORDER — ROSUVASTATIN CALCIUM 5 MG PO TABS: 5.0000 mg | ORAL_TABLET | Freq: Every day | ORAL | 1 refills | Status: DC

## 2022-09-18 NOTE — Patient Instructions (Signed)
It was great seeing you today!  Plan discussed at today's visit: -Blood work ordered today, results will be uploaded to [facility].  -Cholesterol medication refilled  -Flu vaccine today   Follow up in: 6 months   Take care and let us know if you have any questions or concerns prior to your next visit.  Dr. Rosana Berger

## 2022-09-19 LAB — COMPLETE METABOLIC PANEL WITH GFR
AG Ratio: 1.2 (calc) (ref 1.0–2.5)
ALT: 13 U/L (ref 6–29)
AST: 17 U/L (ref 10–35)
Albumin: 4.1 g/dL (ref 3.6–5.1)
Alkaline phosphatase (APISO): 52 U/L (ref 37–153)
BUN: 11 mg/dL (ref 7–25)
CO2: 27 mmol/L (ref 20–32)
Calcium: 9.4 mg/dL (ref 8.6–10.4)
Chloride: 104 mmol/L (ref 98–110)
Creat: 0.59 mg/dL (ref 0.50–1.05)
Globulin: 3.4 g/dL (calc) (ref 1.9–3.7)
Glucose, Bld: 92 mg/dL (ref 65–99)
Potassium: 4.1 mmol/L (ref 3.5–5.3)
Sodium: 138 mmol/L (ref 135–146)
Total Bilirubin: 0.3 mg/dL (ref 0.2–1.2)
Total Protein: 7.5 g/dL (ref 6.1–8.1)
eGFR: 99 mL/min/{1.73_m2} (ref >=60)

## 2022-09-19 LAB — CBC WITH DIFFERENTIAL/PLATELET
Absolute Monocytes: 528 cells/uL (ref 200–950)
Basophils Absolute: 40 cells/uL (ref 0–200)
Basophils Relative: 0.5 %
Eosinophils Absolute: 256 cells/uL (ref 15–500)
Eosinophils Relative: 3.2 %
HCT: 34.6 % — ABNORMAL LOW (ref 35.0–45.0)
Hemoglobin: 11.6 g/dL — ABNORMAL LOW (ref 11.7–15.5)
Lymphs Abs: 2720 cells/uL (ref 850–3900)
MCH: 26.4 pg — ABNORMAL LOW (ref 27.0–33.0)
MCHC: 33.5 g/dL (ref 32.0–36.0)
MCV: 78.8 fL — ABNORMAL LOW (ref 80.0–100.0)
MPV: 10.1 fL (ref 7.5–12.5)
Monocytes Relative: 6.6 %
Neutro Abs: 4456 cells/uL (ref 1500–7800)
Neutrophils Relative %: 55.7 %
Platelets: 282 10*3/uL (ref 140–400)
RBC: 4.39 10*6/uL (ref 3.80–5.10)
RDW: 13.8 % (ref 11.0–15.0)
Total Lymphocyte: 34 %
WBC: 8 10*3/uL (ref 3.8–10.8)

## 2022-09-19 LAB — HEMOGLOBIN A1C
Hgb A1c MFr Bld: 6.1 % of total Hgb — ABNORMAL HIGH (ref <5.7)
Mean Plasma Glucose: 128 mg/dL
eAG (mmol/L): 7.1 mmol/L

## 2022-09-19 LAB — MICROALBUMIN / CREATININE URINE RATIO
Creatinine, Urine: 14 mg/dL — ABNORMAL LOW (ref 20–275)
Microalb Creat Ratio: 14 mcg/mg creat (ref <30)
Microalb, Ur: 0.2 mg/dL

## 2022-09-19 LAB — LIPID PANEL
Cholesterol: 137 mg/dL (ref <200)
HDL: 50 mg/dL (ref >=50)
LDL Cholesterol (Calc): 69 mg/dL (calc)
Non-HDL Cholesterol (Calc): 87 mg/dL (calc) (ref <130)
Total CHOL/HDL Ratio: 2.7 (calc) (ref <5.0)
Triglycerides: 93 mg/dL (ref <150)

## 2022-09-25 ENCOUNTER — Other Ambulatory Visit: Payer: Self-pay

## 2022-09-25 DIAGNOSIS — E119 Type 2 diabetes mellitus without complications: Secondary | ICD-10-CM

## 2022-11-07 DIAGNOSIS — E119 Type 2 diabetes mellitus without complications: Secondary | ICD-10-CM | POA: Diagnosis not present

## 2022-11-07 LAB — HM DIABETES EYE EXAM

## 2023-01-05 ENCOUNTER — Encounter: Payer: Self-pay | Admitting: Family Medicine

## 2023-01-05 ENCOUNTER — Ambulatory Visit (INDEPENDENT_AMBULATORY_CARE_PROVIDER_SITE_OTHER): Payer: 59 | Admitting: Family Medicine

## 2023-01-05 VITALS — BP 130/74 | HR 85 | Temp 97.5°F | Resp 16 | Ht 60.0 in | Wt 160.9 lb

## 2023-01-05 DIAGNOSIS — M25522 Pain in left elbow: Secondary | ICD-10-CM

## 2023-01-05 DIAGNOSIS — M79632 Pain in left forearm: Secondary | ICD-10-CM | POA: Diagnosis not present

## 2023-01-05 MED ORDER — MELOXICAM 15 MG PO TABS: 15.0000 mg | ORAL_TABLET | Freq: Every day | ORAL | 0 refills | Status: DC

## 2023-01-05 NOTE — Progress Notes (Signed)
Patient ID: Kristine Alexander, female    DOB: [birth year], 67 y.o.   MRN: [id number]  PCP: Teodora Medici, DO  Chief Complaint  Patient presents with   Arm Pain    Left forearm, Pt states hurts a lot while being active. Onset for a month    Subjective:   Kristine Alexander is a 67 y.o. female, presents to clinic with CC of the following:  HPI  Left forearm and elbow pain onset about a month ago Worse with movement and palpation Pain goes away with complete rest She believes it started after pulling and lifting some heavy bags She had no fall, injury, redness, swelling, bruising She has tried tylenol for discomfort  Patient Active Problem List   Diagnosis Date Noted   Mass of upper lobe of lung 02/29/2020   Thoracic aorta atherosclerosis ([place]) 08/17/2019   Hyperlipidemia associated with type 2 diabetes mellitus ([place]) 08/17/2019   Mediastinal lymphadenopathy 08/17/2019   Lung nodule, multiple [id number]   Aortic systolic murmur on examination 08/17/2019   S/P cholecystectomy 08/17/2019   Hepatic steatosis 03/22/2019   Prediabetes 08/20/2017   Anemia 08/20/2017   Vitamin B12 deficiency 08/20/2017   Cardiac murmur 09/18/2015   Class 1 obesity with serious comorbidity and body mass index (BMI) of 33.0 to 33.9 in adult 05/16/2015   Osteoarthrosis, generalized, multiple joints 05/16/2015   Hyperlipidemia 05/16/2015      Current Outpatient Medications:    meclizine (ANTIVERT) 25 MG tablet, Take 0.5 tablets (12.5 mg total) by mouth 3 (three) times daily as needed for dizziness., Disp: 20 tablet, Rfl: 0   metFORMIN (GLUCOPHAGE-XR) 500 MG 24 hr tablet, TAKE 2 TABLETS(1000 MG) BY MOUTH DAILY, Disp: 180 tablet, Rfl: 3   rosuvastatin (CRESTOR) 5 MG tablet, Take 1 tablet (5 mg total) by mouth daily., Disp: 90 tablet, Rfl: 1   No Known Allergies   Social History   Tobacco Use   Smoking status: Never   Smokeless tobacco: Never  Vaping Use   Vaping Use: Never used   Substance Use Topics   Alcohol use: No    Alcohol/week: 0.0 standard drinks of alcohol   Drug use: No      Chart Review Today: I personally reviewed active problem list, medication list, allergies, family history, social history, health maintenance, notes from last encounter, lab results, imaging with the patient/caregiver today.   Review of Systems  Constitutional: Negative.   HENT: Negative.    Eyes: Negative.   Respiratory: Negative.    Cardiovascular: Negative.   Gastrointestinal: Negative.   Endocrine: Negative.   Genitourinary: Negative.   Musculoskeletal: Negative.   Skin: Negative.   Allergic/Immunologic: Negative.   Neurological: Negative.   Hematological: Negative.   Psychiatric/Behavioral: Negative.    All other systems reviewed and are negative.      Objective:   Vitals:   01/05/23 1025  BP: 130/74  Pulse: 85  Resp: 16  Temp: (!) 97.5 F (36.4 C)  TempSrc: Oral  SpO2: 97%  Weight: 160 lb 14.4 oz (73 kg)  Height: 5' (1.524 m)    Body mass index is 31.42 kg/m.  Physical Exam Vitals and nursing note reviewed.  Constitutional:      Appearance: She is well-developed.  HENT:     Head: Normocephalic and atraumatic.     Nose: Nose normal.  Eyes:     General:        Right eye: No discharge.        Left eye: No discharge.  Conjunctiva/sclera: Conjunctivae normal.  Neck:     Trachea: No tracheal deviation.  Cardiovascular:     Rate and Rhythm: Normal rate and regular rhythm.  Pulmonary:     Effort: Pulmonary effort is normal. No respiratory distress.     Breath sounds: No stridor.  Musculoskeletal:        General: Normal range of motion.     Right elbow: Normal. Normal range of motion.     Left elbow: No swelling, deformity or lacerations. Normal range of motion. No tenderness (no bony tenderness).     Left forearm: Tenderness (generalized to proximal anterior forearm and inferior AC fossa) present. No swelling, edema, deformity, lacerations  or bony tenderness.     Comments: Left arm/ac fossa - generalized tenderness no erythema, no masses, swelling Soft tissue and muscles tender with palpation and with movement, muscle compartments soft  Skin:    General: Skin is warm and dry.     Findings: No rash.  Neurological:     Mental Status: She is alert.     Motor: No abnormal muscle tone.     Coordination: Coordination normal.  Psychiatric:        Behavior: Behavior normal.      Results for orders placed or performed in visit on 11/13/22  HM DIABETES EYE EXAM  Result Value Ref Range   HM Diabetic Eye Exam No Retinopathy No Retinopathy       Assessment & Plan:   1. Left forearm pain 2. Left elbow pain X 1 month not improved with rest and tylenol, possibly started after pulling and lifting something heavy Generalized tenderness to anterior prox left forearm - seems tender to soft tissue and muscles - seems to hurt with any palpation and movement No bony tenderness No edema, erythema Can try rest, ice, elbow brace or strap, meloxicam and then see ortho for further eval Defer imaging to specialists   - Ambulatory referral to [place], PA-C 01/05/23 10:43 AM

## 2023-01-05 NOTE — Patient Instructions (Addendum)
[place], [place] [address] Phone: [phone] [email or web address]  Golfer's Elbow   Golfer's elbow (medial epicondylitis) is a condition that results from inflammation of the strong bands of tissue (tendons) that attach your forearm muscles to the inside of your bone at the elbow. These tendons affect the muscles that bend the palm toward the wrist (flexion). The tendons become less flexible with age. This condition is called golfer's elbow because it is more common among people who constantly bend and twist their wrists, such as golfers. This injury is usually caused by repeated use of the same muscles. What are the causes? This condition is caused by: Repeatedly flexing, turning, or twisting your wrist. Frequently gripping objects with your hands. Sudden injury. What increases the risk? This condition is more likely to develop in people who play golf, baseball, or tennis. This injury is more common among people who have jobs that require the constant use of their hands, such as: People who use computers. Carpenters. Butchers. Musicians. What are the signs or symptoms? This condition causes elbow pain that may spread to your forearm and upper arm. Symptoms of this condition include: Pain at the inner elbow, forearm, or wrist. A weak grip in the hand. The pain may get worse when you bend your wrist downward. How is this diagnosed? This condition is diagnosed based on your symptoms, your medical history, and a physical exam. During the exam, your health care provider may: Test your grip strength. Move your wrist to check for pain. You may also have an MRI to: Confirm the diagnosis. Look for other issues. Check for tears in the ligaments, muscles, or tendons. How is this treated? Treatment for this condition includes: Stopping all activities that make you bend or twist your elbow or wrist and waiting until  your pain and other symptoms go away before resuming those activities. Wearing an elbow brace or wrist splint to restrict the movements that cause symptoms. Icing your inner elbow, forearm, or wrist to relieve pain. Taking NSAIDs, such as ibuprofen, or getting corticosteroid injections to reduce pain and swelling. Doing stretching, range-of-motion, and strengthening exercises (physical therapy) as told by your health care provider. In rare cases, surgery may be needed if your condition does not improve. Follow these instructions at home: If you have a brace or splint: Wear the brace or splint as told by your health care provider. Remove it only as told by your health care provider. Check the skin around the brace or splint every day. Tell your health care provider about any concerns. Loosen the brace or splint if your fingers tingle, become numb, or turn cold and blue. Keep it clean. If the brace or splint is not waterproof: Do not let it get wet. Cover it with a watertight covering when you take a bath or shower. Managing pain, stiffness, and swelling  If directed, put ice on the injured area. To do this: If you have a removable brace or splint, remove it as told by your health care provider. Put ice in a plastic bag. Place a towel between your skin and the bag. Leave the ice on for 20 minutes, 2-3 times a day. Remove the ice if your skin turns bright red. This is very important. If you cannot feel pain, heat, or cold, you have a greater risk of damage to the area. Move your fingers often to avoid stiffness and swelling. Activity Rest your injured area as told by your health care  provider. Return to your normal activities as told by your health care provider. Ask your health care provider what activities are safe for you. Do exercises as told by your health care provider. Lifestyle If your condition is caused by sports, work with a trainer to make sure that you: Use the correct  technique. Use the proper equipment. If your condition is work related, talk with your employer about ways to manage your condition at work. General instructions Take over-the-counter and prescription medicines only as told by your health care provider. Do not use any products that contain nicotine or tobacco. These products include cigarettes, chewing tobacco, and vaping devices, such as e-cigarettes. If you need help quitting, ask your health care provider. Keep all follow-up visits. This is important. How is this prevented? Before and after activity: Warm up and stretch before being active. Cool down and stretch after being active. Give your body time to rest between periods of activity. During activity: Make sure to use equipment that fits you. If you play golf, slow your golf swing to reduce shock in the arm when making contact with the ball. Maintain physical fitness, including: Strength. Flexibility. Endurance. Do exercises to strengthen the forearm muscles. Contact a health care provider if: Your pain does not improve or it gets worse. You notice numbness in your hand. Get help right away if: Your pain is severe. You cannot move your wrist. Summary Golfer's elbow, also called medial epicondylitis, is a condition that results from inflammation of the strong bands of tissue (tendons) that attach your forearm muscles to the inside of your bone at the elbow. This injury usually results from overuse. Symptoms of this condition include decreased grip strength and pain at the inner elbow, forearm, or wrist. This injury is treated with rest, a brace or splint, ice, medicines, physical therapy, and surgery as needed. This information is not intended to replace advice given to you by your health care provider. Make sure you discuss any questions you have with your health care provider. Document Revised: 05/15/2020 Document Reviewed: 05/15/2020 Elsevier Patient Education  [employer].

## 2023-01-06 DIAGNOSIS — M7702 Medial epicondylitis, left elbow: Secondary | ICD-10-CM | POA: Diagnosis not present

## 2023-01-13 ENCOUNTER — Emergency Department
Admission: EM | Admit: 2023-01-13 | Discharge: 2023-01-13 | Disposition: A | Discharge status: Home / Self Care | Payer: 59 | Attending: Emergency Medicine | Admitting: Emergency Medicine

## 2023-01-13 ENCOUNTER — Other Ambulatory Visit: Payer: Self-pay

## 2023-01-13 ENCOUNTER — Encounter: Payer: Self-pay | Admitting: Emergency Medicine

## 2023-01-13 DIAGNOSIS — R112 Nausea with vomiting, unspecified: Secondary | ICD-10-CM | POA: Diagnosis not present

## 2023-01-13 DIAGNOSIS — K529 Noninfective gastroenteritis and colitis, unspecified: Secondary | ICD-10-CM | POA: Insufficient documentation

## 2023-01-13 LAB — CBC
HCT: 42.1 % (ref 36.0–46.0)
Hemoglobin: 13.2 g/dL (ref 12.0–15.0)
MCH: 25.6 pg — ABNORMAL LOW (ref 26.0–34.0)
MCHC: 31.4 g/dL (ref 30.0–36.0)
MCV: 81.7 fL (ref 80.0–100.0)
Platelets: 304 K/uL (ref 150–400)
RBC: 5.15 MIL/uL — ABNORMAL HIGH (ref 3.87–5.11)
RDW: 14.8 % (ref 11.5–15.5)
WBC: 17.4 K/uL — ABNORMAL HIGH (ref 4.0–10.5)
nRBC: 0 % (ref 0.0–0.2)

## 2023-01-13 LAB — BASIC METABOLIC PANEL WITH GFR
Anion gap: 10 (ref 5–15)
BUN: 16 mg/dL (ref 8–23)
CO2: 24 mmol/L (ref 22–32)
Calcium: 9.3 mg/dL (ref 8.9–10.3)
Chloride: 103 mmol/L (ref 98–111)
Creatinine, Ser: 0.57 mg/dL (ref 0.44–1.00)
GFR, Estimated: >60 mL/min
Glucose, Bld: 114 mg/dL — ABNORMAL HIGH (ref 70–99)
Potassium: 4.5 mmol/L (ref 3.5–5.1)
Sodium: 137 mmol/L (ref 135–145)

## 2023-01-13 MED ORDER — FENTANYL CITRATE PF 50 MCG/ML IJ SOSY
12.5000 ug | PREFILLED_SYRINGE | Freq: Once | INTRAMUSCULAR | Status: AC
  Administered 2023-01-13: 12.5 ug via INTRAVENOUS
  Filled 2023-01-13: qty 1

## 2023-01-13 MED ORDER — ONDANSETRON HCL 4 MG/2ML IJ SOLN
4.0000 mg | Freq: Once | INTRAMUSCULAR | Status: AC
  Administered 2023-01-13: 4 mg via INTRAVENOUS
  Filled 2023-01-13: qty 2

## 2023-01-13 MED ORDER — ONDANSETRON 4 MG PO TBDP: 4.0000 mg | ORAL_TABLET | Freq: Three times a day (TID) | ORAL | 0 refills | Status: DC | PRN

## 2023-01-13 MED ORDER — DICYCLOMINE HCL 10 MG PO CAPS: 10.0000 mg | ORAL_CAPSULE | Freq: Three times a day (TID) | ORAL | 0 refills | Status: DC | PRN

## 2023-01-13 MED ORDER — SODIUM CHLORIDE 0.9 % IV SOLN: Freq: Once | INTRAVENOUS | Status: AC

## 2023-01-13 NOTE — ED Provider Notes (Signed)
[facility] Provider Note    Event Date/Time   First MD Initiated Contact with Patient 01/13/23 [phone]     (approximate)   History   Emesis and Diarrhea  Daughter has requested to translate for her HPI  Annielee Barzee is a 67 y.o. female with a history of prediabetes who presents with complaints of nausea vomiting diarrhea and abdominal cramping which started overnight.  No other family members with symptoms at this time.  Patient does describe body aches as well     Physical Exam   Triage Vital Signs: ED Triage Vitals  Enc Vitals Group     BP 01/13/23 0711 (!) 156/87     Pulse Rate 01/13/23 0711 86     Resp 01/13/23 0711 16     Temp 01/13/23 0717 (!) 96.7 F (35.9 C)     Temp Source 01/13/23 0717 Axillary     SpO2 01/13/23 0711 95 %     Weight 01/13/23 0712 73 kg (160 lb 15 oz)     Height 01/13/23 0712 1.524 m (5')     Head Circumference --      Peak Flow --      Pain Score 01/13/23 0711 0     Pain Loc --      Pain Edu? --      Excl. in [place]? --     Most recent vital signs: Vitals:   01/13/23 0717 01/13/23 0804  BP:  134/80  Pulse:  74  Resp:  16  Temp: (!) 96.7 F (35.9 C)   SpO2:  98%     General: Awake, no distress.  CV:  Good peripheral perfusion.  Resp:  Normal effort.  Abd:  No distention.  Soft, nontender, reassuring exam Other:     ED Results / Procedures / Treatments   Labs (all labs ordered are listed, but only abnormal results are displayed) Labs Reviewed  CBC - Abnormal; Notable for the following components:      Result Value   WBC 17.4 (*)    RBC 5.15 (*)    MCH 25.6 (*)    All other components within normal limits  BASIC METABOLIC PANEL - Abnormal; Notable for the following components:   Glucose, Bld 114 (*)    All other components within normal limits     EKG     RADIOLOGY     PROCEDURES:  Critical Care performed:   Procedures   MEDICATIONS ORDERED IN ED: Medications  0.9 %  sodium  chloride infusion ( Intravenous New Bag/Given 01/13/23 0753)  ondansetron (ZOFRAN) injection 4 mg (4 mg Intravenous Given 01/13/23 0753)  fentaNYL (SUBLIMAZE) injection 12.5 mcg (12.5 mcg Intravenous Given 01/13/23 0801)     IMPRESSION / MDM / ASSESSMENT AND PLAN / ED COURSE  I reviewed the triage vital signs and the nursing notes. Patient's presentation is most consistent with acute illness / injury with system symptoms.  Patient presents with nausea vomiting diarrhea as detailed above, highly suspicious for norovirus infection which is rampant in the community at this time.  Not consistent with diverticulitis or colitis.  Nonbloody stool and vomitus.  Will check labs, give IV fluids, IV Zofran and reevaluate.  Lab work notable for elevated white blood cell count which is typical and viral gastroenteritis, electrolytes kidney function is reassuring.  Patient is feeling improved, so no indication for admission at this time, appropriate for discharge with strict return precautions, patient and daughter agree with this plan.  FINAL CLINICAL IMPRESSION(S) / ED DIAGNOSES   Final diagnoses:  Gastroenteritis     Rx / DC Orders   ED Discharge Orders          Ordered    ondansetron (ZOFRAN-ODT) 4 MG disintegrating tablet  Every 8 hours PRN        01/13/23 0832    dicyclomine (BENTYL) 10 MG capsule  Every 8 hours PRN        01/13/23 [id number]             Note:  This document was prepared using Dragon voice recognition software and may include unintentional dictation errors.   Lavonia Drafts, MD 01/13/23 [phone]

## 2023-01-13 NOTE — ED Triage Notes (Signed)
Pt reports n/v/d since last night. Abd pain intermittently, none at this time.

## 2023-01-29 DIAGNOSIS — M7702 Medial epicondylitis, left elbow: Secondary | ICD-10-CM | POA: Diagnosis not present

## 2023-03-17 NOTE — Progress Notes (Deleted)
Established Patient Office Visit  Subjective   Patient ID: Kristine Alexander, female    DOB: 08/09/1956  Age: 67 y.o. MRN: [id number]  No chief complaint on file.   HPI  Patient is here for follow up on chronic medical conditions. She is here with her daughter.   Pre-Diabetes: -Last A1c 6.1% 11/23 -Medications: Metformin 500 mg XR BID -Patient is compliant with the above medications and reports no side effects.  -Checking BG at home: Not checking -Eye exam: UTD -Foot exam: Due -Microalbumin: UTD 11/23 -Statin: Yes -PNA vaccine: Yes -Denies symptoms of hypoglycemia, polyuria, polydipsia, numbness extremities, foot ulcers/trauma.   HLD: -Medications: Crestor 5 mg - had Lipitor but this was switched due to muscle pains  -Patient is compliant with above medications and reports no side effects.  -Last lipid panel: Lipid Panel     Component Value Date/Time   CHOL 137 09/18/2022 1136   CHOL 178 09/19/2015 0853   TRIG 93 09/18/2022 1136   HDL 50 09/18/2022 1136   HDL 47 09/19/2015 0853   CHOLHDL 2.7 09/18/2022 1136   VLDL 10 01/30/2017 1613   LDLCALC 69 09/18/2022 1136   LABVLDL 13 09/19/2015 0853   The 10-year ASCVD risk score (Arnett DK, et al., 2019) is: 12.4%   Values used to calculate the score:     Age: 42 years     Sex: Female     Is Non-Hispanic African American: No     Diabetic: Yes     Tobacco smoker: No     Systolic Blood Pressure: 134 mmHg     Is BP treated: No     HDL Cholesterol: 50 mg/dL     Total Cholesterol: 137 mg/dL  Health maintenance: -Blood work UTD -Patient declines all further cancer screenings.   Patient Active Problem List   Diagnosis Date Noted   Mass of upper lobe of lung 02/29/2020   Thoracic aorta atherosclerosis (HCC) 08/17/2019   Hyperlipidemia associated with type 2 diabetes mellitus (HCC) 08/17/2019   Mediastinal lymphadenopathy 08/17/2019   Lung nodule, multiple 08/17/2019   Aortic systolic murmur on examination 08/17/2019    S/P cholecystectomy 08/17/2019   Hepatic steatosis 03/22/2019   Prediabetes 08/20/2017   Anemia 08/20/2017   Vitamin B12 deficiency 08/20/2017   Cardiac murmur 09/18/2015   Class 1 obesity with serious comorbidity and body mass index (BMI) of 33.0 to 33.9 in adult 05/16/2015   Osteoarthrosis, generalized, multiple joints 05/16/2015   Hyperlipidemia 05/16/2015   Past Medical History:  Diagnosis Date   Cholelithiases 03/22/2019   Diabetes mellitus without complication (HCC)    Hyperlipidemia    Iron deficiency anemia    Microcytosis 03/01/2017   chronic   Numbness 08/18/2017   Osteoarthritis of multiple joints    Prediabetes 08/20/2017   Past Surgical History:  Procedure Laterality Date   CATARACT EXTRACTION W/PHACO Right 02/25/2016   Procedure: CATARACT EXTRACTION PHACO AND INTRAOCULAR LENS PLACEMENT (IOC);  Surgeon: Sallee Lange, MD;  Location: [facility] ORS;  Service: Ophthalmology;  Laterality: Right;  [place]    00:48.2 AP%   23.5 CDE   22.58 fluid pack lot # [id number]   CATARACT EXTRACTION W/PHACO Left 06/08/2018   Procedure: CATARACT EXTRACTION PHACO AND INTRAOCULAR LENS PLACEMENT (IOC);  Surgeon: Galen Manila, MD;  Location: [facility] ORS;  Service: Ophthalmology;  Laterality: Left;  [place] 00:28.4 AP% 13.0 CDE 3.68 Fluid pack Lot # [id number]   CHOLECYSTECTOMY N/A 05/19/2019   Procedure: LAPAROSCOPIC CHOLECYSTECTOMY;  Surgeon: Duanne Guess, MD;  Location: [facility]  ORS;  Service: General;  Laterality: N/A;   TUBAL LIGATION     Social History   Tobacco Use   Smoking status: Never   Smokeless tobacco: Never  Vaping Use   Vaping Use: Never used  Substance Use Topics   Alcohol use: No    Alcohol/week: 0.0 standard drinks of alcohol   Drug use: No   Social History   Socioeconomic History   Marital status: Married    Spouse name: Not on file   Number of children: Not on file   Years of education: Not on file   Highest education level: Not on file  Occupational History   Not on  file  Tobacco Use   Smoking status: Never   Smokeless tobacco: Never  Vaping Use   Vaping Use: Never used  Substance and Sexual Activity   Alcohol use: No    Alcohol/week: 0.0 standard drinks of alcohol   Drug use: No   Sexual activity: Not Currently  Other Topics Concern   Not on file  Social History Narrative   Not on file   Social Determinants of Health   Financial Resource Strain: Low Risk  (08/31/2020)   Overall Financial Resource Strain (CARDIA)    Difficulty of Paying Living Expenses: Not hard at all  Food Insecurity: No Food Insecurity (08/31/2020)   Hunger Vital Sign    Worried About Running Out of Food in the Last Year: Never true    Ran Out of Food in the Last Year: Never true  Transportation Needs: No Transportation Needs (08/31/2020)   PRAPARE - Administrator, Civil Service (Medical): No    Lack of Transportation (Non-Medical): No  Physical Activity: Insufficiently Active (08/31/2020)   Exercise Vital Sign    Days of Exercise per Week: 5 days    Minutes of Exercise per Session: 10 min  Stress: No Stress Concern Present (08/31/2020)   [employer] of Occupational Health - Occupational Stress Questionnaire    Feeling of Stress : Only a little  Social Connections: Moderately Integrated (08/31/2020)   Social Connection and Isolation Panel [NHANES]    Frequency of Communication with Friends and Family: Once a week    Frequency of Social Gatherings with Friends and Family: Once a week    Attends Religious Services: 1 to 4 times per year    Active Member of [employer] or Organizations: Yes    Attends Banker Meetings: Never    Marital Status: Married  Catering manager Violence: Not At Risk (08/31/2020)   Humiliation, Afraid, Rape, and Kick questionnaire    Fear of Current or Ex-Partner: No    Emotionally Abused: No    Physically Abused: No    Sexually Abused: No   Family Status  Relation Name Status   Mother  Deceased   Father   Deceased   Sister 1 Audiological scientist  (Not Specified)   Daughter  Alive   Son  Alive   MGM  Deceased   MGF  Deceased   PGM  Deceased   PGF  Deceased   Daughter  Alive   Son  Alive   Son  Alive   Family History  Problem Relation Age of Onset   Hyperlipidemia Mother    Hyperlipidemia Father    Hyperlipidemia Sister    Diabetes Brother    Hyperlipidemia Brother    No Known Allergies    Review of Systems  Constitutional:  Negative for chills and fever.  Eyes:  Negative for blurred vision.  Respiratory:  Negative for shortness of breath.   Cardiovascular:  Negative for chest pain.  Neurological:  Negative for headaches.      Objective:     There were no vitals taken for this visit. BP Readings from Last 3 Encounters:  01/13/23 134/80  01/05/23 130/74  09/18/22 122/68   Wt Readings from Last 3 Encounters:  01/13/23 160 lb 15 oz (73 kg)  01/05/23 160 lb 14.4 oz (73 kg)  09/18/22 159 lb 4.8 oz (72.3 kg)      Physical Exam Constitutional:      Appearance: Normal appearance.  HENT:     Head: Normocephalic and atraumatic.  Eyes:     Conjunctiva/sclera: Conjunctivae normal.  Cardiovascular:     Rate and Rhythm: Normal rate and regular rhythm.     Pulses:          Dorsalis pedis pulses are 2+ on the right side and 2+ on the left side.  Pulmonary:     Effort: Pulmonary effort is normal.     Breath sounds: Normal breath sounds.  Musculoskeletal:     Right lower leg: No edema.     Left lower leg: No edema.     Right foot: Normal range of motion. No deformity, bunion, Charcot foot, foot drop or prominent metatarsal heads.     Left foot: Normal range of motion. No deformity, bunion, Charcot foot, foot drop or prominent metatarsal heads.  Feet:     Right foot:     Protective Sensation: 6 sites tested.  6 sites sensed.     Skin integrity: Skin integrity normal.     Toenail Condition: Right toenails are normal.     Left foot:     Protective Sensation: 6 sites  tested.  6 sites sensed.     Skin integrity: Skin integrity normal.     Toenail Condition: Left toenails are normal.  Skin:    General: Skin is warm and dry.  Neurological:     General: No focal deficit present.     Mental Status: She is alert. Mental status is at baseline.  Psychiatric:        Mood and Affect: Mood normal.        Behavior: Behavior normal.    No results found for any visits on 03/19/23.  Last CBC Lab Results  Component Value Date   WBC 17.4 (H) 01/13/2023   HGB 13.2 01/13/2023   HCT 42.1 01/13/2023   MCV 81.7 01/13/2023   MCH 25.6 (L) 01/13/2023   RDW 14.8 01/13/2023   PLT 304 01/13/2023   Last metabolic panel Lab Results  Component Value Date   GLUCOSE 114 (H) 01/13/2023   NA 137 01/13/2023   K 4.5 01/13/2023   CL 103 01/13/2023   CO2 24 01/13/2023   BUN 16 01/13/2023   CREATININE 0.57 01/13/2023   EGFR 99 09/18/2022   CALCIUM 9.3 01/13/2023   PROT 7.5 09/18/2022   ALBUMIN 3.6 05/31/2019   LABGLOB 3.4 09/19/2015   AGRATIO 1.2 09/19/2015   BILITOT 0.3 09/18/2022   ALKPHOS 89 05/31/2019   AST 17 09/18/2022   ALT 13 09/18/2022   ANIONGAP 10 01/13/2023   Last lipids Lab Results  Component Value Date   CHOL 137 09/18/2022   HDL 50 09/18/2022   LDLCALC 69 09/18/2022   TRIG 93 09/18/2022   CHOLHDL 2.7 09/18/2022   Last hemoglobin A1c Lab Results  Component Value Date   HGBA1C 6.1 (H)  09/18/2022   Last thyroid functions Lab Results  Component Value Date   TSH 2.55 08/18/2017   Last vitamin D No results found for: "25OHVITD2", "25OHVITD3", "VD25OH" Last vitamin B12 and Folate Lab Results  Component Value Date   VITAMINB12 399 08/18/2017      The 10-year ASCVD risk score (Arnett DK, et al., 2019) is: 12.4%    Assessment & Plan:   1. Type 2 diabetes mellitus without complication, without long-term current use of insulin (HCC): Controlled. Recheck A1c, CMP, CBC and microalbumin today. Foot exam today. Continue Metformin 500 mg XR  BID. Follow up in 6 months.  - CBC w/Diff/Platelet - COMPLETE METABOLIC PANEL WITH GFR - HgB Z6X - Urine Microalbumin w/creat. ratio - HM Diabetes Foot Exam  2. Hyperlipidemia, unspecified hyperlipidemia type: Check lipid panel today. Continue Crestor 5 mg daily, refilled.  - Lipid Profile - rosuvastatin (CRESTOR) 5 MG tablet; Take 1 tablet (5 mg total) by mouth daily.  Dispense: 90 tablet; Refill: 1  3. Need for influenza vaccination: Flu vaccine today.   - Flu Vaccine QUAD High Dose(Fluad)  No follow-ups on file.    Margarita Mail, DO

## 2023-03-19 ENCOUNTER — Ambulatory Visit: Payer: 59 | Admitting: Internal Medicine

## 2023-04-06 ENCOUNTER — Other Ambulatory Visit: Payer: Self-pay | Admitting: Internal Medicine

## 2023-04-06 DIAGNOSIS — E785 Hyperlipidemia, unspecified: Secondary | ICD-10-CM

## 2023-04-07 NOTE — Telephone Encounter (Signed)
Last labs 09/18/22 Requested Prescriptions  Pending Prescriptions Disp Refills   rosuvastatin (CRESTOR) 5 MG tablet [Pharmacy Med Name: ROSUVASTATIN CALCIUM 5 MG TAB] 90 tablet 1    Sig: TAKE 1 TABLET (5 MG TOTAL) BY MOUTH DAILY.     Cardiovascular:  Antilipid - Statins 2 Failed - 04/06/2023  2:19 AM      Failed - Lipid Panel in normal range within the last 12 months    Cholesterol, Total  Date Value Ref Range Status  09/19/2015 178 100 - 199 mg/dL Final   Cholesterol  Date Value Ref Range Status  09/18/2022 137 <200 mg/dL Final   LDL Cholesterol (Calc)  Date Value Ref Range Status  09/18/2022 69 mg/dL (calc) Final    Comment:    Reference range: <100 . Desirable range <100 mg/dL for primary prevention;   <70 mg/dL for patients with CHD or diabetic patients  with > or = 2 CHD risk factors. Marland Kitchen LDL-C is now calculated using the Martin-Hopkins  calculation, which is a validated novel method providing  better accuracy than the Friedewald equation in the  estimation of LDL-C.  Horald Pollen et al. Lenox Ahr. 2536;644(03): 2061-2068  (http://education.[facility].com/faq/FAQ164)    HDL  Date Value Ref Range Status  09/18/2022 50 > OR = 50 mg/dL Final  47/42/5956 47 >38 mg/dL Final    Comment:    According to ATP-III Guidelines, HDL-C >59 mg/dL is considered a negative risk factor for CHD.    Triglycerides  Date Value Ref Range Status  09/18/2022 93 <150 mg/dL Final         Passed - Cr in normal range and within 360 days    Creat  Date Value Ref Range Status  09/18/2022 0.59 0.50 - 1.05 mg/dL Final   Creatinine, Ser  Date Value Ref Range Status  01/13/2023 0.57 0.44 - 1.00 mg/dL Final   Creatinine, Urine  Date Value Ref Range Status  09/18/2022 14 (L) 20 - 275 mg/dL Final         Passed - Patient is not pregnant      Passed - Valid encounter within last 12 months    Recent Outpatient Visits           3 months ago Left forearm pain   [place] [facility] Danelle Berry, PA-C   6 months ago Type 2 diabetes mellitus without complication, without long-term current use of insulin [facility])   [place] [facility] Margarita Mail, DO   7 months ago Vertigo   [facility] Danelle Berry, PA-C   1 year ago Prediabetes   [facility] Danelle Berry, PA-C   1 year ago Need for influenza vaccination   [facility] Caro [place], [place]

## 2023-06-02 NOTE — Progress Notes (Signed)
   Acute Office Visit  Subjective:     Patient ID: Kristine Alexander, female    DOB: 1956-10-29, 67 y.o.   MRN: [id number]  Chief Complaint  Patient presents with   leg cramp    HPI Patient is in today for leg cramps.  Patient is here with her daughter.  States over the last several weeks she will wake up in the middle of the night with bilateral calf cramping to the point where she cannot straighten out her leg.  This does not happen every night but happens about 3 times a week.  Does not happen during the day only at night.  She will wake up in severe pain, will have to stretch and walk for the cramps to relieve.  She does drink a lot of water, juice.  She does not take a multivitamin.  Patient denies skin changes.  She denies any other muscular pain or joint pain.  Review of Systems  All other systems reviewed and are negative.       Objective:    BP 120/78   Pulse 84   Temp 98.3 F (36.8 C)   Resp 18   Ht 5' (1.524 m)   Wt 152 lb 14.4 oz (69.4 kg)   SpO2 97%   BMI 29.86 kg/m    Physical Exam Constitutional:      Appearance: Normal appearance.  HENT:     Head: Normocephalic and atraumatic.  Eyes:     Conjunctiva/sclera: Conjunctivae normal.  Cardiovascular:     Rate and Rhythm: Normal rate and regular rhythm.  Pulmonary:     Effort: Pulmonary effort is normal.     Breath sounds: Normal breath sounds.  Musculoskeletal:     Right lower leg: No edema.     Left lower leg: No edema.  Skin:    General: Skin is warm and dry.  Neurological:     General: No focal deficit present.     Mental Status: She is alert. Mental status is at baseline.  Psychiatric:        Mood and Affect: Mood normal.        Behavior: Behavior normal.     No results found for any visits on 06/04/23.      Assessment & Plan:   1. Bilateral leg cramps/Muscle spasm of both lower legs: Sounds more like true muscular cramping/spasms versus restless leg.  Will rule out vitamin deficiencies,  thyroid disease, recheck A1c in electrolytes.  Recommend she start taking a once a day women's multivitamin.  Continue to increase hydration.  If labs come back normal will prescribe low-dose muscle relaxer to take at bedtime as needed.  - COMPLETE METABOLIC PANEL WITH GFR - HgB X9J - TSH - B12 and Folate Panel - Vitamin B1 - Vitamin D (25 hydroxy) - Magnesium   Return if symptoms worsen or fail to improve.  Margarita Mail, DO

## 2023-06-04 ENCOUNTER — Ambulatory Visit (INDEPENDENT_AMBULATORY_CARE_PROVIDER_SITE_OTHER): Payer: 59 | Admitting: Internal Medicine

## 2023-06-04 ENCOUNTER — Encounter: Payer: Self-pay | Admitting: Internal Medicine

## 2023-06-04 VITALS — BP 120/78 | HR 84 | Temp 98.3°F | Resp 18 | Ht 60.0 in | Wt 152.9 lb

## 2023-06-04 DIAGNOSIS — R252 Cramp and spasm: Secondary | ICD-10-CM

## 2023-06-04 DIAGNOSIS — M62838 Other muscle spasm: Secondary | ICD-10-CM | POA: Diagnosis not present

## 2023-06-04 DIAGNOSIS — R5383 Other fatigue: Secondary | ICD-10-CM | POA: Diagnosis not present

## 2023-06-05 ENCOUNTER — Telehealth: Payer: Self-pay | Admitting: Internal Medicine

## 2023-06-05 LAB — COMPLETE METABOLIC PANEL WITH GFR
AG Ratio: 1.2 (calc) (ref 1.0–2.5)
ALT: 14 U/L (ref 6–29)
Albumin: 4.1 g/dL (ref 3.6–5.1)
BUN: 10 mg/dL (ref 7–25)
Calcium: 9.6 mg/dL (ref 8.6–10.4)
Creat: 0.65 mg/dL (ref 0.50–1.05)
Total Bilirubin: 0.4 mg/dL (ref 0.2–1.2)

## 2023-06-05 LAB — VITAMIN D 25 HYDROXY (VIT D DEFICIENCY, FRACTURES): Vit D, 25-Hydroxy: 39 ng/mL (ref 30–100)

## 2023-06-05 LAB — HEMOGLOBIN A1C: eAG (mmol/L): 7 mmol/L

## 2023-06-05 LAB — B12 AND FOLATE PANEL
Folate: 15.1 ng/mL
Vitamin B-12: 411 pg/mL (ref 200–1100)

## 2023-06-05 NOTE — Telephone Encounter (Signed)
Pt called in for lab results,no notes in system. Please call back when available

## 2023-06-08 LAB — COMPLETE METABOLIC PANEL WITH GFR
AST: 19 U/L (ref 10–35)
Alkaline phosphatase (APISO): 52 U/L (ref 37–153)
CO2: 23 mmol/L (ref 20–32)
Chloride: 105 mmol/L (ref 98–110)
Globulin: 3.3 g/dL (calc) (ref 1.9–3.7)
Glucose, Bld: 100 mg/dL — ABNORMAL HIGH (ref 65–99)
Potassium: 4 mmol/L (ref 3.5–5.3)
Sodium: 138 mmol/L (ref 135–146)
Total Protein: 7.4 g/dL (ref 6.1–8.1)
eGFR: 96 mL/min/{1.73_m2} (ref >=60)

## 2023-06-08 LAB — TSH: TSH: 3.96 mIU/L (ref 0.40–4.50)

## 2023-06-08 LAB — VITAMIN B1: Vitamin B1 (Thiamine): 14 nmol/L (ref 8–30)

## 2023-06-08 LAB — HEMOGLOBIN A1C
Hgb A1c MFr Bld: 6 % of total Hgb — ABNORMAL HIGH (ref <5.7)
Mean Plasma Glucose: 126 mg/dL

## 2023-06-08 LAB — MAGNESIUM: Magnesium: 1.8 mg/dL (ref 1.5–2.5)

## 2023-07-23 ENCOUNTER — Ambulatory Visit: Payer: Self-pay | Admitting: *Deleted

## 2023-07-23 NOTE — Telephone Encounter (Signed)
  Chief Complaint: Dizziness, coughing, fatigue and fever.   Has a history of vertigo.   Meclizine in the past did not help. Symptoms: above Frequency: For the last couple of days Pertinent Negatives: Patient denies sore throat or nasal congestion.   Has not tested for Covid.   Encouraged to test.  Agreeable to doing a home test for Covid. Disposition: [] ED /[] Urgent Care (no appt availability in office) / [x] Appointment(In office/virtual)/ []  [place] [facility]/ [] Home Care/ [] Refused Recommended Disposition /[] [facility]/ []  Follow-up with PCP Additional Notes: Appt made with Dr. Caralee Ates for 07/24/2023 at 11:20.

## 2023-07-23 NOTE — Telephone Encounter (Signed)
Reason for Disposition  [1] MODERATE dizziness (e.g., interferes with normal activities) AND [2] has NOT been evaluated by doctor (or NP/PA) for this  (Exception: Dizziness caused by heat exposure, sudden standing, or poor fluid intake.)  Answer Assessment - Initial Assessment Questions 1. DESCRIPTION: "Describe your dizziness."     Daughter calling in, Manjit.     Mother having dizziness for a couple of days.   She's having coughing some.  She has a fever and is tired.   No one else in house is sick.   2. LIGHTHEADED: "Do you feel lightheaded?" (e.g., somewhat faint, woozy, weak upon standing)     She has had vertigo. 3. VERTIGO: "Do you feel like either you or the room is spinning or tilting?" (i.e. vertigo)     Has vertigo diagnoses from the past.     4. SEVERITY: "How bad is it?"  "Do you feel like you are going to faint?" "Can you stand and walk?"   - MILD: Feels slightly dizzy, but walking normally.   - MODERATE: Feels unsteady when walking, but not falling; interferes with normal activities (e.g., school, work).   - SEVERE: Unable to walk without falling, or requires assistance to walk without falling; feels like passing out now.      Mild     5. ONSET:  "When did the dizziness begin?"     A couple days ago. 6. AGGRAVATING FACTORS: "Does anything make it worse?" (e.g., standing, change in head position)     No  7. HEART RATE: "Can you tell me your heart rate?" "How many beats in 15 seconds?"  (Note: not all patients can do this)       Not asked 8. CAUSE: "What do you think is causing the dizziness?"     She has had vertigo 9. RECURRENT SYMPTOM: "Have you had dizziness before?" If Yes, ask: "When was the last time?" "What happened that time?"     Yes vertigo 10. OTHER SYMPTOMS: "Do you have any other symptoms?" (e.g., fever, chest pain, vomiting, diarrhea, bleeding)       See above 11. PREGNANCY: "Is there any chance you are pregnant?" "When was your last menstrual period?"        N/A due to age  Protocols used: Dizziness - Lightheadedness-A-AH

## 2023-07-23 NOTE — Progress Notes (Signed)
Acute Office Visit  Subjective:     Patient ID: Kristine Alexander, female    DOB: [birth year], 67 y.o.   MRN: [id number]  Chief Complaint  Patient presents with   Dizziness    1 week, headache, dry cough    HPI Patient is in today for dizziness. Patient is here with her daughter-in-law today.  Also complaining of dry cough for 1 week, no other respiratory symptoms.   DIZZINESS Duration: weeks Description of symptoms: room spinning Duration of episode:  seconds to minutes  Provoking factors:  uncertain Triggered by rolling over in bed: no Triggered by bending over: no Aggravated by head movement: no Aggravated by exertion, coughing, loud noises: no Recent or current viral symptoms: yes History of vasovagal episodes: no Nausea: no Vomiting: no Tinnitus: yes Hearing loss: no Aural fullness: no Headache: yes    Review of Systems  Constitutional:  Negative for chills and fever.  HENT:  Positive for tinnitus. Negative for congestion, ear discharge, ear pain, hearing loss and sinus pain.   Respiratory:  Positive for cough. Negative for sputum production, shortness of breath and wheezing.   Cardiovascular:  Negative for chest pain.  Neurological:  Positive for dizziness. Negative for headaches.        Objective:    BP 122/78   Pulse 77   Temp 97.8 F (36.6 C)   Ht 5' (1.524 m)   Wt 153 lb 12.8 oz (69.8 kg)   SpO2 98%   BMI 30.04 kg/m  BP Readings from Last 3 Encounters:  07/24/23 122/78  06/04/23 120/78  01/13/23 134/80   Wt Readings from Last 3 Encounters:  07/24/23 153 lb 12.8 oz (69.8 kg)  06/04/23 152 lb 14.4 oz (69.4 kg)  01/13/23 160 lb 15 oz (73 kg)      Physical Exam Constitutional:      Appearance: Normal appearance.  HENT:     Head: Normocephalic and atraumatic.     Right Ear: Ear canal and external ear normal.     Left Ear: Ear canal and external ear normal.     Ears:     Comments: Bilateral eustachian tube dysfunction    Nose: Nose  normal.     Mouth/Throat:     Mouth: Mucous membranes are moist.     Comments: Mild PND Eyes:     Conjunctiva/sclera: Conjunctivae normal.  Cardiovascular:     Rate and Rhythm: Normal rate and regular rhythm.  Pulmonary:     Effort: Pulmonary effort is normal.     Breath sounds: Normal breath sounds. No wheezing, rhonchi or rales.  Skin:    General: Skin is warm and dry.  Neurological:     General: No focal deficit present.     Mental Status: She is alert. Mental status is at baseline.  Psychiatric:        Mood and Affect: Mood normal.        Behavior: Behavior normal.     No results found for any visits on 07/24/23.      Assessment & Plan:   1. Dysfunction of both eustachian tubes/Vertigo: Symptoms appear to be related to allergies, sinus drainage and sinus pressure. Will treat with nasal steroid and oral anti-histamine.  - fluticasone (FLONASE) 50 MCG/ACT nasal spray; Place 2 sprays into both nostrils daily.  Dispense: 16 g; Refill: 1 - loratadine (CLARITIN) 10 MG tablet; Take 1 tablet (10 mg total) by mouth daily.  Dispense: 30 tablet; Refill: 11  2. Need for influenza  vaccination: Flu vaccine administered today.   - Flu Vaccine Trivalent High Dose (Fluad)   Return in about 3 months (around 10/23/2023) for CPE.  Margarita Mail, DO

## 2023-07-24 ENCOUNTER — Ambulatory Visit (INDEPENDENT_AMBULATORY_CARE_PROVIDER_SITE_OTHER): Payer: 59 | Admitting: Internal Medicine

## 2023-07-24 ENCOUNTER — Other Ambulatory Visit: Payer: Self-pay | Admitting: Internal Medicine

## 2023-07-24 ENCOUNTER — Encounter: Payer: Self-pay | Admitting: Internal Medicine

## 2023-07-24 VITALS — BP 122/78 | HR 77 | Temp 97.8°F | Ht 60.0 in | Wt 153.8 lb

## 2023-07-24 DIAGNOSIS — H6993 Unspecified Eustachian tube disorder, bilateral: Secondary | ICD-10-CM

## 2023-07-24 DIAGNOSIS — R42 Dizziness and giddiness: Secondary | ICD-10-CM

## 2023-07-24 DIAGNOSIS — Z23 Encounter for immunization: Secondary | ICD-10-CM

## 2023-07-24 MED ORDER — LORATADINE 10 MG PO TABS
10.0000 mg | ORAL_TABLET | Freq: Every day | ORAL | 11 refills | Status: DC
Start: 2023-07-24 — End: 2024-02-29

## 2023-07-24 MED ORDER — FLUTICASONE PROPIONATE 50 MCG/ACT NA SUSP
2.0000 | Freq: Every day | NASAL | 1 refills | Status: DC
Start: 2023-07-24 — End: 2023-09-24

## 2023-07-27 NOTE — Telephone Encounter (Signed)
Request is too soon, last refill 07/24/23.  Requested Prescriptions  Pending Prescriptions Disp Refills   fluticasone (FLONASE) 50 MCG/ACT nasal spray [Pharmacy Med Name: FLUTICASONE PROP 50 MCG SPRAY] 0 mL 0    Sig: SPRAY 2 SPRAYS INTO EACH NOSTRIL EVERY DAY     Ear, Nose, and Throat: Nasal Preparations - Corticosteroids Passed - 07/24/2023  1:25 PM      Passed - Valid encounter within last 12 months    Recent Outpatient Visits           3 days ago Dysfunction of both eustachian tubes   [facility] Margarita Mail, DO   1 month ago Bilateral leg cramps   [facility] Margarita Mail, DO   6 months ago Left forearm pain   [facility] Danelle Berry, PA-C   10 months ago Type 2 diabetes mellitus without complication, without long-term current use of insulin [facility])   [facility] Margarita Mail, DO   11 months ago Vertigo   [facility] Danelle Berry, [place]

## 2023-08-16 ENCOUNTER — Other Ambulatory Visit: Payer: Self-pay | Admitting: Family Medicine

## 2023-08-16 DIAGNOSIS — R7303 Prediabetes: Secondary | ICD-10-CM

## 2023-09-23 ENCOUNTER — Other Ambulatory Visit: Payer: Self-pay | Admitting: Internal Medicine

## 2023-09-23 DIAGNOSIS — R42 Dizziness and giddiness: Secondary | ICD-10-CM

## 2023-09-23 DIAGNOSIS — H6993 Unspecified Eustachian tube disorder, bilateral: Secondary | ICD-10-CM

## 2023-09-24 NOTE — Telephone Encounter (Signed)
Requested Prescriptions  Pending Prescriptions Disp Refills   fluticasone (FLONASE) 50 MCG/ACT nasal spray [Pharmacy Med Name: FLUTICASONE PROP 50 MCG SPRAY] 16 mL 1    Sig: SPRAY 2 SPRAYS INTO EACH NOSTRIL EVERY DAY     Ear, Nose, and Throat: Nasal Preparations - Corticosteroids Passed - 09/23/2023  2:16 AM      Passed - Valid encounter within last 12 months    Recent Outpatient Visits           2 months ago Dysfunction of both eustachian tubes   [facility] Margarita Mail, DO   3 months ago Bilateral leg cramps   [facility] Margarita Mail, DO   8 months ago Left forearm pain   [facility] Danelle Berry, PA-C   1 year ago Type 2 diabetes mellitus without complication, without long-term current use of insulin [facility])   [place] [facility] Margarita Mail, DO   1 year ago Vertigo   [facility] Danelle Berry, [place]

## 2023-11-13 DIAGNOSIS — E119 Type 2 diabetes mellitus without complications: Secondary | ICD-10-CM | POA: Diagnosis not present

## 2023-11-13 LAB — HM DIABETES EYE EXAM

## 2023-11-19 ENCOUNTER — Ambulatory Visit (INDEPENDENT_AMBULATORY_CARE_PROVIDER_SITE_OTHER): Payer: 59 | Admitting: Internal Medicine

## 2023-11-19 ENCOUNTER — Encounter: Payer: Self-pay | Admitting: Internal Medicine

## 2023-11-19 ENCOUNTER — Other Ambulatory Visit: Payer: Self-pay

## 2023-11-19 VITALS — BP 122/70 | HR 94 | Temp 98.0°F | Resp 16 | Ht 60.0 in | Wt 147.5 lb

## 2023-11-19 DIAGNOSIS — K529 Noninfective gastroenteritis and colitis, unspecified: Secondary | ICD-10-CM

## 2023-11-19 DIAGNOSIS — R112 Nausea with vomiting, unspecified: Secondary | ICD-10-CM | POA: Diagnosis not present

## 2023-11-19 DIAGNOSIS — Z13820 Encounter for screening for osteoporosis: Secondary | ICD-10-CM

## 2023-11-19 DIAGNOSIS — E2839 Other primary ovarian failure: Secondary | ICD-10-CM | POA: Diagnosis not present

## 2023-11-19 MED ORDER — ONDANSETRON 4 MG PO TBDP
4.0000 mg | ORAL_TABLET | Freq: Three times a day (TID) | ORAL | 0 refills | Status: DC | PRN
Start: 2023-11-19 — End: 2024-02-03

## 2023-11-19 NOTE — Progress Notes (Signed)
   Acute Office Visit  Subjective:     Patient ID: Kristine Alexander, female    DOB: [birth year], 68 y.o.   MRN: [id number]  Chief Complaint  Patient presents with   Dizziness    For 3 days   Diarrhea    HPI Patient is in today for dizziness, nausea, diarrhea, fever and abdominal pain for 3 days. Vomited 3 times today. Cramping abdominal pain all over. Diarrhea 3 times today. Appetite decreased, has not been able to keep down food. Temperature of 100 yesterday but improving today. Overall symptoms are slowly improving. No one else sick in the home.   Review of Systems  Constitutional:  Positive for fever. Negative for chills.  Respiratory:  Negative for shortness of breath.   Cardiovascular:  Negative for chest pain.  Gastrointestinal:  Positive for abdominal pain, diarrhea, nausea and vomiting. Negative for blood in stool and melena.  Genitourinary:  Negative for dysuria, frequency and urgency.  Neurological:  Positive for dizziness.        Objective:    BP 122/70 (Patient Position: Sitting, Cuff Size: Normal)   Pulse 94   Temp 98 F (36.7 C) (Oral)   Resp 16   Ht 5' (1.524 m)   Wt 147 lb 8 oz (66.9 kg)   SpO2 99%   BMI 28.81 kg/m  BP Readings from Last 3 Encounters:  11/19/23 122/70  07/24/23 122/78  06/04/23 120/78   Wt Readings from Last 3 Encounters:  11/19/23 147 lb 8 oz (66.9 kg)  07/24/23 153 lb 12.8 oz (69.8 kg)  06/04/23 152 lb 14.4 oz (69.4 kg)      Physical Exam Constitutional:      Appearance: Normal appearance.  HENT:     Head: Normocephalic and atraumatic.     Mouth/Throat:     Mouth: Mucous membranes are moist.  Eyes:     Conjunctiva/sclera: Conjunctivae normal.  Cardiovascular:     Rate and Rhythm: Normal rate and regular rhythm.  Pulmonary:     Effort: Pulmonary effort is normal.     Breath sounds: Normal breath sounds.  Abdominal:     General: There is no distension.     Palpations: Abdomen is soft.     Tenderness: There is no  abdominal tenderness.  Skin:    General: Skin is warm and dry.  Neurological:     General: No focal deficit present.     Mental Status: She is alert. Mental status is at baseline.  Psychiatric:        Mood and Affect: Mood normal.        Behavior: Behavior normal.     No results found for any visits on 11/19/23.      Assessment & Plan:   1. Gastroenteritis (Primary)/ Nausea and vomiting, unspecified vomiting type: Patient starting to be able to keep down liquids but not food yet. Does not want Phenergan  injection in the office today, will prescribe Zofran  PRN. Discussed staying well hydrated and advancing diet as tolerated with bland foods. Return if symptoms do not improve.   - ondansetron  (ZOFRAN -ODT) 4 MG disintegrating tablet; Take 1 tablet (4 mg total) by mouth every 8 (eight) hours as needed for nausea or vomiting.  Dispense: 20 tablet; Refill: 0  2. Osteoporosis screening/Estrogen deficiency: Will order DEXA scan at patient's request but she refuses mammogram.  - DG Bone Density; Future  Return if symptoms worsen or fail to improve.  Sharyle Fischer, DO

## 2023-12-01 ENCOUNTER — Other Ambulatory Visit: Payer: Self-pay | Admitting: Internal Medicine

## 2023-12-01 DIAGNOSIS — E785 Hyperlipidemia, unspecified: Secondary | ICD-10-CM

## 2023-12-02 NOTE — Telephone Encounter (Signed)
 Requested Prescriptions  Pending Prescriptions Disp Refills   rosuvastatin  (CRESTOR ) 5 MG tablet [Pharmacy Med Name: ROSUVASTATIN  CALCIUM  5 MG TAB] 90 tablet 0    Sig: TAKE 1 TABLET (5 MG TOTAL) BY MOUTH DAILY.     Cardiovascular:  Antilipid - Statins 2 Failed - 12/02/2023  8:28 AM      Failed - Lipid Panel in normal range within the last 12 months    Cholesterol, Total  Date Value Ref Range Status  09/19/2015 178 100 - 199 mg/dL Final   Cholesterol  Date Value Ref Range Status  09/18/2022 137 <200 mg/dL Final   LDL Cholesterol (Calc)  Date Value Ref Range Status  09/18/2022 69 mg/dL (calc) Final    Comment:    Reference range: <100 . Desirable range <100 mg/dL for primary prevention;   <70 mg/dL for patients with CHD or diabetic patients  with > or = 2 CHD risk factors. Aaron Aas LDL-C is now calculated using the Martin-Hopkins  calculation, which is a validated novel method providing  better accuracy than the Friedewald equation in the  estimation of LDL-C.  Melinda Sprawls et al. Erroll Heard. 1610;960(45): 2061-2068  (http://education.[facility].com/faq/FAQ164)    HDL  Date Value Ref Range Status  09/18/2022 50 > OR = 50 mg/dL Final  40/98/1191 47 >47 mg/dL Final    Comment:    According to ATP-III Guidelines, HDL-C >59 mg/dL is considered a negative risk factor for CHD.    Triglycerides  Date Value Ref Range Status  09/18/2022 93 <150 mg/dL Final         Passed - Cr in normal range and within 360 days    Creat  Date Value Ref Range Status  06/04/2023 0.65 0.50 - 1.05 mg/dL Final   Creatinine, Urine  Date Value Ref Range Status  09/18/2022 14 (L) 20 - 275 mg/dL Final         Passed - Patient is not pregnant      Passed - Valid encounter within last 12 months    Recent Outpatient Visits           1 week ago Gastroenteritis   [facility] Rockney Cid, DO   4 months ago Dysfunction of both eustachian tubes   [facility] Rockney Cid, DO   6 months ago Bilateral leg cramps   [facility] Rockney Cid, [place]   11 months ago Left forearm pain   [facility] [place] [facility] Adeline Hone, PA-C   1 year ago Type 2 diabetes mellitus without complication, without long-term current use of insulin [facility])   [facility] Rockney Cid, [place]

## 2024-01-06 ENCOUNTER — Other Ambulatory Visit: Payer: Self-pay | Admitting: Internal Medicine

## 2024-01-06 DIAGNOSIS — R7303 Prediabetes: Secondary | ICD-10-CM

## 2024-01-07 NOTE — Telephone Encounter (Signed)
Requested Prescriptions  Pending Prescriptions Disp Refills   metFORMIN (GLUCOPHAGE-XR) 500 MG 24 hr tablet [Pharmacy Med Name: METFORMIN HCL ER 500 MG TABLET] 180 tablet 0    Sig: TAKE 2 TABLETS(1000 MG) BY MOUTH DAILY     Endocrinology:  Diabetes - Biguanides Failed - 01/07/2024  9:46 AM      Failed - HBA1C is between 0 and 7.9 and within 180 days    Hgb A1c MFr Bld  Date Value Ref Range Status  06/04/2023 6.0 (H) <5.7 % of total Hgb Final    Comment:    For someone without known diabetes, a hemoglobin  A1c value between 5.7% and 6.4% is consistent with prediabetes and should be confirmed with a  follow-up test. . For someone with known diabetes, a value <7% indicates that their diabetes is well controlled. A1c targets should be individualized based on duration of diabetes, age, comorbid conditions, and other considerations. . This assay result is consistent with an increased risk of diabetes. . Currently, no consensus exists regarding use of hemoglobin A1c for diagnosis of diabetes for children. .          Failed - CBC within normal limits and completed in the last 12 months    WBC  Date Value Ref Range Status  01/13/2023 17.4 (H) 4.0 - 10.5 K/uL Final   RBC  Date Value Ref Range Status  01/13/2023 5.15 (H) 3.87 - 5.11 MIL/uL Final   Hemoglobin  Date Value Ref Range Status  01/13/2023 13.2 12.0 - 15.0 g/dL Final  84/69/6295 28.4 11.1 - 15.9 g/dL Final   HCT  Date Value Ref Range Status  01/13/2023 42.1 36.0 - 46.0 % Final   Hematocrit  Date Value Ref Range Status  12/26/2015 34.9 34.0 - 46.6 % Final   MCHC  Date Value Ref Range Status  01/13/2023 31.4 30.0 - 36.0 g/dL Final   [facility]  Date Value Ref Range Status  01/13/2023 25.6 (L) 26.0 - 34.0 pg Final   MCV  Date Value Ref Range Status  01/13/2023 81.7 80.0 - 100.0 fL Final  12/26/2015 77 (L) 79 - 97 fL Final   No results found for: "PLTCOUNTKUC", "LABPLAT", "POCPLA" RDW  Date Value Ref Range  Status  01/13/2023 14.8 11.5 - 15.5 % Final  12/26/2015 14.7 12.3 - 15.4 % Final         Passed - Cr in normal range and within 360 days    Creat  Date Value Ref Range Status  06/04/2023 0.65 0.50 - 1.05 mg/dL Final   Creatinine, Urine  Date Value Ref Range Status  09/18/2022 14 (L) 20 - 275 mg/dL Final         Passed - eGFR in normal range and within 360 days    GFR, Est African American  Date Value Ref Range Status  03/12/2021 108 > OR = 60 mL/min/1.56m2 Final   GFR, Est Non African American  Date Value Ref Range Status  03/12/2021 93 > OR = 60 mL/min/1.33m2 Final   GFR, Estimated  Date Value Ref Range Status  01/13/2023 >60 >60 mL/min Final    Comment:    (NOTE) Calculated using the CKD-EPI Creatinine Equation (2021)    eGFR  Date Value Ref Range Status  06/04/2023 96 > OR = 60 mL/min/1.34m2 Final         Passed - B12 Level in normal range and within 720 days    Vitamin B-12  Date Value Ref Range Status  06/04/2023  411 200 - 1,100 pg/mL Final         Passed - Valid encounter within last 6 months    Recent Outpatient Visits           1 month ago Gastroenteritis   [facility] Margarita Mail, DO   5 months ago Dysfunction of both eustachian tubes   [facility] Margarita Mail, DO   7 months ago Bilateral leg cramps   [facility] Margarita Mail, DO   1 year ago Left forearm pain   [facility] Danelle Berry, PA-C   1 year ago Type 2 diabetes mellitus without complication, without long-term current use of insulin [facility])   [facility] Margarita Mail, [place]

## 2024-01-11 LAB — HM DIABETES EYE EXAM

## 2024-01-20 ENCOUNTER — Encounter: Payer: 59 | Admitting: Internal Medicine

## 2024-01-20 ENCOUNTER — Encounter: Payer: Self-pay | Admitting: Internal Medicine

## 2024-02-03 ENCOUNTER — Encounter: Payer: Self-pay | Admitting: Internal Medicine

## 2024-02-03 ENCOUNTER — Ambulatory Visit (INDEPENDENT_AMBULATORY_CARE_PROVIDER_SITE_OTHER): Admitting: Internal Medicine

## 2024-02-03 ENCOUNTER — Other Ambulatory Visit: Payer: Self-pay

## 2024-02-03 VITALS — BP 124/82 | HR 97 | Temp 97.9°F | Resp 16 | Ht 60.0 in | Wt 144.7 lb

## 2024-02-03 DIAGNOSIS — E785 Hyperlipidemia, unspecified: Secondary | ICD-10-CM

## 2024-02-03 DIAGNOSIS — E559 Vitamin D deficiency, unspecified: Secondary | ICD-10-CM

## 2024-02-03 DIAGNOSIS — E2839 Other primary ovarian failure: Secondary | ICD-10-CM | POA: Diagnosis not present

## 2024-02-03 DIAGNOSIS — Z0000 Encounter for general adult medical examination without abnormal findings: Secondary | ICD-10-CM | POA: Diagnosis not present

## 2024-02-03 DIAGNOSIS — D696 Thrombocytopenia, unspecified: Secondary | ICD-10-CM

## 2024-02-03 DIAGNOSIS — Z13820 Encounter for screening for osteoporosis: Secondary | ICD-10-CM

## 2024-02-03 DIAGNOSIS — R7303 Prediabetes: Secondary | ICD-10-CM

## 2024-02-03 MED ORDER — METFORMIN HCL ER 500 MG PO TB24: ORAL_TABLET | ORAL | 1 refills | Status: DC

## 2024-02-03 MED ORDER — ROSUVASTATIN CALCIUM 5 MG PO TABS: 5.0000 mg | ORAL_TABLET | Freq: Every day | ORAL | 0 refills | Status: DC

## 2024-02-03 NOTE — Progress Notes (Signed)
 Name: Kristine Alexander   MRN: [id number]    DOB: 03-04-1956   Date:02/03/2024       Progress Note  Subjective  Chief Complaint  Chief Complaint  Patient presents with   Annual Exam    HPI  Patient presents for annual CPE.  Diet: Regular Exercise: 5 days 30 minutes  Last Eye Exam: completed Last Dental Exam: completed  Flowsheet Row Office Visit from 02/03/2024 in [facility]  AUDIT-C Score 0      Depression: Phq 9 is  negative    02/03/2024    9:48 AM 07/24/2023   11:20 AM 06/04/2023   10:00 AM 01/05/2023   10:25 AM 09/18/2022   11:09 AM  Depression screen PHQ 2/9  Decreased Interest 0 0 0 0 0  Down, Depressed, Hopeless 0 0 0 0 0  PHQ - 2 Score 0 0 0 0 0  Altered sleeping  0 0 0 0  Tired, decreased energy  0 0 0 0  Change in appetite  0 0 0 0  Feeling bad or failure about yourself   0 0 0 0  Trouble concentrating  0 0 0 0  Moving slowly or fidgety/restless  0 0 0 0  Suicidal thoughts  0 0 0 0  PHQ-9 Score  0 0 0 0  Difficult doing work/chores  Not difficult at all Not difficult at all Not difficult at all Not difficult at all   Hypertension: BP Readings from Last 3 Encounters:  02/03/24 124/82  11/19/23 122/70  07/24/23 122/78   Obesity: Wt Readings from Last 3 Encounters:  02/03/24 144 lb 11.2 oz (65.6 kg)  11/19/23 147 lb 8 oz (66.9 kg)  07/24/23 153 lb 12.8 oz (69.8 kg)   BMI Readings from Last 3 Encounters:  02/03/24 28.26 kg/m  11/19/23 28.81 kg/m  07/24/23 30.04 kg/m     Vaccines: Shingles reviewed with the patient.   Hep C Screening: completed STD testing and prevention (HIV/chl/gon/syphilis): no concerns Intimate partner violence: negative screen  Menstrual History/LMP/Abnormal Bleeding: None Discussed importance of follow up if any post-menopausal bleeding: no  Incontinence Symptoms: negative for symptoms   Breast cancer:  - Last Mammogram: Refused - patient and family does not want to continue with any cancer  screenings  Osteoporosis Prevention : Discussed high calcium and vitamin D supplementation, weight bearing exercises Bone density : will ordered and schedule   Cervical cancer screening: not applicable due to age, declined  Skin cancer: Discussed monitoring for atypical lesions  Colorectal cancer: Refused   Lung cancer:  Low Dose CT Chest recommended if Age 84-80 years, 20 pack-year currently smoking OR have quit w/in 15years. Patient does not qualify for screen   ECG: 05/31/19  Advanced Care Planning: A voluntary discussion about advance care planning including the explanation and discussion of advance directives.  Discussed health care proxy and Living will, and the patient was able to identify a health care proxy as Kaziyah Parkison (daughter).  Patient does not have a living will and power of attorney of health care   Patient Active Problem List   Diagnosis Date Noted   Mass of upper lobe of lung 02/29/2020   Thoracic aorta atherosclerosis (HCC) 08/17/2019   Hyperlipidemia associated with type 2 diabetes mellitus (HCC) 08/17/2019   Mediastinal lymphadenopathy 08/17/2019   Lung nodule, multiple 08/17/2019   Aortic systolic murmur on examination 08/17/2019   S/P cholecystectomy 08/17/2019   Hepatic steatosis 03/22/2019   Prediabetes 08/20/2017   Anemia  08/20/2017   Vitamin B12 deficiency 08/20/2017   Cardiac murmur 09/18/2015   Class 1 obesity with serious comorbidity and body mass index (BMI) of 33.0 to 33.9 in adult 05/16/2015   Osteoarthrosis, generalized, multiple joints 05/16/2015   Hyperlipidemia 05/16/2015    Past Surgical History:  Procedure Laterality Date   CATARACT EXTRACTION W/PHACO Right 02/25/2016   Procedure: CATARACT EXTRACTION PHACO AND INTRAOCULAR LENS PLACEMENT (IOC);  Surgeon: Sallee Lange, MD;  Location: [facility] ORS;  Service: Ophthalmology;  Laterality: Right;  [place]    00:48.2 AP%   23.5 CDE   22.58 fluid pack lot # [id number]   CATARACT EXTRACTION W/PHACO  Left 06/08/2018   Procedure: CATARACT EXTRACTION PHACO AND INTRAOCULAR LENS PLACEMENT (IOC);  Surgeon: Galen Manila, MD;  Location: [facility] ORS;  Service: Ophthalmology;  Laterality: Left;  [place] 00:28.4 AP% 13.0 CDE 3.68 Fluid pack Lot # [id number]   CHOLECYSTECTOMY N/A 05/19/2019   Procedure: LAPAROSCOPIC CHOLECYSTECTOMY;  Surgeon: Duanne Guess, MD;  Location: [facility] ORS;  Service: General;  Laterality: N/A;   TUBAL LIGATION      Family History  Problem Relation Age of Onset   Hyperlipidemia Mother    Hyperlipidemia Father    Hyperlipidemia Sister    Diabetes Brother    Hyperlipidemia Brother     Social History   Socioeconomic History   Marital status: Married    Spouse name: Not on file   Number of children: Not on file   Years of education: Not on file   Highest education level: Not on file  Occupational History   Not on file  Tobacco Use   Smoking status: Never   Smokeless tobacco: Never  Vaping Use   Vaping status: Never Used  Substance and Sexual Activity   Alcohol use: No    Alcohol/week: 0.0 standard drinks of alcohol   Drug use: No   Sexual activity: Not Currently  Other Topics Concern   Not on file  Social History Narrative   Not on file   Social Drivers of Health   Financial Resource Strain: Low Risk  (02/03/2024)   Overall Financial Resource Strain (CARDIA)    Difficulty of Paying Living Expenses: Not hard at all  Food Insecurity: No Food Insecurity (02/03/2024)   Hunger Vital Sign    Worried About Running Out of Food in the Last Year: Never true    Ran Out of Food in the Last Year: Never true  Transportation Needs: No Transportation Needs (02/03/2024)   PRAPARE - Administrator, Civil Service (Medical): No    Lack of Transportation (Non-Medical): No  Physical Activity: Sufficiently Active (02/03/2024)   Exercise Vital Sign    Days of Exercise per Week: 5 days    Minutes of Exercise per Session: 30 min  Stress: No Stress Concern Present  (02/03/2024)   [employer] of Occupational Health - Occupational Stress Questionnaire    Feeling of Stress : Only a little  Social Connections: Moderately Integrated (02/03/2024)   Social Connection and Isolation Panel [NHANES]    Frequency of Communication with Friends and Family: Once a week    Frequency of Social Gatherings with Friends and Family: Once a week    Attends Religious Services: 1 to 4 times per year    Active Member of [employer] or Organizations: Yes    Attends Banker Meetings: Never    Marital Status: Married  Catering manager Violence: Not At Risk (02/03/2024)   Humiliation, Afraid, Rape, and Kick questionnaire  Fear of Current or Ex-Partner: No    Emotionally Abused: No    Physically Abused: No    Sexually Abused: No     Current Outpatient Medications:    loratadine (CLARITIN) 10 MG tablet, Take 1 tablet (10 mg total) by mouth daily. (Patient not taking: Reported on 02/03/2024), Disp: 30 tablet, Rfl: 11   metFORMIN (GLUCOPHAGE-XR) 500 MG 24 hr tablet, TAKE 2 TABLETS(1000 MG) BY MOUTH DAILY, Disp: 180 tablet, Rfl: 1   rosuvastatin (CRESTOR) 5 MG tablet, Take 1 tablet (5 mg total) by mouth daily., Disp: 90 tablet, Rfl: 0  No Known Allergies   Review of Systems  All other systems reviewed and are negative.   Objective  Vitals:   02/03/24 0956  BP: 124/82  Pulse: 97  Resp: 16  Temp: 97.9 F (36.6 C)  TempSrc: Oral  SpO2: 97%  Weight: 144 lb 11.2 oz (65.6 kg)  Height: 5' (1.524 m)    Body mass index is 28.26 kg/m.  Physical Exam Constitutional:      Appearance: Normal appearance.  HENT:     Head: Normocephalic and atraumatic.     Mouth/Throat:     Mouth: Mucous membranes are moist.     Pharynx: Oropharynx is clear.  Eyes:     Extraocular Movements: Extraocular movements intact.     Conjunctiva/sclera: Conjunctivae normal.     Pupils: Pupils are equal, round, and reactive to light.  Neck:     Comments: No  thyromegaly Cardiovascular:     Rate and Rhythm: Normal rate and regular rhythm.  Pulmonary:     Effort: Pulmonary effort is normal.     Breath sounds: Normal breath sounds.  Musculoskeletal:     Cervical back: No tenderness.     Right lower leg: No edema.     Left lower leg: No edema.  Lymphadenopathy:     Cervical: No cervical adenopathy.  Skin:    General: Skin is warm and dry.  Neurological:     General: No focal deficit present.     Mental Status: She is alert. Mental status is at baseline.  Psychiatric:        Mood and Affect: Mood normal.        Behavior: Behavior normal.     Last CBC Lab Results  Component Value Date   WBC 17.4 (H) 01/13/2023   HGB 13.2 01/13/2023   HCT 42.1 01/13/2023   MCV 81.7 01/13/2023   MCH 25.6 (L) 01/13/2023   RDW 14.8 01/13/2023   PLT 304 01/13/2023   Last metabolic panel Lab Results  Component Value Date   GLUCOSE 100 (H) 06/04/2023   NA 138 06/04/2023   K 4.0 06/04/2023   CL 105 06/04/2023   CO2 23 06/04/2023   BUN 10 06/04/2023   CREATININE 0.65 06/04/2023   EGFR 96 06/04/2023   CALCIUM 9.6 06/04/2023   PROT 7.4 06/04/2023   ALBUMIN 3.6 05/31/2019   LABGLOB 3.4 09/19/2015   AGRATIO 1.2 09/19/2015   BILITOT 0.4 06/04/2023   ALKPHOS 89 05/31/2019   AST 19 06/04/2023   ALT 14 06/04/2023   ANIONGAP 10 01/13/2023   Last lipids Lab Results  Component Value Date   CHOL 137 09/18/2022   HDL 50 09/18/2022   LDLCALC 69 09/18/2022   TRIG 93 09/18/2022   CHOLHDL 2.7 09/18/2022   Last hemoglobin A1c Lab Results  Component Value Date   HGBA1C 6.0 (H) 06/04/2023   Last thyroid functions Lab Results  Component Value Date  TSH 3.96 06/04/2023   Last vitamin D Lab Results  Component Value Date   VD25OH 39 06/04/2023   Last vitamin B12 and Folate Lab Results  Component Value Date   VITAMINB12 411 06/04/2023   FOLATE 15.1 06/04/2023      Assessment & Plan  1. Annual physical exam (Primary): Physical exam  completed, health maintenance reviewed and annual labs ordered.   - CBC w/Diff/Platelet - COMPLETE METABOLIC PANEL WITH GFR - Lipid Profile - HgB A1c - Vitamin D (25 hydroxy) - Urine Microalbumin w/creat. ratio - DG Bone Density; Future  2. Hyperlipidemia, unspecified hyperlipidemia type: Check fasting labs today, refill statin.   - Lipid Profile - rosuvastatin (CRESTOR) 5 MG tablet; Take 1 tablet (5 mg total) by mouth daily.  Dispense: 90 tablet; Refill: 0  3. Prediabetes: Recheck A1c, microalbumin and will refill Metformin 500 mg BID.   - HgB A1c - Urine Microalbumin w/creat. ratio - metFORMIN (GLUCOPHAGE-XR) 500 MG 24 hr tablet; TAKE 2 TABLETS(1000 MG) BY MOUTH DAILY  Dispense: 180 tablet; Refill: 1  4. Estrogen deficiency/Osteoporosis screening/Vitamin D deficiency: DEXA ordered, check vitamin D levels as well.   - DG Bone Density; Future - Vitamin D (25 hydroxy)   -USPSTF grade A and B recommendations reviewed with patient; age-appropriate recommendations, preventive care, screening tests, etc discussed and encouraged; healthy living encouraged; see AVS for patient education given to patient -Discussed importance of 150 minutes of physical activity weekly, eat two servings of fish weekly, eat one serving of tree nuts ( cashews, pistachios, pecans, almonds.Marland Kitchen) every other day, eat 6 servings of fruit/vegetables daily and drink plenty of water and avoid sweet beverages.   -Reviewed Health Maintenance: Yes.

## 2024-02-04 LAB — MICROALBUMIN / CREATININE URINE RATIO
Creatinine, Urine: 34 mg/dL (ref 20–275)
Microalb, Ur: <0.2 mg/dL

## 2024-02-04 LAB — COMPLETE METABOLIC PANEL WITH GFR
AG Ratio: 1.4 (calc) (ref 1.0–2.5)
ALT: 12 U/L (ref 6–29)
AST: 19 U/L (ref 10–35)
Albumin: 4.4 g/dL (ref 3.6–5.1)
Alkaline phosphatase (APISO): 52 U/L (ref 37–153)
BUN: 12 mg/dL (ref 7–25)
CO2: 27 mmol/L (ref 20–32)
Calcium: 9.4 mg/dL (ref 8.6–10.4)
Chloride: 104 mmol/L (ref 98–110)
Creat: 0.63 mg/dL (ref 0.50–1.05)
Globulin: 3.2 g/dL (ref 1.9–3.7)
Glucose, Bld: 94 mg/dL (ref 65–99)
Potassium: 3.9 mmol/L (ref 3.5–5.3)
Sodium: 138 mmol/L (ref 135–146)
Total Bilirubin: 0.3 mg/dL (ref 0.2–1.2)
Total Protein: 7.6 g/dL (ref 6.1–8.1)

## 2024-02-04 LAB — CBC WITH DIFFERENTIAL/PLATELET
Absolute Lymphocytes: 2280 {cells}/uL (ref 850–3900)
Absolute Monocytes: 396 {cells}/uL (ref 200–950)
Basophils Absolute: 18 {cells}/uL (ref 0–200)
Basophils Relative: 0.3 %
Eosinophils Absolute: 90 {cells}/uL (ref 15–500)
Eosinophils Relative: 1.5 %
HCT: 34.3 % — ABNORMAL LOW (ref 35.0–45.0)
Hemoglobin: 11 g/dL — ABNORMAL LOW (ref 11.7–15.5)
MCH: 25.2 pg — ABNORMAL LOW (ref 27.0–33.0)
MCHC: 32.1 g/dL (ref 32.0–36.0)
MCV: 78.7 fL — ABNORMAL LOW (ref 80.0–100.0)
Monocytes Relative: 6.6 %
Neutro Abs: 3216 {cells}/uL (ref 1500–7800)
Neutrophils Relative %: 53.6 %
Platelets: 49 10*3/uL — ABNORMAL LOW (ref 140–400)
RBC: 4.36 10*6/uL (ref 3.80–5.10)
RDW: 15.2 % — ABNORMAL HIGH (ref 11.0–15.0)
Total Lymphocyte: 38 %
WBC: 6 10*3/uL (ref 3.8–10.8)

## 2024-02-04 LAB — VITAMIN D 25 HYDROXY (VIT D DEFICIENCY, FRACTURES): Vit D, 25-Hydroxy: 40 ng/mL (ref 30–100)

## 2024-02-04 LAB — LIPID PANEL
Cholesterol: 118 mg/dL (ref <200)
HDL: 43 mg/dL — ABNORMAL LOW (ref >=50)
LDL Cholesterol (Calc): 57 mg/dL
Non-HDL Cholesterol (Calc): 75 mg/dL (ref <130)
Total CHOL/HDL Ratio: 2.7 (calc) (ref <5.0)
Triglycerides: 98 mg/dL (ref <150)

## 2024-02-04 LAB — HEMOGLOBIN A1C
Hgb A1c MFr Bld: 5.7 %{Hb} — ABNORMAL HIGH (ref <5.7)
Mean Plasma Glucose: 117 mg/dL
eAG (mmol/L): 6.5 mmol/L

## 2024-02-05 ENCOUNTER — Other Ambulatory Visit: Payer: Self-pay | Admitting: Internal Medicine

## 2024-02-05 ENCOUNTER — Other Ambulatory Visit: Payer: Self-pay | Admitting: Emergency Medicine

## 2024-02-05 ENCOUNTER — Other Ambulatory Visit
Admission: RE | Admit: 2024-02-05 | Discharge: 2024-02-05 | Disposition: A | Discharge status: Home / Self Care | Source: Ambulatory Visit | Attending: Internal Medicine | Admitting: Internal Medicine

## 2024-02-05 DIAGNOSIS — D696 Thrombocytopenia, unspecified: Secondary | ICD-10-CM

## 2024-02-05 LAB — CBC WITH DIFFERENTIAL/PLATELET
Abs Immature Granulocytes: 0.01 10*3/uL (ref 0.00–0.07)
Basophils Absolute: 0 10*3/uL (ref 0.0–0.1)
Basophils Relative: 0 %
Eosinophils Absolute: 0.1 10*3/uL (ref 0.0–0.5)
Eosinophils Relative: 2 %
HCT: 32.6 % — ABNORMAL LOW (ref 36.0–46.0)
Hemoglobin: 10.8 g/dL — ABNORMAL LOW (ref 12.0–15.0)
Immature Granulocytes: 0 %
Lymphocytes Relative: 48 %
Lymphs Abs: 2.7 10*3/uL (ref 0.7–4.0)
MCH: 26.1 pg (ref 26.0–34.0)
MCHC: 33.1 g/dL (ref 30.0–36.0)
MCV: 78.7 fL — ABNORMAL LOW (ref 80.0–100.0)
Monocytes Absolute: 0.4 10*3/uL (ref 0.1–1.0)
Monocytes Relative: 7 %
Neutro Abs: 2.5 10*3/uL (ref 1.7–7.7)
Neutrophils Relative %: 43 %
Platelets: 51 10*3/uL — ABNORMAL LOW (ref 150–400)
RBC: 4.14 MIL/uL (ref 3.87–5.11)
RDW: 15.3 % (ref 11.5–15.5)
Smear Review: NORMAL
WBC: 5.7 10*3/uL (ref 4.0–10.5)
nRBC: 0 % (ref 0.0–0.2)

## 2024-02-05 NOTE — Addendum Note (Signed)
 Addended by: Margarita Mail on: 02/05/2024 08:17 AM   Modules accepted: Orders

## 2024-02-12 ENCOUNTER — Inpatient Hospital Stay: Attending: Oncology | Admitting: Oncology

## 2024-02-12 ENCOUNTER — Inpatient Hospital Stay

## 2024-02-12 ENCOUNTER — Encounter: Payer: Self-pay | Admitting: Oncology

## 2024-02-12 VITALS — BP 107/83 | HR 82 | Temp 98.8°F | Resp 18 | Ht 60.0 in | Wt 146.9 lb

## 2024-02-12 DIAGNOSIS — Z79899 Other long term (current) drug therapy: Secondary | ICD-10-CM

## 2024-02-12 DIAGNOSIS — D696 Thrombocytopenia, unspecified: Secondary | ICD-10-CM | POA: Diagnosis not present

## 2024-02-12 DIAGNOSIS — D509 Iron deficiency anemia, unspecified: Secondary | ICD-10-CM

## 2024-02-12 DIAGNOSIS — E785 Hyperlipidemia, unspecified: Secondary | ICD-10-CM

## 2024-02-12 DIAGNOSIS — Z7984 Long term (current) use of oral hypoglycemic drugs: Secondary | ICD-10-CM

## 2024-02-12 DIAGNOSIS — E119 Type 2 diabetes mellitus without complications: Secondary | ICD-10-CM | POA: Diagnosis not present

## 2024-02-12 LAB — CBC WITH DIFFERENTIAL (CANCER CENTER ONLY)
Abs Immature Granulocytes: 0.01 10*3/uL (ref 0.00–0.07)
Basophils Absolute: 0 10*3/uL (ref 0.0–0.1)
Basophils Relative: 0 %
Eosinophils Absolute: 0.1 10*3/uL (ref 0.0–0.5)
Eosinophils Relative: 1 %
HCT: 31.7 % — ABNORMAL LOW (ref 36.0–46.0)
Hemoglobin: 10.4 g/dL — ABNORMAL LOW (ref 12.0–15.0)
Immature Granulocytes: 0 %
Lymphocytes Relative: 43 %
Lymphs Abs: 2.3 10*3/uL (ref 0.7–4.0)
MCH: 25.4 pg — ABNORMAL LOW (ref 26.0–34.0)
MCHC: 32.8 g/dL (ref 30.0–36.0)
MCV: 77.3 fL — ABNORMAL LOW (ref 80.0–100.0)
Monocytes Absolute: 0.4 10*3/uL (ref 0.1–1.0)
Monocytes Relative: 7 %
Neutro Abs: 2.5 10*3/uL (ref 1.7–7.7)
Neutrophils Relative %: 49 %
Platelet Count: 53 10*3/uL — ABNORMAL LOW (ref 150–400)
RBC: 4.1 MIL/uL (ref 3.87–5.11)
RDW: 15.1 % (ref 11.5–15.5)
Smear Review: NORMAL
WBC Count: 5.3 10*3/uL (ref 4.0–10.5)
nRBC: 0 % (ref 0.0–0.2)

## 2024-02-12 LAB — IRON AND TIBC
Iron: 56 ug/dL (ref 28–170)
Saturation Ratios: 16 % (ref 10.4–31.8)
TIBC: 349 ug/dL (ref 250–450)
UIBC: 293 ug/dL

## 2024-02-12 LAB — HEPATITIS PANEL, ACUTE
HCV Ab: NONREACTIVE
Hep A IgM: NONREACTIVE
Hep B C IgM: NONREACTIVE
Hepatitis B Surface Ag: NONREACTIVE

## 2024-02-12 LAB — PROTIME-INR
INR: 1.1 (ref 0.8–1.2)
Prothrombin Time: 13.9 s (ref 11.4–15.2)

## 2024-02-12 LAB — FOLATE: Folate: 19.5 ng/mL (ref >5.9)

## 2024-02-12 LAB — FIBRINOGEN: Fibrinogen: 463 mg/dL (ref 210–475)

## 2024-02-12 LAB — VITAMIN B12: Vitamin B-12: 253 pg/mL (ref 180–914)

## 2024-02-12 LAB — APTT: aPTT: 27 s (ref 24–36)

## 2024-02-12 LAB — FERRITIN: Ferritin: 100 ng/mL (ref 11–307)

## 2024-02-12 LAB — IMMATURE PLATELET FRACTION: Immature Platelet Fraction: 10.8 % — ABNORMAL HIGH (ref 1.2–8.6)

## 2024-02-12 LAB — HIV ANTIBODY (ROUTINE TESTING W REFLEX): HIV Screen 4th Generation wRfx: NONREACTIVE

## 2024-02-12 NOTE — Progress Notes (Signed)
 Hematology/Oncology Consult note [facility] Telephone:([phone] [phone]  Patient Care Team: Margarita Mail, DO as PCP - General (Internal Medicine)   Name of the patient: Kristine Alexander  [id number]  [birth year]    Reason for referral-thrombocytopenia   Referring physician-Elizabeth Caralee Ates, DO  Date of visit: 02/12/24   History of presenting illness- Patient is a 68 year old female with a past medical history significant for hyperlipidemia and type 2 diabetes.  She has been referred for thrombocytopenia.  CBC from 02/05/2024 showed white count of 5.7, H&H of 10.8/78.7 and a platelet count of 51.  Patient's platelet count was normal a year ago in February 2024 when it was 304.  CMP is normal and there is no known history of chronic liver disease.  Patient is able to speak and understand Hindi and I was therefore able to communicate with the patient.  She denies any over-the-counter herbal medications.  ECOG PS- 1  Pain scale- 0   Review of systems- Review of Systems  Constitutional:  Negative for chills, fever, malaise/fatigue and weight loss.  HENT:  Negative for congestion, ear discharge and nosebleeds.   Eyes:  Negative for blurred vision.  Respiratory:  Negative for cough, hemoptysis, sputum production, shortness of breath and wheezing.   Cardiovascular:  Negative for chest pain, palpitations, orthopnea and claudication.  Gastrointestinal:  Negative for abdominal pain, blood in stool, constipation, diarrhea, heartburn, melena, nausea and vomiting.  Genitourinary:  Negative for dysuria, flank pain, frequency, hematuria and urgency.  Musculoskeletal:  Negative for back pain, joint pain and myalgias.  Skin:  Negative for rash.  Neurological:  Negative for dizziness, tingling, focal weakness, seizures, weakness and headaches.  Endo/Heme/Allergies:  Does not bruise/bleed easily.  Psychiatric/Behavioral:  Negative for depression and  suicidal ideas. The patient does not have insomnia.     No Known Allergies  Patient Active Problem List   Diagnosis Date Noted   Mass of upper lobe of lung 02/29/2020   Thoracic aorta atherosclerosis (HCC) 08/17/2019   Hyperlipidemia associated with type 2 diabetes mellitus (HCC) 08/17/2019   Mediastinal lymphadenopathy 08/17/2019   Lung nodule, multiple 08/17/2019   Aortic systolic murmur on examination 08/17/2019   S/P cholecystectomy 08/17/2019   Hepatic steatosis 03/22/2019   Prediabetes 08/20/2017   Anemia 08/20/2017   Vitamin B12 deficiency 08/20/2017   Cardiac murmur 09/18/2015   Class 1 obesity with serious comorbidity and body mass index (BMI) of 33.0 to 33.9 in adult 05/16/2015   Osteoarthrosis, generalized, multiple joints 05/16/2015   Hyperlipidemia 05/16/2015     Past Medical History:  Diagnosis Date   Cholelithiases 03/22/2019   Diabetes mellitus without complication (HCC)    Hyperlipidemia    Iron deficiency anemia    Microcytosis 03/01/2017   chronic   Numbness 08/18/2017   Osteoarthritis of multiple joints    Prediabetes 08/20/2017     Past Surgical History:  Procedure Laterality Date   CATARACT EXTRACTION W/PHACO Right 02/25/2016   Procedure: CATARACT EXTRACTION PHACO AND INTRAOCULAR LENS PLACEMENT (IOC);  Surgeon: Sallee Lange, MD;  Location: [facility] ORS;  Service: Ophthalmology;  Laterality: Right;  [place]    00:48.2 AP%   23.5 CDE   22.58 fluid pack lot # [id number]   CATARACT EXTRACTION W/PHACO Left 06/08/2018   Procedure: CATARACT EXTRACTION PHACO AND INTRAOCULAR LENS PLACEMENT (IOC);  Surgeon: Galen Manila, MD;  Location: [facility] ORS;  Service: Ophthalmology;  Laterality: Left;  [place] 00:28.4 AP% 13.0 CDE 3.68 Fluid pack Lot # [id number]   CHOLECYSTECTOMY N/A  05/19/2019   Procedure: LAPAROSCOPIC CHOLECYSTECTOMY;  Surgeon: Duanne Guess, MD;  Location: [facility] ORS;  Service: General;  Laterality: N/A;   TUBAL LIGATION      Social History   Socioeconomic  History   Marital status: Married    Spouse name: Not on file   Number of children: Not on file   Years of education: Not on file   Highest education level: Not on file  Occupational History   Not on file  Tobacco Use   Smoking status: Never   Smokeless tobacco: Never  Vaping Use   Vaping status: Never Used  Substance and Sexual Activity   Alcohol use: No    Alcohol/week: 0.0 standard drinks of alcohol   Drug use: No   Sexual activity: Not Currently  Other Topics Concern   Not on file  Social History Narrative   Not on file   Social Drivers of Health   Financial Resource Strain: Low Risk  (02/03/2024)   Overall Financial Resource Strain (CARDIA)    Difficulty of Paying Living Expenses: Not hard at all  Food Insecurity: No Food Insecurity (02/03/2024)   Hunger Vital Sign    Worried About Running Out of Food in the Last Year: Never true    Ran Out of Food in the Last Year: Never true  Transportation Needs: No Transportation Needs (02/03/2024)   PRAPARE - Administrator, Civil Service (Medical): No    Lack of Transportation (Non-Medical): No  Physical Activity: Sufficiently Active (02/03/2024)   Exercise Vital Sign    Days of Exercise per Week: 5 days    Minutes of Exercise per Session: 30 min  Stress: No Stress Concern Present (02/03/2024)   [employer] of Occupational Health - Occupational Stress Questionnaire    Feeling of Stress : Only a little  Social Connections: Moderately Integrated (02/03/2024)   Social Connection and Isolation Panel [NHANES]    Frequency of Communication with Friends and Family: Once a week    Frequency of Social Gatherings with Friends and Family: Once a week    Attends Religious Services: 1 to 4 times per year    Active Member of [employer] or Organizations: Yes    Attends Banker Meetings: Never    Marital Status: Married  Catering manager Violence: Not At Risk (02/03/2024)   Humiliation, Afraid, Rape, and Kick  questionnaire    Fear of Current or Ex-Partner: No    Emotionally Abused: No    Physically Abused: No    Sexually Abused: No     Family History  Problem Relation Age of Onset   Hyperlipidemia Mother    Hyperlipidemia Father    Hyperlipidemia Sister    Diabetes Brother    Hyperlipidemia Brother      Current Outpatient Medications:    loratadine (CLARITIN) 10 MG tablet, Take 1 tablet (10 mg total) by mouth daily. (Patient not taking: Reported on 02/03/2024), Disp: 30 tablet, Rfl: 11   metFORMIN (GLUCOPHAGE-XR) 500 MG 24 hr tablet, TAKE 2 TABLETS(1000 MG) BY MOUTH DAILY, Disp: 180 tablet, Rfl: 1   rosuvastatin (CRESTOR) 5 MG tablet, Take 1 tablet (5 mg total) by mouth daily., Disp: 90 tablet, Rfl: 0   Physical exam: There were no vitals filed for this visit. Physical Exam Cardiovascular:     Rate and Rhythm: Normal rate and regular rhythm.     Heart sounds: Normal heart sounds.  Pulmonary:     Effort: Pulmonary effort is normal.  Breath sounds: Normal breath sounds.  Abdominal:     General: Bowel sounds are normal.     Palpations: Abdomen is soft.     Comments: No palpable hepatosplenomegaly  Skin:    General: Skin is warm and dry.  Neurological:     Mental Status: She is alert and oriented to person, place, and time.           Latest Ref Rng & Units 02/03/2024   10:35 AM  CMP  Glucose 65 - 99 mg/dL 94   BUN 7 - 25 mg/dL 12   Creatinine 1.61 - 1.05 mg/dL 0.96   Sodium 045 - 409 mmol/L 138   Potassium 3.5 - 5.3 mmol/L 3.9   Chloride 98 - 110 mmol/L 104   CO2 20 - 32 mmol/L 27   Calcium 8.6 - 10.4 mg/dL 9.4   Total Protein 6.1 - 8.1 g/dL 7.6   Total Bilirubin 0.2 - 1.2 mg/dL 0.3   AST 10 - 35 U/L 19   ALT 6 - 29 U/L 12       Latest Ref Rng & Units 02/05/2024   12:26 PM  CBC  WBC 4.0 - 10.5 K/uL 5.7   Hemoglobin 12.0 - 15.0 g/dL 81.1   Hematocrit 91.4 - 46.0 % 32.6   Platelets 150 - 400 K/uL 51     No images are attached to the encounter.  No results  found.  Assessment and plan- Patient is a 68 y.o. 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  I will check CBC ferritin and iron studies PT/INR fibrinogen B12 folate HIV and hepatitis C testing, immature platelet fraction today.  In person or video visit with me in 2 weeks.  Microcytic anemia: Possibly secondary to iron deficiency.  Will check iron studies today  In person or video visit with me in 2   Thank you for this kind referral and the opportunity to participate in the care of this  Patient   Visit Diagnosis 1. Thrombocytopenia (HCC)   2. Microcytic anemia     Dr. Owens Shark, MD, MPH [facility] at [facility] [phone] 02/12/2024

## 2024-02-15 LAB — H. PYLORI ANTIGEN, STOOL: H. Pylori Stool Ag, Eia: NEGATIVE

## 2024-02-29 ENCOUNTER — Inpatient Hospital Stay: Attending: Oncology | Admitting: Oncology

## 2024-02-29 DIAGNOSIS — D693 Immune thrombocytopenic purpura: Secondary | ICD-10-CM

## 2024-02-29 DIAGNOSIS — D509 Iron deficiency anemia, unspecified: Secondary | ICD-10-CM

## 2024-02-29 NOTE — Progress Notes (Signed)
 Called [employer] utilized interpreter # [phone] / Mariah Shines whom called [phone].  Spoke to caregiver / daughter Orlean Bitter.

## 2024-02-29 NOTE — Progress Notes (Signed)
 I connected with Kristine Alexander on 02/29/24 at  3:15 PM EDT by video enabled telemedicine visit and verified that I am speaking with the correct person using two identifiers.   I discussed the limitations, risks, security and privacy concerns of performing an evaluation and management service by telemedicine and the availability of in-person appointments. I also discussed with the patient that there may be a patient responsible charge related to this service. The patient expressed understanding and agreed to proceed.  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  She has been referred for thrombocytopenia.  CBC from 02/05/2024 showed white count of 5.7, H&H of 10.8/78.7 and a platelet count of 51.  Patient's platelet count was normal a year ago in February 2024 when it was 304.  CMP is normal and there is no known history of chronic liver disease.  Patient is able to speak and understand Hindi and I was therefore able to communicate with the patient.  She denies any over-the-counter herbal medications.  Results of labs from 02/12/2024 were as follows: CBC showed white count of 5.3, H&H of 10.4/31.7 with an MCV of 77 and a platelet count of 53.  Ferritin levels were normal at 100 with normal TIBC and iron saturation of 16%.  B12 levels were mildly low at 253.  H. pylori stool antigen negative.  Folate levels normal.  HIV and hepatitis C testing negative.  PT PTT INR within normal limits.  Immature platelet fraction elevated at 10.8.  Fibrinogen level is normal.    Interval history no acute issues since last visit.  Patient denies any bleeding  concerns   Review of Systems  Constitutional:  Negative for chills, fever, malaise/fatigue and weight loss.  HENT:  Negative for congestion, ear discharge and nosebleeds.   Eyes:  Negative for blurred vision.  Respiratory:  Negative for cough, hemoptysis, sputum production, shortness of breath and wheezing.   Cardiovascular:  Negative for chest pain, palpitations, orthopnea and claudication.  Gastrointestinal:  Negative for abdominal pain, blood in stool, constipation, diarrhea, heartburn, melena, nausea and vomiting.  Genitourinary:  Negative for dysuria, flank pain, frequency, hematuria and urgency.  Musculoskeletal:  Negative for back pain, joint pain and myalgias.  Skin:  Negative for rash.  Neurological:  Negative for dizziness, tingling, focal weakness, seizures, weakness and headaches.  Endo/Heme/Allergies:  Does not bruise/bleed easily.  Psychiatric/Behavioral:  Negative for depression and suicidal ideas. The patient does not have insomnia.     No Known Allergies  Past Medical History:  Diagnosis Date   Cholelithiases 03/22/2019   Diabetes mellitus without complication (HCC)    Hyperlipidemia    Iron deficiency anemia    Microcytosis 03/01/2017   chronic   Numbness 08/18/2017   Osteoarthritis of multiple joints    Prediabetes 08/20/2017    Past Surgical History:  Procedure Laterality Date   CATARACT EXTRACTION W/PHACO Right 02/25/2016   Procedure: CATARACT EXTRACTION PHACO AND INTRAOCULAR LENS PLACEMENT (IOC);  Surgeon: Sallee Lange, MD;  Location: [facility] ORS;  Service: Ophthalmology;  Laterality: Right;  [place]    00:48.2 AP%   23.5 CDE   22.58 fluid pack lot # [id number]   CATARACT EXTRACTION W/PHACO Left 06/08/2018  Procedure: CATARACT EXTRACTION PHACO AND INTRAOCULAR LENS PLACEMENT (IOC);  Surgeon: Galen Manila, MD;  Location: [facility] ORS;  Service: Ophthalmology;  Laterality: Left;  [place] 00:28.4 AP% 13.0 CDE 3.68 Fluid pack Lot # [id number]   CHOLECYSTECTOMY N/A 05/19/2019    Procedure: LAPAROSCOPIC CHOLECYSTECTOMY;  Surgeon: Duanne Guess, MD;  Location: [facility] ORS;  Service: General;  Laterality: N/A;   TUBAL LIGATION      Social History   Socioeconomic History   Marital status: Married    Spouse name: Not on file   Number of children: Not on file   Years of education: Not on file   Highest education level: Not on file  Occupational History   Not on file  Tobacco Use   Smoking status: Never   Smokeless tobacco: Never  Vaping Use   Vaping status: Never Used  Substance and Sexual Activity   Alcohol use: No    Alcohol/week: 0.0 standard drinks of alcohol   Drug use: No   Sexual activity: Not Currently  Other Topics Concern   Not on file  Social History Narrative   Not on file   Social Drivers of Health   Financial Resource Strain: Low Risk  (02/03/2024)   Overall Financial Resource Strain (CARDIA)    Difficulty of Paying Living Expenses: Not hard at all  Food Insecurity: No Food Insecurity (02/12/2024)   Hunger Vital Sign    Worried About Running Out of Food in the Last Year: Never true    Ran Out of Food in the Last Year: Never true  Transportation Needs: No Transportation Needs (02/12/2024)   PRAPARE - Administrator, Civil Service (Medical): No    Lack of Transportation (Non-Medical): No  Physical Activity: Sufficiently Active (02/03/2024)   Exercise Vital Sign    Days of Exercise per Week: 5 days    Minutes of Exercise per Session: 30 min  Stress: No Stress Concern Present (02/03/2024)   [employer] of Occupational Health - Occupational Stress Questionnaire    Feeling of Stress : Only a little  Social Connections: Moderately Integrated (02/03/2024)   Social Connection and Isolation Panel [NHANES]    Frequency of Communication with Friends and Family: Once a week    Frequency of Social Gatherings with Friends and Family: Once a week    Attends Religious Services: 1 to 4 times per year    Active Member of [employer] or  Organizations: Yes    Attends Banker Meetings: Never    Marital Status: Married  Catering manager Violence: Not At Risk (02/12/2024)   Humiliation, Afraid, Rape, and Kick questionnaire    Fear of Current or Ex-Partner: No    Emotionally Abused: No    Physically Abused: No    Sexually Abused: No    Family History  Problem Relation Age of Onset   Hyperlipidemia Mother    Hyperlipidemia Father    Hyperlipidemia Sister    Diabetes Brother    Hyperlipidemia Brother      Current Outpatient Medications:    metFORMIN (GLUCOPHAGE-XR) 500 MG 24 hr tablet, TAKE 2 TABLETS(1000 MG) BY MOUTH DAILY, Disp: 180 tablet, Rfl: 1   rosuvastatin (CRESTOR) 5 MG tablet, Take 1 tablet (5 mg total) by mouth daily., Disp: 90 tablet, Rfl: 0   loratadine (CLARITIN) 10 MG tablet, Take 1 tablet (10 mg total) by mouth daily. (Patient not taking: Reported on 02/29/2024), Disp: 30 tablet, Rfl: 11  No results found.  No images are attached to the encounter.  Latest Ref Rng & Units 02/03/2024   10:35 AM  CMP  Glucose 65 - 99 mg/dL 94   BUN 7 - 25 mg/dL 12   Creatinine 4.09 - 1.05 mg/dL 8.11   Sodium 914 - 782 mmol/L 138   Potassium 3.5 - 5.3 mmol/L 3.9   Chloride 98 - 110 mmol/L 104   CO2 20 - 32 mmol/L 27   Calcium 8.6 - 10.4 mg/dL 9.4   Total Protein 6.1 - 8.1 g/dL 7.6   Total Bilirubin 0.2 - 1.2 mg/dL 0.3   AST 10 - 35 U/L 19   ALT 6 - 29 U/L 12       Latest Ref Rng & Units 02/12/2024    2:26 PM  CBC  WBC 4.0 - 10.5 K/uL 5.3   Hemoglobin 12.0 - 15.0 g/dL 95.6   Hematocrit 21.3 - 46.0 % 31.7   Platelets 150 - 400 K/uL 53      Observation/objective: Appears in no acute distress over video visit today.  Breathing is nonlabored  Assessment and plan: Patient is a 68 year old female and this is a routine follow-up visit for following issues:  Thrombocytopenia likely secondary to acute ITP: Patient's platelet count has been around 50 for the last 1 month .  Platelet counts prior  to that a year ago were normal.  Immature platelet fraction is elevated.  This is likely consistent with ITP.  B12 levels are mildly low at 253 and I have asked her to take oral B12 (cyanacobalamin) 1000 mcg daily.  At this point ITP treatment is not indicated unless platelet counts are less than 30.  CBC with differential 2 in 4 months and I will see her back in 4 months.  2.  Microcytic anemia: Patient's baseline hemoglobin runs around 11 and presently her hemoglobin is 10.4.  Iron studies are within normal limits.  I will be checking hemoglobin electrophoresis to rule out any concomitant thalassemia given the microcytosis.  This will be done in 2 months time.  Follow-up instructions: Labs in 2 and 4 months and I will see her back in 4 months  I discussed the assessment and treatment plan with the patient. The patient was provided an opportunity to ask questions and all were answered. The patient agreed with the plan and demonstrated an understanding of the instructions.   The patient was advised to call back or seek an in-person evaluation if the symptoms worsen or if the condition fails to improve as anticipated.  I provided 11 minutes of face-to-face video visit time during this encounter, and > 50% was spent counseling as documented under my assessment & plan.  Visit Diagnosis: 1. Chronic ITP (idiopathic thrombocytopenia) (HCC)   2. Microcytic anemia     Dr. Seretha Dance, MD, MPH [facility] at [facility] Tel- [phone] 02/29/2024 3:21 PM

## 2024-03-25 ENCOUNTER — Ambulatory Visit
Admission: RE | Admit: 2024-03-25 | Discharge: 2024-03-25 | Disposition: A | Discharge status: Home / Self Care | Attending: Internal Medicine | Admitting: Internal Medicine

## 2024-03-25 ENCOUNTER — Encounter: Payer: Self-pay | Admitting: Internal Medicine

## 2024-03-25 ENCOUNTER — Ambulatory Visit
Admission: RE | Admit: 2024-03-25 | Discharge: 2024-03-25 | Disposition: A | Discharge status: Home / Self Care | Source: Ambulatory Visit | Attending: Internal Medicine | Admitting: Internal Medicine

## 2024-03-25 ENCOUNTER — Other Ambulatory Visit: Payer: Self-pay

## 2024-03-25 ENCOUNTER — Ambulatory Visit (INDEPENDENT_AMBULATORY_CARE_PROVIDER_SITE_OTHER): Admitting: Internal Medicine

## 2024-03-25 VITALS — BP 120/80 | HR 100 | Temp 98.3°F | Resp 16 | Ht 60.0 in | Wt 140.2 lb

## 2024-03-25 DIAGNOSIS — R059 Cough, unspecified: Secondary | ICD-10-CM | POA: Diagnosis not present

## 2024-03-25 DIAGNOSIS — R053 Chronic cough: Secondary | ICD-10-CM | POA: Insufficient documentation

## 2024-03-25 MED ORDER — BENZONATATE 100 MG PO CAPS: 100.0000 mg | ORAL_CAPSULE | Freq: Two times a day (BID) | ORAL | 0 refills | Status: DC | PRN

## 2024-03-25 NOTE — Progress Notes (Signed)
 Acute Office Visit  Subjective:     Patient ID: Kristine Alexander, female    DOB: [birth year], 68 y.o.   MRN: [id number]  Chief Complaint  Patient presents with   Cough    Non-productive   Fever    On and off for 5 months    Cough Associated symptoms include a fever. Pertinent negatives include no chills, sore throat, shortness of breath or wheezing.  Fever  Associated symptoms include coughing. Pertinent negatives include no congestion, sore throat or wheezing.   Patient is in today for cough and fever since January.   Discussed the use of AI scribe software for clinical note transcription with the patient, who gave verbal consent to proceed.  History of Present Illness Kristine Alexander is a 68 year old female who presents with a chronic dry cough persisting for several months.  She experiences a non-productive dry cough that worsens at night, especially when trying to sleep. There is no wheezing or shortness of breath, but forceful coughing leads to chest discomfort. She denies sinus drainage, sinus pain, or sore throat. She recalls a possible fever at some point. Over-the-counter flu and cold medications have not provided significant relief.  She has been evaluated by a hematologist for low platelet counts, which decreased from 300 to 50 over a year. Stool samples were negative for bleeding, and no infusions or interventions have been necessary.      Review of Systems  Constitutional:  Positive for fever. Negative for chills.  HENT:  Negative for congestion, sinus pain and sore throat.   Respiratory:  Positive for cough. Negative for sputum production, shortness of breath and wheezing.         Objective:    BP 120/80 (Cuff Size: Normal)   Pulse 100   Temp 98.3 F (36.8 C) (Oral)   Resp 16   Ht 5' (1.524 m)   Wt 140 lb 3.2 oz (63.6 kg)   SpO2 97%   BMI 27.38 kg/m  BP Readings from Last 3 Encounters:  03/25/24 120/80  02/12/24 107/83  02/03/24 124/82   Wt  Readings from Last 3 Encounters:  03/25/24 140 lb 3.2 oz (63.6 kg)  02/12/24 146 lb 14.4 oz (66.6 kg)  02/03/24 144 lb 11.2 oz (65.6 kg)      Physical Exam Constitutional:      Appearance: Normal appearance.  HENT:     Head: Normocephalic and atraumatic.     Right Ear: Tympanic membrane, ear canal and external ear normal.     Left Ear: Tympanic membrane, ear canal and external ear normal.     Nose: Nose normal.     Mouth/Throat:     Mouth: Mucous membranes are moist.     Pharynx: Oropharynx is clear.  Eyes:     Conjunctiva/sclera: Conjunctivae normal.  Cardiovascular:     Rate and Rhythm: Normal rate and regular rhythm.  Pulmonary:     Effort: Pulmonary effort is normal.     Breath sounds: Normal breath sounds. No wheezing, rhonchi or rales.  Skin:    General: Skin is warm and dry.  Neurological:     General: No focal deficit present.     Mental Status: She is alert. Mental status is at baseline.  Psychiatric:        Mood and Affect: Mood normal.        Behavior: Behavior normal.     No results found for any visits on 03/25/24.      Assessment &  Plan:   Assessment and Plan Assessment & Plan Chronic cough Chronic dry cough for several months, worse at night. Differential includes infection, medication side effects, sinus issues, and GERD. Infection unlikely due to chronicity. No pneumonia or wheezing noted on PE. OTC medications ineffective. - Order chest x-ray at [facility]. - Prescribe benzonatate  for cough suppression. - Consider pulmonologist referral if chest x-ray unremarkable and cough persists, would then need PFT's.  Thrombocytopenia Low platelet count confirmed, decreased from 300 to 50 over a year. Hematology evaluation ongoing with no identified cause. No bleeding evidence.  - Continue monitoring platelet levels with hematology follow-up.  - DG Chest 2 View; Future - benzonatate  (TESSALON  PERLES) 100 MG capsule; Take 1 capsule  (100 mg total) by mouth 2 (two) times daily as needed for cough.  Dispense: 20 capsule; Refill: 0   Return if symptoms worsen or fail to improve.  Rockney Cid, DO

## 2024-03-29 ENCOUNTER — Other Ambulatory Visit: Payer: Self-pay | Admitting: Internal Medicine

## 2024-03-29 ENCOUNTER — Encounter: Payer: Self-pay | Admitting: Student in an Organized Health Care Education/Training Program

## 2024-03-29 ENCOUNTER — Ambulatory Visit (INDEPENDENT_AMBULATORY_CARE_PROVIDER_SITE_OTHER): Admitting: Student in an Organized Health Care Education/Training Program

## 2024-03-29 ENCOUNTER — Ambulatory Visit: Payer: Self-pay | Admitting: Emergency Medicine

## 2024-03-29 VITALS — BP 122/68 | HR 68 | Temp 97.1°F | Ht 60.0 in | Wt 141.8 lb

## 2024-03-29 DIAGNOSIS — R053 Chronic cough: Secondary | ICD-10-CM

## 2024-03-29 NOTE — Progress Notes (Unsigned)
 Synopsis: Referred in for cough by Rockney Cid, DO  Assessment & Plan:   1. Chronic cough (Primary)  Presents for cough of 5 months in duration following an upper respiratory tract infection. Physical exam is unremarkable and there are no other findings on history to help narrow down a diagnosis. She does not have any history of asthma or respiratory illness nor is there any family history.  She does not have any smoking history but did get some exposure to biomass fuels (albeit an open area suggesting it to be minimal). She does have a history of lung nodules that were previously monitored with imaging, with last CT in 2022.  Differential diagnosis includes reactive airways for which I will order a pulmonary function testing.  Reflux associated cough is also on the differential and we will order a double contrast esophagram.  She was previously followed for a lung nodule for which screening was stopped in 2022.  I will obtain a dedicated chest CT to further work this up. Should all this workup be re-assuring, we will consider upper airway cough syndrome as an etiology, and also consider bronchoscopy for airway survey and BAL.  - Pulmonary Function Test; Future - DG ESOPHAGUS W DOUBLE CM (HD); Future - CT SUPER D CHEST WO CONTRAST; Future   Return in about 2 weeks (around 04/12/2024).  I spent 64 minutes caring for this patient today, including preparing to see the patient, obtaining a medical history , reviewing a separately obtained history, performing a medically appropriate examination and/or evaluation, counseling and educating the patient/family/caregiver, ordering medications, tests, or procedures, documenting clinical information in the electronic health record, and independently interpreting results (not separately reported/billed) and communicating results to the patient/family/caregiver  Vergia Glasgow, MD [facility]  End of visit medications:  No  orders of the defined types were placed in this encounter.    Current Outpatient Medications:    benzonatate  (TESSALON  PERLES) 100 MG capsule, Take 1 capsule (100 mg total) by mouth 2 (two) times daily as needed for cough., Disp: 20 capsule, Rfl: 0   metFORMIN  (GLUCOPHAGE -XR) 500 MG 24 hr tablet, TAKE 2 TABLETS(1000 MG) BY MOUTH DAILY, Disp: 180 tablet, Rfl: 1   rosuvastatin  (CRESTOR ) 5 MG tablet, Take 1 tablet (5 mg total) by mouth daily., Disp: 90 tablet, Rfl: 0   Subjective:   PATIENT ID: Kristine Alexander GENDER: female DOB: [birth year], MRN: [id number]  Chief Complaint  Patient presents with   Consult    Dry cough since Jan. No wheezing or SOB.     HPI  Patient is a pleasant 68 year old female presenting to clinic today for the evaluation of a chronic cough.  She was interviewed in the presence of an in-person interpreter.  Symptoms have been ongoing for about 5 months.  She had an upper respiratory tract infection in January 2025 after which the cough persisted. Her cough has been present throughout the day and is worse at night.  The cough does not wake her up from sleep.  She feels that the cough is worse early in the morning and at night.  The cough is at times productive of whitish sputum.  She denies any hemoptysis, denies any chest pain, denies chest tightness, and denies any shortness of breath.  She does not have any exertional dyspnea. She is able to do all the activities of her daily living without any limitation.  She is reporting some weight loss, mostly intentional, of around 10 pounds over the past year.  This is confirmed by her daughter who helps her with her appointments. She is reporting some fevers, up to 100.4 F.  No other significant history is reported.  Patient was seen by her primary care physician and a chest x-ray was obtained and was unremarkable.  She has been followed by hematology for thrombocytopenia for which conservative management was recommended.  She was  previously followed by oncology for lung nodule for which surveillance was eventually stopped.  Patient denies any history of smoking or vape use.  [place].  In [place], she did not work but she does report cooking with a wood oven (placed outside the house and open air).  Upon moving to [place] , she worked at [employer].  She reports there was a lot of flower dust in the air. Patient reports eating plenty of spicy foods, but has only two meals a day. Her last meal is around 4-5 pm, after which she only drinks tea. She does not notice increase in cough after meals.  Ancillary information including prior medications, full medical/surgical/family/social histories, and PFTs (when available) are listed below and have been reviewed.   Review of Systems  Constitutional:  Positive for fever and weight loss. Negative for chills.  Respiratory:  Positive for cough and sputum production. Negative for hemoptysis, shortness of breath and wheezing.   Cardiovascular:  Negative for chest pain.     Objective:   Vitals:   03/29/24 1530  BP: 122/68  Pulse: 68  Temp: (!) 97.1 F (36.2 C)  SpO2: 100%  Weight: 141 lb 12.8 oz (64.3 kg)  Height: 5' (1.524 m)   100% on RA  BMI Readings from Last 3 Encounters:  03/29/24 27.69 kg/m  03/25/24 27.38 kg/m  02/12/24 28.69 kg/m   Wt Readings from Last 3 Encounters:  03/29/24 141 lb 12.8 oz (64.3 kg)  03/25/24 140 lb 3.2 oz (63.6 kg)  02/12/24 146 lb 14.4 oz (66.6 kg)    Physical Exam Constitutional:      Appearance: Normal appearance.  Cardiovascular:     Rate and Rhythm: Normal rate and regular rhythm.  Pulmonary:     Effort: Pulmonary effort is normal.     Breath sounds: Normal breath sounds.  Musculoskeletal:     Right lower leg: No edema.     Left lower leg: No edema.  Neurological:     General: No focal deficit present.     Mental Status: She is alert and oriented to  person, place, and time. Mental status is at baseline.       Ancillary Information    Past Medical History:  Diagnosis Date   Cholelithiases 03/22/2019   Diabetes mellitus without complication (HCC)    Hyperlipidemia    Iron deficiency anemia    Microcytosis 03/01/2017   chronic   Numbness 08/18/2017   Osteoarthritis of multiple joints    Prediabetes 08/20/2017     Family History  Problem Relation Age of Onset   Hyperlipidemia Mother    Hyperlipidemia Father    Hyperlipidemia Sister    Diabetes Brother    Hyperlipidemia Brother      Past Surgical History:  Procedure Laterality Date   CATARACT EXTRACTION W/PHACO Right 02/25/2016   Procedure: CATARACT EXTRACTION PHACO AND INTRAOCULAR LENS PLACEMENT (IOC);  Surgeon: Steven Dingeldein, MD;  Location: [facility];  Service: Ophthalmology;  Laterality: Right;  US     00:48.2 AP%   23.5 CDE   22.58  fluid pack lot # [id number]   CATARACT EXTRACTION W/PHACO Left 06/08/2018   Procedure: CATARACT EXTRACTION PHACO AND INTRAOCULAR LENS PLACEMENT (IOC);  Surgeon: Clair Crews, MD;  Location: [facility] OR[facility];  Service: Ophthalmology;  Laterality: Left;  US  00:28.4 AP% 13.0 CDE 3.68 Fluid pack Lot # [id number]   CHOLECYSTECTOMY N/A 05/19/2019   Procedure: LAPAROSCOPIC CHOLECYSTECTOMY;  Surgeon: Mercy Stall, MD;  Location: [facility] OR[facility];  Service: General;  Laterality: N/A;   TUBAL LIGATION      Social History   Socioeconomic History   Marital status: Married    Spouse name: Not on file   Number of children: Not on file   Years of education: Not on file   Highest education level: Not on file  Occupational History   Not on file  Tobacco Use   Smoking status: Never   Smokeless tobacco: Never  Vaping Use   Vaping status: Never Used  Substance and Sexual Activity   Alcohol use: No    Alcohol/week: 0.0 standard drinks of alcohol   Drug use: No   Sexual activity: Not Currently  Other Topics Concern   Not on file  Social History Narrative    Not on file   Social Drivers of Health   Financial Resource Strain: Low Risk  (02/03/2024)   Overall Financial Resource Strain (CARDIA)    Difficulty of Paying Living Expenses: Not hard at all  Food Insecurity: No Food Insecurity (02/12/2024)   Hunger Vital Sign    Worried About Running Out of Food in the Last Year: Never true    Ran Out of Food in the Last Year: Never true  Transportation Needs: No Transportation Needs (02/12/2024)   PRAPARE - Administrator, Civil Service (Medical): No    Lack of Transportation (Non-Medical): No  Physical Activity: Sufficiently Active (02/03/2024)   Exercise Vital Sign    Days of Exercise per Week: 5 days    Minutes of Exercise per Session: 30 min  Stress: No Stress Concern Present (02/03/2024)   [employer] of Occupational Health - Occupational Stress Questionnaire    Feeling of Stress : Only a little  Social Connections: Moderately Integrated (02/03/2024)   Social Connection and Isolation Panel [NHANES]    Frequency of Communication with Friends and Family: Once a week    Frequency of Social Gatherings with Friends and Family: Once a week    Attends Religious Services: 1 to 4 times per year    Active Member of [employer] or Organizations: Yes    Attends Banker Meetings: Never    Marital Status: Married  Catering manager Violence: Not At Risk (02/12/2024)   Humiliation, Afraid, Rape, and Kick questionnaire    Fear of Current or Ex-Partner: No    Emotionally Abused: No    Physically Abused: No    Sexually Abused: No     No Known Allergies   CBC    Component Value Date/Time   WBC 5.3 02/12/2024 1426   WBC 5.7 02/05/2024 1226   RBC 4.10 02/12/2024 1426   HGB 10.4 (L) 02/12/2024 1426   HGB 11.9 12/26/2015 1642   HCT 31.7 (L) 02/12/2024 1426   HCT 34.9 12/26/2015 1642   PLT 53 (L) 02/12/2024 1426   PLT 234 12/26/2015 1642   MCV 77.3 (L) 02/12/2024 1426   MCV 77 (L) 12/26/2015 1642   MCH 25.4 (L) 02/12/2024  1426   MCHC 32.8 02/12/2024 1426   RDW 15.1 02/12/2024 1426   RDW 14.7 12/26/2015  1642   LYMPHSABS 2.3 02/12/2024 1426   LYMPHSABS 3.6 (H) 12/26/2015 1642   MONOABS 0.4 02/12/2024 1426   EOSABS 0.1 02/12/2024 1426   EOSABS 0.1 12/26/2015 1642   BASOSABS 0.0 02/12/2024 1426   BASOSABS 0.0 12/26/2015 1642    Pulmonary Functions Testing Results:     No data to display          Outpatient Medications Prior to Visit  Medication Sig Dispense Refill   benzonatate  (TESSALON  PERLES) 100 MG capsule Take 1 capsule (100 mg total) by mouth 2 (two) times daily as needed for cough. 20 capsule 0   metFORMIN  (GLUCOPHAGE -XR) 500 MG 24 hr tablet TAKE 2 TABLETS(1000 MG) BY MOUTH DAILY 180 tablet 1   rosuvastatin  (CRESTOR ) 5 MG tablet Take 1 tablet (5 mg total) by mouth daily. 90 tablet 0   No facility-administered medications prior to visit.

## 2024-04-04 ENCOUNTER — Inpatient Hospital Stay: Admission: RE | Admit: 2024-04-04 | Source: Ambulatory Visit

## 2024-04-08 ENCOUNTER — Telehealth: Payer: Self-pay | Admitting: Student in an Organized Health Care Education/Training Program

## 2024-04-08 NOTE — Telephone Encounter (Signed)
 We received a denial letter from the patient's insurance for the Chest CT you ordered. The letter states your doctor told us  you have a cough. We cannot approve the request for the CT. You must have completed a 3 week trial of drugs that treat a congested chest and allergies. You must have results of PFT supporting imaging. Imaging can be done after you tried one of these treatments for at least 3 weeks 1 oral or inhaled steroids to reduce cough 2 drugs that block the effect of leukotrienes, 3 you must have been treated fro GERD

## 2024-04-14 ENCOUNTER — Ambulatory Visit

## 2024-04-14 ENCOUNTER — Other Ambulatory Visit

## 2024-04-20 ENCOUNTER — Encounter

## 2024-04-20 ENCOUNTER — Ambulatory Visit: Admitting: Student in an Organized Health Care Education/Training Program

## 2024-04-29 ENCOUNTER — Inpatient Hospital Stay

## 2024-05-04 ENCOUNTER — Other Ambulatory Visit: Payer: Self-pay

## 2024-05-04 ENCOUNTER — Inpatient Hospital Stay: Attending: Oncology

## 2024-05-04 DIAGNOSIS — D693 Immune thrombocytopenic purpura: Secondary | ICD-10-CM

## 2024-05-04 DIAGNOSIS — D696 Thrombocytopenia, unspecified: Secondary | ICD-10-CM | POA: Insufficient documentation

## 2024-05-04 DIAGNOSIS — E538 Deficiency of other specified B group vitamins: Secondary | ICD-10-CM

## 2024-05-04 DIAGNOSIS — D649 Anemia, unspecified: Secondary | ICD-10-CM

## 2024-05-04 DIAGNOSIS — D509 Iron deficiency anemia, unspecified: Secondary | ICD-10-CM

## 2024-05-04 LAB — CBC WITH DIFFERENTIAL (CANCER CENTER ONLY)
Abs Immature Granulocytes: 0.02 10*3/uL (ref 0.00–0.07)
Basophils Absolute: 0 10*3/uL (ref 0.0–0.1)
Basophils Relative: 0 %
Eosinophils Absolute: 0.1 10*3/uL (ref 0.0–0.5)
Eosinophils Relative: 2 %
HCT: 32.8 % — ABNORMAL LOW (ref 36.0–46.0)
Hemoglobin: 10.6 g/dL — ABNORMAL LOW (ref 12.0–15.0)
Immature Granulocytes: 0 %
Lymphocytes Relative: 41 %
Lymphs Abs: 2.5 10*3/uL (ref 0.7–4.0)
MCH: 24.7 pg — ABNORMAL LOW (ref 26.0–34.0)
MCHC: 32.3 g/dL (ref 30.0–36.0)
MCV: 76.3 fL — ABNORMAL LOW (ref 80.0–100.0)
Monocytes Absolute: 0.3 10*3/uL (ref 0.1–1.0)
Monocytes Relative: 5 %
Neutro Abs: 3.1 10*3/uL (ref 1.7–7.7)
Neutrophils Relative %: 52 %
Platelet Count: 66 10*3/uL — ABNORMAL LOW (ref 150–400)
RBC: 4.3 MIL/uL (ref 3.87–5.11)
RDW: 16.4 % — ABNORMAL HIGH (ref 11.5–15.5)
WBC Count: 6.1 10*3/uL (ref 4.0–10.5)
nRBC: 0 % (ref 0.0–0.2)

## 2024-05-04 LAB — VITAMIN B12: Vitamin B-12: 467 pg/mL (ref 180–914)

## 2024-05-05 ENCOUNTER — Inpatient Hospital Stay: Attending: Oncology

## 2024-05-05 LAB — KAPPA/LAMBDA LIGHT CHAINS
Kappa free light chain: 42.8 mg/L — ABNORMAL HIGH (ref 3.3–19.4)
Kappa, lambda light chain ratio: 1.26 (ref 0.26–1.65)
Lambda free light chains: 34 mg/L — ABNORMAL HIGH (ref 5.7–26.3)

## 2024-05-06 LAB — MULTIPLE MYELOMA PANEL, SERUM
Albumin SerPl Elph-Mcnc: 4.1 g/dL (ref 2.9–4.4)
Albumin/Glob SerPl: 1.3 (ref 0.7–1.7)
Alpha 1: 0.1 g/dL (ref 0.0–0.4)
Alpha2 Glob SerPl Elph-Mcnc: 0.6 g/dL (ref 0.4–1.0)
B-Globulin SerPl Elph-Mcnc: 1 g/dL (ref 0.7–1.3)
Gamma Glob SerPl Elph-Mcnc: 1.7 g/dL (ref 0.4–1.8)
Globulin, Total: 3.4 g/dL (ref 2.2–3.9)
IgA: 316 mg/dL (ref 87–352)
IgG (Immunoglobin G), Serum: 1841 mg/dL — ABNORMAL HIGH (ref 586–1602)
IgM (Immunoglobulin M), Srm: 116 mg/dL (ref 26–217)
Total Protein ELP: 7.5 g/dL (ref 6.0–8.5)

## 2024-05-06 LAB — HGB FRACTIONATION CASCADE
Hgb A2: 2.6 % (ref 1.8–3.2)
Hgb A: 97.4 % (ref 96.4–98.8)
Hgb F: 0 % (ref 0.0–2.0)
Hgb S: 0 %

## 2024-06-04 ENCOUNTER — Other Ambulatory Visit: Payer: Self-pay | Admitting: Internal Medicine

## 2024-06-04 DIAGNOSIS — E785 Hyperlipidemia, unspecified: Secondary | ICD-10-CM

## 2024-06-06 NOTE — Telephone Encounter (Signed)
 Requested Prescriptions  Pending Prescriptions Disp Refills   rosuvastatin  (CRESTOR ) 5 MG tablet [Pharmacy Med Name: ROSUVASTATIN  CALCIUM  5 MG TAB] 90 tablet 2    Sig: TAKE 1 TABLET (5 MG TOTAL) BY MOUTH DAILY.     Cardiovascular:  Antilipid - Statins 2 Failed - 06/06/2024  4:46 PM      Failed - Lipid Panel in normal range within the last 12 months    Cholesterol, Total  Date Value Ref Range Status  09/19/2015 178 100 - 199 mg/dL Final   Cholesterol  Date Value Ref Range Status  02/03/2024 118 <200 mg/dL Final   LDL Cholesterol (Calc)  Date Value Ref Range Status  02/03/2024 57 mg/dL (calc) Final    Comment:    Reference range: <100 . Desirable range <100 mg/dL for primary prevention;   <70 mg/dL for patients with CHD or diabetic patients  with > or = 2 CHD risk factors. SABRA LDL-C is now calculated using the Martin-Hopkins  calculation, which is a validated novel method providing  better accuracy than the Friedewald equation in the  estimation of LDL-C.  Gladis APPLETHWAITE et al. SANDREA. 7986;689(80): 2061-2068  (http://education.[facility].com/faq/FAQ164)    HDL  Date Value Ref Range Status  02/03/2024 43 (L) > OR = 50 mg/dL Final  88/97/7983 47 >60 mg/dL Final    Comment:    According to ATP-III Guidelines, HDL-C >59 mg/dL is considered a negative risk factor for CHD.    Triglycerides  Date Value Ref Range Status  02/03/2024 98 <150 mg/dL Final         Passed - Cr in normal range and within 360 days    Creat  Date Value Ref Range Status  02/03/2024 0.63 0.50 - 1.05 mg/dL Final   Creatinine, Urine  Date Value Ref Range Status  02/03/2024 34 20 - 275 mg/dL Final         Passed - Patient is not pregnant      Passed - Valid encounter within last 12 months    Recent Outpatient Visits           2 months ago Chronic cough   [facility] Bernardo Fend, DO   4 months ago Annual physical exam   [facility]  Bernardo Fend, DO       Future Appointments             In 2 months Bernardo Fend, DO [facility], [facility]

## 2024-07-01 ENCOUNTER — Inpatient Hospital Stay: Admitting: Oncology

## 2024-07-01 ENCOUNTER — Inpatient Hospital Stay

## 2024-07-14 NOTE — Telephone Encounter (Signed)
 Insurance denied the CT scheduled on 03/29/24 and 04/14/24. Patient CXL appts on 04/14/24 for swallowing test. CXL appt on 04/20/24 for PFT and ROV

## 2024-08-05 ENCOUNTER — Other Ambulatory Visit: Payer: Self-pay | Admitting: Internal Medicine

## 2024-08-05 ENCOUNTER — Encounter: Payer: Self-pay | Admitting: Internal Medicine

## 2024-08-05 ENCOUNTER — Ambulatory Visit (INDEPENDENT_AMBULATORY_CARE_PROVIDER_SITE_OTHER): Admitting: Internal Medicine

## 2024-08-05 ENCOUNTER — Other Ambulatory Visit: Payer: Self-pay

## 2024-08-05 VITALS — BP 118/84 | HR 82 | Temp 97.8°F | Resp 14 | Ht 60.0 in | Wt 139.9 lb

## 2024-08-05 DIAGNOSIS — R7303 Prediabetes: Secondary | ICD-10-CM

## 2024-08-05 DIAGNOSIS — D509 Iron deficiency anemia, unspecified: Secondary | ICD-10-CM | POA: Diagnosis not present

## 2024-08-05 DIAGNOSIS — E785 Hyperlipidemia, unspecified: Secondary | ICD-10-CM | POA: Diagnosis not present

## 2024-08-05 LAB — POCT GLYCOSYLATED HEMOGLOBIN (HGB A1C): Hemoglobin A1C: 5.9 % — AB (ref 4.0–5.6)

## 2024-08-05 MED ORDER — FERROUS GLUCONATE 324 (38 FE) MG PO TABS: 324.0000 mg | ORAL_TABLET | ORAL | 1 refills | Status: AC

## 2024-08-05 MED ORDER — METFORMIN HCL ER 500 MG PO TB24: 500.0000 mg | ORAL_TABLET | Freq: Every day | ORAL | 1 refills | Status: DC

## 2024-08-05 NOTE — Telephone Encounter (Signed)
 Requested medications are due for refill today.  See note from pharmacy  Requested medications are on the active medications list.  yes  Last refill. 08/05/2024  Future visit scheduled.     Notes to clinic.  Pharmacy comment: Script Clarification:PLEASE CLARIFY THE DIRECTIONS.     Requested Prescriptions  Pending Prescriptions Disp Refills   metFORMIN  (GLUCOPHAGE -XR) 500 MG 24 hr tablet [Pharmacy Med Name: METFORMIN  HCL ER 500 MG TABLET] 90 tablet 1    Sig: Take 1 tablet (500 mg total) by mouth daily with breakfast. TAKE 2 TABLETS(1000 MG) BY MOUTH DAILY     Endocrinology:  Diabetes - Biguanides Failed - 08/05/2024  4:28 PM      Failed - eGFR in normal range and within 360 days    GFR, Est African American  Date Value Ref Range Status  03/12/2021 108 > OR = 60 mL/min/1.40m2 Final   GFR, Est Non African American  Date Value Ref Range Status  03/12/2021 93 > OR = 60 mL/min/1.55m2 Final   GFR, Estimated  Date Value Ref Range Status  01/13/2023 >60 >60 mL/min Final    Comment:    (NOTE) Calculated using the CKD-EPI Creatinine Equation (2021)    eGFR  Date Value Ref Range Status  06/04/2023 96 > OR = 60 mL/min/1.71m2 Final         Passed - Cr in normal range and within 360 days    Creat  Date Value Ref Range Status  02/03/2024 0.63 0.50 - 1.05 mg/dL Final   Creatinine, Urine  Date Value Ref Range Status  02/03/2024 34 20 - 275 mg/dL Final         Passed - HBA1C is between 0 and 7.9 and within 180 days    Hemoglobin A1C  Date Value Ref Range Status  08/05/2024 5.9 (A) 4.0 - 5.6 % Final   Hgb A1c MFr Bld  Date Value Ref Range Status  02/03/2024 5.7 (H) <5.7 % of total Hgb Final    Comment:    For someone without known diabetes, a hemoglobin  A1c value between 5.7% and 6.4% is consistent with prediabetes and should be confirmed with a  follow-up test. . For someone with known diabetes, a value <7% indicates that their diabetes is well controlled. A1c targets  should be individualized based on duration of diabetes, age, comorbid conditions, and other considerations. . This assay result is consistent with an increased risk of diabetes. . Currently, no consensus exists regarding use of hemoglobin A1c for diagnosis of diabetes for children. .          Passed - B12 Level in normal range and within 720 days    Vitamin B-12  Date Value Ref Range Status  05/04/2024 467 180 - 914 pg/mL Final    Comment:    (NOTE) This assay is not validated for testing neonatal or myeloproliferative syndrome specimens for Vitamin B12 levels. Performed at [facility], [address] [place], [place] [address]          Passed - Valid encounter within last 6 months    Recent Outpatient Visits           Today Prediabetes   [facility] Bernardo Fend, DO   4 months ago Chronic cough   [facility] Bernardo Fend, DO   6 months ago Annual physical exam   [facility] Bernardo Fend, [place]  Passed - CBC within normal limits and completed in the last 12 months    WBC  Date Value Ref Range Status  02/05/2024 5.7 4.0 - 10.5 K/uL Final   WBC Count  Date Value Ref Range Status  05/04/2024 6.1 4.0 - 10.5 K/uL Final   RBC  Date Value Ref Range Status  05/04/2024 4.30 3.87 - 5.11 MIL/uL Final   Hemoglobin  Date Value Ref Range Status  05/04/2024 10.6 (L) 12.0 - 15.0 g/dL Final    Comment:    Reticulocyte Hemoglobin testing may be clinically indicated, consider ordering this additional test [id number]   12/26/2015 11.9 11.1 - 15.9 g/dL Final   HCT  Date Value Ref Range Status  05/04/2024 32.8 (L) 36.0 - 46.0 % Final   Hematocrit  Date Value Ref Range Status  12/26/2015 34.9 34.0 - 46.6 % Final   MCHC  Date Value Ref Range Status  05/04/2024 32.3 30.0 - 36.0 g/dL Final   [facility]  Date Value Ref Range Status  05/04/2024 24.7 (L) 26.0 -  34.0 pg Final   MCV  Date Value Ref Range Status  05/04/2024 76.3 (L) 80.0 - 100.0 fL Final  12/26/2015 77 (L) 79 - 97 fL Final   No results found for: PLTCOUNTKUC, LABPLAT, POCPLA RDW  Date Value Ref Range Status  05/04/2024 16.4 (H) 11.5 - 15.5 % Final  12/26/2015 14.7 12.3 - 15.4 % Final

## 2024-08-05 NOTE — Progress Notes (Signed)
 Established Patient Office Visit  Subjective   Patient ID: Kristine Alexander, female    DOB: 06-Jan-1956  Age: 68 y.o. MRN: [id number]  Chief Complaint  Patient presents with   Medical Management of Chronic Issues    6 month recheck    HPI  Patient is here for follow up on chronic medical conditions. She is here with her daughter.   Discussed the use of AI scribe software for clinical note transcription with the patient, who gave verbal consent to proceed.  History of Present Illness Kristine Alexander is a 68 year old female with prediabetes and iron deficiency anemia who presents for a follow-up visit.  Her prediabetes is managed with metformin  1000 mg daily, and her recent A1c is 5.9, slightly increased from 5.7. Her weight remains stable at 139 pounds.  Recent blood work in [place] indicated slight anemia and iron deficiency, with improved platelet count now at 160.  She experiences hand numbness, though B12 and vitamin D  levels are normal. Inflammatory markers are slightly elevated, but tests for autoimmune conditions are negative.   Pre-Diabetes: -Last A1c 5.7 3/25 -Medications: Metformin  500 mg XR BID -Patient is compliant with the above medications and reports no side effects.  -Checking BG at home: Not checking -Eye exam: UTD -Foot exam: Due -Statin: Yes -PNA vaccine: Yes -Denies symptoms of hypoglycemia, polyuria, polydipsia, numbness extremities, foot ulcers/trauma.   HLD: -Medications: Crestor  5 mg - had Lipitor but this was switched due to muscle pains  -Patient is compliant with above medications and reports no side effects.  -Last lipid panel: Lipid Panel     Component Value Date/Time   CHOL 118 02/03/2024 1035   CHOL 178 09/19/2015 0853   TRIG 98 02/03/2024 1035   HDL 43 (L) 02/03/2024 1035   HDL 47 09/19/2015 0853   CHOLHDL 2.7 02/03/2024 1035   VLDL 10 01/30/2017 1613   LDLCALC 57 02/03/2024 1035   LABVLDL 13 09/19/2015 0853   The ASCVD Risk score  (Arnett DK, et al., 2019) failed to calculate for the following reasons:   The valid total cholesterol range is 130 to 320 mg/dL  Health maintenance: -Blood work UTD -Patient declines all further cancer screenings.   Patient Active Problem List   Diagnosis Date Noted   Mass of upper lobe of lung 02/29/2020   Thoracic aorta atherosclerosis (HCC) 08/17/2019   Hyperlipidemia associated with type 2 diabetes mellitus (HCC) 08/17/2019   Mediastinal lymphadenopathy 08/17/2019   Lung nodule, multiple 08/17/2019   Aortic systolic murmur on examination 08/17/2019   S/P cholecystectomy 08/17/2019   Hepatic steatosis 03/22/2019   Prediabetes 08/20/2017   Anemia 08/20/2017   Vitamin B12 deficiency 08/20/2017   Cardiac murmur 09/18/2015   Class 1 obesity with serious comorbidity and body mass index (BMI) of 33.0 to 33.9 in adult 05/16/2015   Osteoarthrosis, generalized, multiple joints 05/16/2015   Hyperlipidemia 05/16/2015   Past Medical History:  Diagnosis Date   Cholelithiases 03/22/2019   Diabetes mellitus without complication (HCC)    Hyperlipidemia    Iron deficiency anemia    Microcytosis 03/01/2017   chronic   Numbness 08/18/2017   Osteoarthritis of multiple joints    Prediabetes 08/20/2017   Past Surgical History:  Procedure Laterality Date   CATARACT EXTRACTION W/PHACO Right 02/25/2016   Procedure: CATARACT EXTRACTION PHACO AND INTRAOCULAR LENS PLACEMENT (IOC);  Surgeon: Steven Dingeldein, MD;  Location: [facility];  Service: Ophthalmology;  Laterality: Right;  US     00:48.2 AP%   23.5 CDE  22.58 fluid pack lot # [id number]   CATARACT EXTRACTION W/PHACO Left 06/08/2018   Procedure: CATARACT EXTRACTION PHACO AND INTRAOCULAR LENS PLACEMENT (IOC);  Surgeon: Jaye Fallow, MD;  Location: [facility];  Service: Ophthalmology;  Laterality: Left;  US  00:28.4 AP% 13.0 CDE 3.68 Fluid pack Lot # [id number]   CHOLECYSTECTOMY N/A 05/19/2019   Procedure: LAPAROSCOPIC CHOLECYSTECTOMY;  Surgeon:  Marolyn Nest, MD;  Location: [facility];  Service: General;  Laterality: N/A;   TUBAL LIGATION     Social History   Tobacco Use   Smoking status: Never   Smokeless tobacco: Never  Vaping Use   Vaping status: Never Used  Substance Use Topics   Alcohol use: No    Alcohol/week: 0.0 standard drinks of alcohol   Drug use: No   Social History   Socioeconomic History   Marital status: Married    Spouse name: Not on file   Number of children: Not on file   Years of education: Not on file   Highest education level: Not on file  Occupational History   Not on file  Tobacco Use   Smoking status: Never   Smokeless tobacco: Never  Vaping Use   Vaping status: Never Used  Substance and Sexual Activity   Alcohol use: No    Alcohol/week: 0.0 standard drinks of alcohol   Drug use: No   Sexual activity: Not Currently  Other Topics Concern   Not on file  Social History Narrative   Not on file   Social Drivers of Health   Financial Resource Strain: Low Risk  (02/03/2024)   Overall Financial Resource Strain (CARDIA)    Difficulty of Paying Living Expenses: Not hard at all  Food Insecurity: No Food Insecurity (02/12/2024)   Hunger Vital Sign    Worried About Running Out of Food in the Last Year: Never true    Ran Out of Food in the Last Year: Never true  Transportation Needs: No Transportation Needs (02/12/2024)   PRAPARE - Administrator, Civil Service (Medical): No    Lack of Transportation (Non-Medical): No  Physical Activity: Sufficiently Active (02/03/2024)   Exercise Vital Sign    Days of Exercise per Week: 5 days    Minutes of Exercise per Session: 30 min  Stress: No Stress Concern Present (02/03/2024)   [employer] of Occupational Health - Occupational Stress Questionnaire    Feeling of Stress : Only a little  Social Connections: Moderately Integrated (02/03/2024)   Social Connection and Isolation Panel    Frequency of Communication with Friends and Family:  Once a week    Frequency of Social Gatherings with Friends and Family: Once a week    Attends Religious Services: 1 to 4 times per year    Active Member of [employer] or Organizations: Yes    Attends Banker Meetings: Never    Marital Status: Married  Catering manager Violence: Not At Risk (02/12/2024)   Humiliation, Afraid, Rape, and Kick questionnaire    Fear of Current or Ex-Partner: No    Emotionally Abused: No    Physically Abused: No    Sexually Abused: No   Family Status  Relation Name Status   Mother  Deceased   Father  Deceased   Sister 1 Alive   Brother  (Not Specified)   Daughter  Alive   Son  Alive   MGM  Deceased   MGF  Deceased   PGM  Deceased   PGF  Deceased   Daughter  Alive   Son  Alive   Son  Alive  No partnership data on file   Family History  Problem Relation Age of Onset   Hyperlipidemia Mother    Hyperlipidemia Father    Hyperlipidemia Sister    Diabetes Brother    Hyperlipidemia Brother    No Known Allergies    Review of Systems  Respiratory:  Negative for cough.   All other systems reviewed and are negative.     Objective:     BP 118/84   Pulse 82   Temp 97.8 F (36.6 C) (Oral)   Resp 14   Ht 5' (1.524 m)   Wt 139 lb 14.4 oz (63.5 kg)   SpO2 96%   BMI 27.32 kg/m  BP Readings from Last 3 Encounters:  08/05/24 118/84  03/29/24 122/68  03/25/24 120/80   Wt Readings from Last 3 Encounters:  08/05/24 139 lb 14.4 oz (63.5 kg)  03/29/24 141 lb 12.8 oz (64.3 kg)  03/25/24 140 lb 3.2 oz (63.6 kg)      Physical Exam Constitutional:      Appearance: Normal appearance.  HENT:     Head: Normocephalic and atraumatic.  Eyes:     Conjunctiva/sclera: Conjunctivae normal.  Cardiovascular:     Rate and Rhythm: Normal rate and regular rhythm.     Pulses:          Dorsalis pedis pulses are 2+ on the right side and 2+ on the left side.  Pulmonary:     Effort: Pulmonary effort is normal.     Breath sounds: Normal breath  sounds.  Musculoskeletal:     Right foot: Normal range of motion. No deformity, bunion, Charcot foot, foot drop or prominent metatarsal heads.     Left foot: Normal range of motion. No deformity, bunion, Charcot foot, foot drop or prominent metatarsal heads.  Feet:     Right foot:     Protective Sensation: 6 sites tested.  6 sites sensed.     Skin integrity: Skin integrity normal.     Toenail Condition: Right toenails are normal.     Left foot:     Protective Sensation: 6 sites tested.  6 sites sensed.     Skin integrity: Skin integrity normal.     Toenail Condition: Left toenails are normal.  Skin:    General: Skin is warm and dry.  Neurological:     General: No focal deficit present.     Mental Status: She is alert. Mental status is at baseline.  Psychiatric:        Mood and Affect: Mood normal.        Behavior: Behavior normal.    Results for orders placed or performed in visit on 08/05/24  POCT HgB A1C  Result Value Ref Range   Hemoglobin A1C 5.9 (A) 4.0 - 5.6 %   HbA1c POC (<> result, manual entry)     HbA1c, POC (prediabetic range)     HbA1c, POC (controlled diabetic range)      Last CBC Lab Results  Component Value Date   WBC 6.1 05/04/2024   HGB 10.6 (L) 05/04/2024   HCT 32.8 (L) 05/04/2024   MCV 76.3 (L) 05/04/2024   MCH 24.7 (L) 05/04/2024   RDW 16.4 (H) 05/04/2024   PLT 66 (L) 05/04/2024   Last metabolic panel Lab Results  Component Value Date   GLUCOSE 94 02/03/2024   NA 138 02/03/2024   K 3.9 02/03/2024   CL 104 02/03/2024  CO2 27 02/03/2024   BUN 12 02/03/2024   CREATININE 0.63 02/03/2024   EGFR 96 06/04/2023   CALCIUM  9.4 02/03/2024   PROT 7.6 02/03/2024   ALBUMIN 3.6 05/31/2019   LABGLOB 3.4 05/04/2024   AGRATIO 1.2 09/19/2015   BILITOT 0.3 02/03/2024   ALKPHOS 89 05/31/2019   AST 19 02/03/2024   ALT 12 02/03/2024   ANIONGAP 10 01/13/2023   Last lipids Lab Results  Component Value Date   CHOL 118 02/03/2024   HDL 43 (L) 02/03/2024    LDLCALC 57 02/03/2024   TRIG 98 02/03/2024   CHOLHDL 2.7 02/03/2024   Last hemoglobin A1c Lab Results  Component Value Date   HGBA1C 5.9 (A) 08/05/2024   Last thyroid  functions Lab Results  Component Value Date   TSH 3.96 06/04/2023   Last vitamin D  Lab Results  Component Value Date   VD25OH 40 02/03/2024   Last vitamin B12 and Folate Lab Results  Component Value Date   VITAMINB12 467 05/04/2024   FOLATE 19.5 02/12/2024      The ASCVD Risk score (Arnett DK, et al., 2019) failed to calculate for the following reasons:   The valid total cholesterol range is 130 to 320 mg/dL    Assessment & Plan:   Assessment & Plan Iron deficiency anemia Mild iron deficiency anemia likely due to frequent blood draws. Symptoms include fatigue and possible hand numbness. - Prescribed over-the-counter iron supplement every other day. - Advised taking iron with food to minimize nausea. - Informed about potential side effects of iron supplements, including constipation and darker stools.  Pre-Diabetes Blood sugar well-controlled with A1c of 5.9%. Current management with metformin  is effective. - Reduce metformin  dose to 500 mg once daily with breakfast due to patient's wishes. - Continue monitoring blood glucose levels.  Hyperlipidemia Hyperlipidemia well-managed with current medication. LDL cholesterol at 81 indicates effective control. - Continue current cholesterol medication regimen.  - POCT HgB A1C - HM Diabetes Foot Exam - metFORMIN  (GLUCOPHAGE -XR) 500 MG 24 hr tablet; Take 1 tablet (500 mg total) by mouth daily with breakfast. TAKE 2 TABLETS(1000 MG) BY MOUTH DAILY  Dispense: 90 tablet; Refill: 1 - ferrous gluconate  (FERGON) 324 MG tablet; Take 1 tablet (324 mg total) by mouth every other day.  Dispense: 90 tablet; Refill: 1   Return in about 6 months (around 02/02/2025).    Sharyle Fischer, DO

## 2024-10-06 ENCOUNTER — Ambulatory Visit: Admitting: Internal Medicine

## 2024-10-14 ENCOUNTER — Telehealth: Payer: Self-pay | Admitting: Internal Medicine

## 2024-11-06 ENCOUNTER — Other Ambulatory Visit: Payer: Self-pay | Admitting: Internal Medicine

## 2024-11-06 DIAGNOSIS — R7303 Prediabetes: Secondary | ICD-10-CM

## 2024-11-09 NOTE — Telephone Encounter (Signed)
 Refilled 08/08/24 # 90 with 1 refill. Requested Prescriptions  Refused Prescriptions Disp Refills   metFORMIN  (GLUCOPHAGE -XR) 500 MG 24 hr tablet [Pharmacy Med Name: METFORMIN  HCL ER 500 MG TABLET] 60 tablet 5    Sig: TAKE 2 TABLETS(1000 MG) BY MOUTH DAILY     Endocrinology:  Diabetes - Biguanides Failed - 11/09/2024  9:55 AM      Failed - eGFR in normal range and within 360 days    GFR, Est African American  Date Value Ref Range Status  03/12/2021 108 > OR = 60 mL/min/1.64m2 Final   GFR, Est Non African American  Date Value Ref Range Status  03/12/2021 93 > OR = 60 mL/min/1.43m2 Final   GFR, Estimated  Date Value Ref Range Status  01/13/2023 >60 >60 mL/min Final    Comment:    (NOTE) Calculated using the CKD-EPI Creatinine Equation (2021)    eGFR  Date Value Ref Range Status  06/04/2023 96 > OR = 60 mL/min/1.23m2 Final         Passed - Cr in normal range and within 360 days    Creat  Date Value Ref Range Status  02/03/2024 0.63 0.50 - 1.05 mg/dL Final   Creatinine, Urine  Date Value Ref Range Status  02/03/2024 34 20 - 275 mg/dL Final         Passed - HBA1C is between 0 and 7.9 and within 180 days    Hemoglobin A1C  Date Value Ref Range Status  08/05/2024 5.9 (A) 4.0 - 5.6 % Final   Hgb A1c MFr Bld  Date Value Ref Range Status  02/03/2024 5.7 (H) <5.7 % of total Hgb Final    Comment:    For someone without known diabetes, a hemoglobin  A1c value between 5.7% and 6.4% is consistent with prediabetes and should be confirmed with a  follow-up test. . For someone with known diabetes, a value <7% indicates that their diabetes is well controlled. A1c targets should be individualized based on duration of diabetes, age, comorbid conditions, and other considerations. . This assay result is consistent with an increased risk of diabetes. . Currently, no consensus exists regarding use of hemoglobin A1c for diagnosis of diabetes for children. .          Passed -  B12 Level in normal range and within 720 days    Vitamin B-12  Date Value Ref Range Status  05/04/2024 467 180 - 914 pg/mL Final    Comment:    (NOTE) This assay is not validated for testing neonatal or myeloproliferative syndrome specimens for Vitamin B12 levels. Performed at [facility], [address], [place], [place] [address]          Passed - Valid encounter within last 6 months    Recent Outpatient Visits           3 months ago Prediabetes   [facility] Bernardo Fend, [place]   7 months ago Chronic cough   [facility] Bernardo Fend, DO   9 months ago Annual physical exam   [facility] Bernardo Fend, [place]              Passed - CBC within normal limits and completed in the last 12 months    WBC  Date Value Ref Range Status  02/05/2024 5.7 4.0 - 10.5 K/uL Final   WBC Count  Date Value Ref Range Status  05/04/2024 6.1 4.0 - 10.5  K/uL Final   RBC  Date Value Ref Range Status  05/04/2024 4.30 3.87 - 5.11 MIL/uL Final   Hemoglobin  Date Value Ref Range Status  05/04/2024 10.6 (L) 12.0 - 15.0 g/dL Final    Comment:    Reticulocyte Hemoglobin testing may be clinically indicated, consider ordering this additional test [id number]   12/26/2015 11.9 11.1 - 15.9 g/dL Final   HCT  Date Value Ref Range Status  05/04/2024 32.8 (L) 36.0 - 46.0 % Final   Hematocrit  Date Value Ref Range Status  12/26/2015 34.9 34.0 - 46.6 % Final   MCHC  Date Value Ref Range Status  05/04/2024 32.3 30.0 - 36.0 g/dL Final   [facility]  Date Value Ref Range Status  05/04/2024 24.7 (L) 26.0 - 34.0 pg Final   MCV  Date Value Ref Range Status  05/04/2024 76.3 (L) 80.0 - 100.0 fL Final  12/26/2015 77 (L) 79 - 97 fL Final   No results found for: PLTCOUNTKUC, LABPLAT, POCPLA RDW  Date Value Ref Range Status  05/04/2024 16.4 (H) 11.5 - 15.5 % Final  12/26/2015 14.7 12.3 - 15.4 % Final

## 2025-02-02 ENCOUNTER — Ambulatory Visit: Admitting: Internal Medicine
